# Patient Record
Sex: Male | Born: 1950 | Race: Black or African American | Hispanic: No | Marital: Single | Smoking: Never smoker
Health system: Southern US, Community
[De-identification: ages and names within clinical notes are randomized; demographics above are authoritative.]

## PROBLEM LIST (undated history)

## (undated) DIAGNOSIS — S46919A Strain of unspecified muscle, fascia and tendon at shoulder and upper arm level, unspecified arm, initial encounter: Secondary | ICD-10-CM

## (undated) DIAGNOSIS — M25519 Pain in unspecified shoulder: Secondary | ICD-10-CM

## (undated) DIAGNOSIS — R972 Elevated prostate specific antigen [PSA]: Secondary | ICD-10-CM

## (undated) DIAGNOSIS — E119 Type 2 diabetes mellitus without complications: Secondary | ICD-10-CM

## (undated) DIAGNOSIS — E663 Overweight: Secondary | ICD-10-CM

## (undated) DIAGNOSIS — Z85830 Personal history of malignant neoplasm of bone: Secondary | ICD-10-CM

## (undated) DIAGNOSIS — G4733 Obstructive sleep apnea (adult) (pediatric): Secondary | ICD-10-CM

## (undated) DIAGNOSIS — C419 Malignant neoplasm of bone and articular cartilage, unspecified: Secondary | ICD-10-CM

## (undated) DIAGNOSIS — E785 Hyperlipidemia, unspecified: Secondary | ICD-10-CM

## (undated) DIAGNOSIS — I10 Essential (primary) hypertension: Secondary | ICD-10-CM

## (undated) DIAGNOSIS — C61 Malignant neoplasm of prostate: Secondary | ICD-10-CM

## (undated) DIAGNOSIS — M79606 Pain in leg, unspecified: Secondary | ICD-10-CM

## (undated) HISTORY — DX: Personal history of malignant neoplasm of bone: Z85.830

## (undated) HISTORY — PX: APPENDECTOMY: SHX54

## (undated) HISTORY — DX: Obstructive sleep apnea (adult) (pediatric): G47.33

## (undated) HISTORY — DX: Elevated prostate specific antigen (PSA): R97.20

## (undated) HISTORY — DX: Malignant neoplasm of bone and articular cartilage, unspecified: C41.9

## (undated) HISTORY — DX: Pain in unspecified shoulder: M25.519

## (undated) HISTORY — DX: Type 2 diabetes mellitus without complications: E11.9

## (undated) HISTORY — DX: Hyperlipidemia, unspecified: E78.5

## (undated) HISTORY — DX: Pain in leg, unspecified: M79.606

## (undated) HISTORY — PX: PROSTATE BIOPSY: SHX241

## (undated) HISTORY — DX: Strain of unspecified muscle, fascia and tendon at shoulder and upper arm level, unspecified arm, initial encounter: S46.919A

## (undated) HISTORY — PX: FACIAL NERVE SURGERY: SHX630

## (undated) HISTORY — DX: Overweight: E66.3

---

## 1998-03-07 ENCOUNTER — Emergency Department (HOSPITAL_COMMUNITY): Admission: EM | Admit: 1998-03-07 | Discharge: 1998-03-07 | Payer: Self-pay | Admitting: *Deleted

## 2005-05-05 ENCOUNTER — Emergency Department (HOSPITAL_COMMUNITY): Admission: EM | Admit: 2005-05-05 | Discharge: 2005-05-05 | Payer: Self-pay | Admitting: Emergency Medicine

## 2007-04-29 ENCOUNTER — Emergency Department (HOSPITAL_COMMUNITY): Admission: EM | Admit: 2007-04-29 | Discharge: 2007-04-29 | Payer: Self-pay | Admitting: Emergency Medicine

## 2009-07-18 ENCOUNTER — Emergency Department (HOSPITAL_COMMUNITY): Admission: EM | Admit: 2009-07-18 | Discharge: 2009-07-18 | Payer: Self-pay | Admitting: Emergency Medicine

## 2010-04-24 ENCOUNTER — Ambulatory Visit: Payer: Self-pay | Admitting: Family Medicine

## 2010-04-24 ENCOUNTER — Emergency Department (HOSPITAL_COMMUNITY): Admission: EM | Admit: 2010-04-24 | Discharge: 2010-04-24 | Payer: Self-pay | Admitting: Emergency Medicine

## 2011-01-07 LAB — CBC
HCT: 45.1 % (ref 39.0–52.0)
Hemoglobin: 15.3 g/dL (ref 13.0–17.0)
MCH: 29.9 pg (ref 26.0–34.0)
MCV: 87.8 fL (ref 78.0–100.0)

## 2011-01-07 LAB — POCT CARDIAC MARKERS
CKMB, poc: <1 ng/mL — ABNORMAL LOW (ref 1.0–8.0)
Troponin i, poc: <0.05 ng/mL (ref 0.00–0.09)

## 2011-01-07 LAB — DIFFERENTIAL
Basophils Absolute: 0 10*3/uL (ref 0.0–0.1)
Basophils Relative: 1 % (ref 0–1)
Lymphs Abs: 1.1 10*3/uL (ref 0.7–4.0)
Neutro Abs: 2 10*3/uL (ref 1.7–7.7)
Neutrophils Relative %: 54 % (ref 43–77)

## 2011-01-07 LAB — COMPREHENSIVE METABOLIC PANEL
Albumin: 3.7 g/dL (ref 3.5–5.2)
Alkaline Phosphatase: 67 U/L (ref 39–117)
BUN: 12 mg/dL (ref 6–23)
GFR calc non Af Amer: >60 mL/min (ref >60)
Total Protein: 7.1 g/dL (ref 6.0–8.3)

## 2011-01-26 LAB — POCT I-STAT, CHEM 8
BUN: 12 mg/dL (ref 6–23)
Chloride: 101 mEq/L (ref 96–112)
Creatinine, Ser: 0.8 mg/dL (ref 0.4–1.5)
Glucose, Bld: 131 mg/dL — ABNORMAL HIGH (ref 70–99)
HCT: 51 % (ref 39.0–52.0)
Potassium: 4 mEq/L (ref 3.5–5.1)

## 2011-01-26 LAB — URINALYSIS, ROUTINE W REFLEX MICROSCOPIC
Glucose, UA: NEGATIVE mg/dL
Hgb urine dipstick: NEGATIVE
Ketones, ur: NEGATIVE mg/dL
Protein, ur: NEGATIVE mg/dL
Specific Gravity, Urine: 1.022 (ref 1.005–1.030)

## 2011-05-01 DIAGNOSIS — R7301 Impaired fasting glucose: Secondary | ICD-10-CM | POA: Insufficient documentation

## 2011-05-01 DIAGNOSIS — M2140 Flat foot [pes planus] (acquired), unspecified foot: Secondary | ICD-10-CM | POA: Insufficient documentation

## 2011-05-01 DIAGNOSIS — I10 Essential (primary) hypertension: Secondary | ICD-10-CM | POA: Insufficient documentation

## 2011-05-01 DIAGNOSIS — C419 Malignant neoplasm of bone and articular cartilage, unspecified: Secondary | ICD-10-CM | POA: Insufficient documentation

## 2011-05-01 DIAGNOSIS — E782 Mixed hyperlipidemia: Secondary | ICD-10-CM | POA: Insufficient documentation

## 2013-06-17 DIAGNOSIS — R220 Localized swelling, mass and lump, head: Secondary | ICD-10-CM | POA: Insufficient documentation

## 2013-06-17 DIAGNOSIS — Z85819 Personal history of malignant neoplasm of unspecified site of lip, oral cavity, and pharynx: Secondary | ICD-10-CM | POA: Insufficient documentation

## 2013-06-17 DIAGNOSIS — H02849 Edema of unspecified eye, unspecified eyelid: Secondary | ICD-10-CM | POA: Insufficient documentation

## 2013-06-18 ENCOUNTER — Emergency Department (HOSPITAL_COMMUNITY)
Admission: EM | Admit: 2013-06-18 | Discharge: 2013-06-18 | Disposition: A | Discharge status: Home / Self Care | Payer: Medicaid Other | Source: Home / Self Care | Attending: Family Medicine | Admitting: Family Medicine

## 2013-06-18 ENCOUNTER — Encounter (HOSPITAL_COMMUNITY): Payer: Self-pay | Admitting: *Deleted

## 2013-06-18 DIAGNOSIS — I10 Essential (primary) hypertension: Secondary | ICD-10-CM

## 2013-06-18 LAB — POCT I-STAT, CHEM 8
BUN: 16 mg/dL (ref 6–23)
Creatinine, Ser: 1.2 mg/dL (ref 0.50–1.35)
Hemoglobin: 17 g/dL (ref 13.0–17.0)
TCO2: 26 mmol/L (ref 0–100)

## 2013-06-18 MED ORDER — CLONIDINE HCL 0.1 MG PO TABS
ORAL_TABLET | ORAL | Status: AC
  Filled 2013-06-18: qty 1

## 2013-06-18 MED ORDER — CLONIDINE HCL 0.1 MG PO TABS: 0.1000 mg | ORAL_TABLET | Freq: Two times a day (BID) | ORAL | Status: DC

## 2013-06-18 MED ORDER — FUROSEMIDE 40 MG PO TABS
ORAL_TABLET | ORAL | Status: AC
  Filled 2013-06-18: qty 1

## 2013-06-18 MED ORDER — FUROSEMIDE 40 MG PO TABS
40.0000 mg | ORAL_TABLET | Freq: Once | ORAL | Status: AC
  Administered 2013-06-18: 40 mg via ORAL

## 2013-06-18 MED ORDER — CLONIDINE HCL 0.1 MG PO TABS
0.1000 mg | ORAL_TABLET | Freq: Every day | ORAL | Status: DC
  Administered 2013-06-18: 0.1 mg via ORAL

## 2013-06-18 NOTE — ED Provider Notes (Signed)
CSN: [id number]     Arrival date & time 06/18/13  1527 History   First MD Initiated Contact with Patient 06/18/13 1625     Chief Complaint  Patient presents with  . Hypertension   (Consider location/radiation/quality/duration/timing/severity/associated sxs/prior Treatment) Patient is a 62 y.o. male presenting with hypertension. The history is provided by the patient.  Hypertension This is a new problem. The current episode started yesterday (seen at [facility] hosp yest for f/u of bone cancer  and called today to have bp rechecked.). The problem has not changed since onset.Pertinent negatives include no chest pain, no abdominal pain and no shortness of breath.    No past medical history on file. Past Surgical History  Procedure Laterality Date  . Facial nerve surgery Right    No family history on file. History  Substance Use Topics  . Smoking status: Never Smoker   . Smokeless tobacco: Not on file  . Alcohol Use: Not on file    Review of Systems  Constitutional: Negative.   Respiratory: Negative for chest tightness and shortness of breath.   Cardiovascular: Negative for chest pain, palpitations and leg swelling.  Gastrointestinal: Negative for abdominal pain.    Allergies  Review of patient's allergies indicates no known allergies.  Home Medications   Current Outpatient Rx  Name  Route  Sig  Dispense  Refill  . cloNIDine (CATAPRES) 0.1 MG tablet   Oral   Take 1 tablet (0.1 mg total) by mouth 2 (two) times daily.   60 tablet   1    BP 185/90  Pulse 60  Temp(Src) 97.9 F (36.6 C) (Oral)  Resp 16  SpO2 100% Physical Exam  Nursing note and vitals reviewed. Constitutional: He is oriented to person, place, and time. He appears well-developed and well-nourished.  Neck: Normal range of motion. Neck supple.  Cardiovascular: Normal rate, regular rhythm, normal heart sounds and intact distal pulses.   Pulmonary/Chest: Effort normal and breath sounds normal.  Lymphadenopathy:     He has no cervical adenopathy.  Neurological: He is alert and oriented to person, place, and time.  Right facial weakness, scar on chin.  Skin: Skin is warm and dry.    ED Course  Procedures (including critical care time) Labs Review Labs Reviewed  POCT I-STAT, CHEM 8 - Abnormal; Notable for the following:    Glucose, Bld 112 (*)    All other components within normal limits   Imaging Review No results found.  MDM   1. Hypertension       Thomas Hoff, MD 06/18/13 1726

## 2013-06-18 NOTE — ED Notes (Addendum)
Pt states he has a history of high blood pressure and came in to get it checked. BP right arm 169/115. Jan Ranson, SMA

## 2014-05-14 ENCOUNTER — Emergency Department (HOSPITAL_COMMUNITY)
Admission: EM | Admit: 2014-05-14 | Discharge: 2014-05-14 | Disposition: A | Discharge status: Home / Self Care | Payer: Medicaid Other | Attending: Emergency Medicine | Admitting: Emergency Medicine

## 2014-05-14 ENCOUNTER — Encounter (HOSPITAL_COMMUNITY): Payer: Self-pay | Admitting: Emergency Medicine

## 2014-05-14 ENCOUNTER — Emergency Department (HOSPITAL_COMMUNITY): Payer: Medicaid Other

## 2014-05-14 DIAGNOSIS — I10 Essential (primary) hypertension: Secondary | ICD-10-CM | POA: Diagnosis not present

## 2014-05-14 DIAGNOSIS — R0789 Other chest pain: Secondary | ICD-10-CM | POA: Diagnosis not present

## 2014-05-14 DIAGNOSIS — Z79899 Other long term (current) drug therapy: Secondary | ICD-10-CM | POA: Diagnosis not present

## 2014-05-14 DIAGNOSIS — Z85830 Personal history of malignant neoplasm of bone: Secondary | ICD-10-CM | POA: Diagnosis not present

## 2014-05-14 LAB — CBC
HEMATOCRIT: 43.1 % (ref 39.0–52.0)
HEMOGLOBIN: 14.8 g/dL (ref 13.0–17.0)
MCH: 29.7 pg (ref 26.0–34.0)
MCHC: 34.3 g/dL (ref 30.0–36.0)
MCV: 86.5 fL (ref 78.0–100.0)
Platelets: 170 10*3/uL (ref 150–400)
RBC: 4.98 MIL/uL (ref 4.22–5.81)
RDW: 13.1 % (ref 11.5–15.5)
WBC: 4.2 10*3/uL (ref 4.0–10.5)

## 2014-05-14 LAB — BASIC METABOLIC PANEL
Anion gap: 14 (ref 5–15)
BUN: 15 mg/dL (ref 6–23)
CALCIUM: 8.8 mg/dL (ref 8.4–10.5)
CHLORIDE: 101 meq/L (ref 96–112)
CO2: 25 meq/L (ref 19–32)
CREATININE: 1.06 mg/dL (ref 0.50–1.35)
GFR calc Af Amer: 85 mL/min — ABNORMAL LOW (ref >90)
GFR calc non Af Amer: 73 mL/min — ABNORMAL LOW (ref >90)
GLUCOSE: 125 mg/dL — AB (ref 70–99)
Potassium: 3.8 mEq/L (ref 3.7–5.3)
Sodium: 140 mEq/L (ref 137–147)

## 2014-05-14 LAB — I-STAT TROPONIN, ED: TROPONIN I, POC: 0 ng/mL (ref 0.00–0.08)

## 2014-05-14 MED ORDER — ACETAMINOPHEN 500 MG PO TABS
1000.0000 mg | ORAL_TABLET | Freq: Once | ORAL | Status: AC
  Administered 2014-05-14: 1000 mg via ORAL
  Filled 2014-05-14: qty 2

## 2014-05-14 NOTE — Discharge Instructions (Signed)

## 2014-05-14 NOTE — ED Provider Notes (Signed)
TIME SEEN: 5:00 PM  CHIEF COMPLAINT: Atypical chest pain  HPI: Patient is a 63 y.o. M with history of cancer, hypertension who presents to the emergency department with complaints of right-sided chest pain without radiation that started at 10 AM after he got angry earlier today. He denies any associated shortness of breath, nausea or vomiting, diaphoresis or dizziness. States he is chest pain-free. The pain got better after he started relaxing. He denies that his pain improved with nitroglycerin despite contradiction and nursing note. Denies any fevers or cough. No lower extremity swelling or pain. History of stress test or cardiac catheterization. No history of MI. No history of PE or DVT.  ROS: See HPI Constitutional: no fever  Eyes: no drainage  ENT: no runny nose   Cardiovascular:  no chest pain  Resp: no SOB  GI: no vomiting GU: no dysuria Integumentary: no rash  Allergy: no hives  Musculoskeletal: no leg swelling  Neurological: no slurred speech ROS otherwise negative  PAST MEDICAL HISTORY/PAST SURGICAL HISTORY:  Past Medical History  Diagnosis Date  . Cancer 90s    bone    MEDICATIONS:  Prior to Admission medications   Medication Sig Start Date End Date Taking? Authorizing Provider  cloNIDine (CATAPRES) 0.1 MG tablet Take 1 tablet (0.1 mg total) by mouth 2 (two) times daily. 06/18/13   Billy Fischer, MD    ALLERGIES:  No Known Allergies  SOCIAL HISTORY:  History  Substance Use Topics  . Smoking status: Never Smoker   . Smokeless tobacco: Not on file  . Alcohol Use: No    FAMILY HISTORY: No family history on file.  EXAM: BP 155/85  Temp(Src) 98.5 F (36.9 C) (Oral)  Resp 24  Ht 5\' 6"  (1.676 m)  Wt 150 lb (68.04 kg)  BMI 24.22 kg/m2  SpO2 95% CONSTITUTIONAL: Alert and oriented and responds appropriately to questions. Well-appearing; well-nourished HEAD: Normocephalic EYES: Conjunctivae clear, PERRL ENT: normal nose; no rhinorrhea; moist mucous membranes;  pharynx without lesions noted NECK: Supple, no meningismus, no LAD  CARD: RRR; S1 and S2 appreciated; no murmurs, no clicks, no rubs, no gallops RESP: Normal chest excursion without splinting or tachypnea; breath sounds clear and equal bilaterally; no wheezes, no rhonchi, no rales,  ABD/GI: Normal bowel sounds; non-distended; soft, non-tender, no rebound, no guarding BACK:  The back appears normal and is non-tender to palpation, there is no CVA tenderness EXT: Normal ROM in all joints; non-tender to palpation; no edema; normal capillary refill; no cyanosis    SKIN: Normal color for age and race; warm NEURO: Moves all extremities equally PSYCH: The patient's mood and manner are appropriate. Grooming and personal hygiene are appropriate.  MEDICAL DECISION MAKING: Patient here with very atypical chest pain who is now chest pain-free. He started at 10 AM. EKG shows no new ischemic changes or arrhythmia. We will obtain cardiac labs but suspect normal workup with outpatient followup. Given he is chest pain-free I doubt dissection or pulmonary embolus. Patient verbalizes understanding and is comfortable with this plan.  ED PROGRESS: Patient is still chest pain-free. His workup in the emergency department is negative. Troponin negative. Chest x-ray clear. I feel he can be discharged home with close outpatient followup. He states his primary care provider is at [facility]. Have discussed with him return precautions and supportive care instructions. He verbalizes understanding and is comfortable plan.      EKG Interpretation  Date/Time:  Friday May 14 2014 16:38:52 EDT Ventricular Rate:  84 PR Interval:  152 QRS Duration: 94 QT Interval:  369 QTC Calculation: 436 R Axis:   27 Text Interpretation:  Sinus rhythm LAE, consider biatrial enlargement Probable left ventricular hypertrophy No significant change since last tracing Confirmed by Tristian Bouska,  DO, Devanee Pomplun [phone]) on 05/14/2014 4:54:35 PM          [place], DO 05/14/14 1934

## 2014-05-14 NOTE — ED Notes (Signed)
Per EMS - pt c/o CP that started this morning after he got "stressed out". Pt went to work but the pain never went away so he called ems. Upon arrival pain was 8/10. EMS started 18G in left AC. Administered 324 mg of ASA and 1 Nitro, pain decreased to 5/10. Pt now c/o HA. 12 lead unremarkable. Nad, skin warm and dry, resp e/u.

## 2014-05-14 NOTE — ED Notes (Signed)
Pt comfortable with discharge and follow up instructions. No prescriptions. 

## 2014-05-16 ENCOUNTER — Observation Stay (HOSPITAL_COMMUNITY)
Admission: EM | Admit: 2014-05-16 | Discharge: 2014-05-18 | Disposition: A | Discharge status: Home / Self Care | Payer: Medicaid Other | Attending: Internal Medicine | Admitting: Internal Medicine

## 2014-05-16 ENCOUNTER — Encounter (HOSPITAL_COMMUNITY): Payer: Self-pay | Admitting: Emergency Medicine

## 2014-05-16 ENCOUNTER — Emergency Department (HOSPITAL_COMMUNITY): Payer: Medicaid Other

## 2014-05-16 DIAGNOSIS — R0602 Shortness of breath: Secondary | ICD-10-CM | POA: Insufficient documentation

## 2014-05-16 DIAGNOSIS — Z85830 Personal history of malignant neoplasm of bone: Secondary | ICD-10-CM | POA: Insufficient documentation

## 2014-05-16 DIAGNOSIS — R0789 Other chest pain: Secondary | ICD-10-CM | POA: Diagnosis not present

## 2014-05-16 DIAGNOSIS — I10 Essential (primary) hypertension: Secondary | ICD-10-CM | POA: Insufficient documentation

## 2014-05-16 DIAGNOSIS — R079 Chest pain, unspecified: Secondary | ICD-10-CM | POA: Diagnosis present

## 2014-05-16 DIAGNOSIS — R7302 Impaired glucose tolerance (oral): Secondary | ICD-10-CM

## 2014-05-16 HISTORY — DX: Essential (primary) hypertension: I10

## 2014-05-16 LAB — TROPONIN I: Troponin I: <0.3 ng/mL (ref <0.30)

## 2014-05-16 LAB — CBC WITH DIFFERENTIAL/PLATELET
BASOS PCT: 1 % (ref 0–1)
Basophils Absolute: 0 10*3/uL (ref 0.0–0.1)
EOS ABS: 0.1 10*3/uL (ref 0.0–0.7)
Eosinophils Relative: 3 % (ref 0–5)
HEMATOCRIT: 41.7 % (ref 39.0–52.0)
HEMOGLOBIN: 14.5 g/dL (ref 13.0–17.0)
Lymphocytes Relative: 39 % (ref 12–46)
Lymphs Abs: 1.7 10*3/uL (ref 0.7–4.0)
MCH: 29.2 pg (ref 26.0–34.0)
MCHC: 34.8 g/dL (ref 30.0–36.0)
MCV: 83.9 fL (ref 78.0–100.0)
MONO ABS: 0.5 10*3/uL (ref 0.1–1.0)
MONOS PCT: 12 % (ref 3–12)
Neutro Abs: 2 10*3/uL (ref 1.7–7.7)
Neutrophils Relative %: 45 % (ref 43–77)
Platelets: 180 10*3/uL (ref 150–400)
RBC: 4.97 MIL/uL (ref 4.22–5.81)
RDW: 12.8 % (ref 11.5–15.5)
WBC: 4.4 10*3/uL (ref 4.0–10.5)

## 2014-05-16 LAB — BASIC METABOLIC PANEL
Anion gap: 11 (ref 5–15)
BUN: 13 mg/dL (ref 6–23)
CO2: 26 mEq/L (ref 19–32)
CREATININE: 1.1 mg/dL (ref 0.50–1.35)
Calcium: 8.8 mg/dL (ref 8.4–10.5)
Chloride: 103 mEq/L (ref 96–112)
GFR calc Af Amer: 81 mL/min — ABNORMAL LOW (ref >90)
GFR, EST NON AFRICAN AMERICAN: 70 mL/min — AB (ref >90)
Glucose, Bld: 109 mg/dL — ABNORMAL HIGH (ref 70–99)
Potassium: 4.2 mEq/L (ref 3.7–5.3)
Sodium: 140 mEq/L (ref 137–147)

## 2014-05-16 LAB — I-STAT TROPONIN, ED: Troponin i, poc: 0 ng/mL (ref 0.00–0.08)

## 2014-05-16 MED ORDER — AMLODIPINE BESYLATE 5 MG PO TABS
5.0000 mg | ORAL_TABLET | Freq: Every day | ORAL | Status: DC
  Administered 2014-05-17: 5 mg via ORAL
  Filled 2014-05-16 (×2): qty 1

## 2014-05-16 MED ORDER — MORPHINE SULFATE 4 MG/ML IJ SOLN
4.0000 mg | Freq: Once | INTRAMUSCULAR | Status: AC
  Administered 2014-05-16: 4 mg via INTRAVENOUS
  Filled 2014-05-16: qty 1

## 2014-05-16 MED ORDER — ASPIRIN EC 81 MG PO TBEC
81.0000 mg | DELAYED_RELEASE_TABLET | Freq: Every day | ORAL | Status: DC
  Administered 2014-05-17 – 2014-05-18 (×2): 81 mg via ORAL
  Filled 2014-05-16 (×2): qty 1

## 2014-05-16 MED ORDER — ACETAMINOPHEN 325 MG PO TABS
650.0000 mg | ORAL_TABLET | ORAL | Status: DC | PRN
  Administered 2014-05-17: 650 mg via ORAL
  Filled 2014-05-16: qty 2

## 2014-05-16 MED ORDER — HEPARIN SODIUM (PORCINE) 5000 UNIT/ML IJ SOLN
5000.0000 [IU] | Freq: Three times a day (TID) | INTRAMUSCULAR | Status: DC
  Filled 2014-05-16 (×7): qty 1

## 2014-05-16 MED ORDER — LISINOPRIL 10 MG PO TABS: 10.0000 mg | ORAL_TABLET | Freq: Every day | ORAL | Status: DC

## 2014-05-16 MED ORDER — ONDANSETRON HCL 4 MG/2ML IJ SOLN
4.0000 mg | Freq: Four times a day (QID) | INTRAMUSCULAR | Status: DC | PRN
  Administered 2014-05-17: 4 mg via INTRAVENOUS
  Filled 2014-05-16: qty 2

## 2014-05-16 MED ORDER — HYDRALAZINE HCL 20 MG/ML IJ SOLN: 10.0000 mg | INTRAMUSCULAR | Status: DC | PRN

## 2014-05-16 NOTE — ED Notes (Signed)
Pt to department via EMS- pt reports that he was seen here last Friday for chest pain. States last night the pain came back and was more intense, and then again this morning. Reports this afternoon the pain was unbearable. Denies any cardiac hx. 8/10. 324 ASA, 2 SL nitro, BP-270/148 to 170/103. Been off his BP medications for the past year. 18g LAC.

## 2014-05-16 NOTE — H&P (Addendum)
[facility] History and Physical  ANANTH FIALLOS YSA:[id number] DOB: 10/28/1950 DOA: 05/16/2014  Referring physician: EDP PCP: No PCP Per Patient   Chief Complaint: Chest pain   HPI: Thomas Bowman is a 63 y.o. male who presented to the ED on Friday with chest pain, was seen and discharged after negative troponins.  Since then he reports his chest pain was intermittent on Friday, and farily severe when he woke up today this morning.  It has been continuous since then.  Pain is of quality "dull pressure".  There is associated SOB.  No previous cardiac history, does have h/o HTN but not taking any meds right now.  Review of Systems: Systems reviewed.  As above, otherwise negative  Past Medical History  Diagnosis Date  . Cancer 90s    bone  . Hypertension    Past Surgical History  Procedure Laterality Date  . Facial nerve surgery Right   . Appendectomy     Social History:  reports that he has never smoked. He does not have any smokeless tobacco history on file. He reports that he does not drink alcohol or use illicit drugs.  No Known Allergies  History reviewed. No pertinent family history.   Prior to Admission medications   Not on File   Physical Exam: Filed Vitals:   05/16/14 2000  BP: 165/96  Pulse: 65  Temp:   Resp: 21    BP 165/96  Pulse 65  Temp(Src) 97.9 F (36.6 C) (Oral)  Resp 21  SpO2 97%  General Appearance:    Alert, oriented, no distress, appears stated age  Head:    Normocephalic, atraumatic  Eyes:    PERRL, EOMI, sclera non-icteric        Nose:   Nares without drainage or epistaxis. Mucosa, turbinates normal  Throat:   Moist mucous membranes. Oropharynx without erythema or exudate.  Neck:   Supple. No carotid bruits.  No thyromegaly.  No lymphadenopathy.   Back:     No CVA tenderness, no spinal tenderness  Lungs:     Clear to auscultation bilaterally, without wheezes, rhonchi or rales  Chest wall:    No tenderness to palpitation  Heart:     Regular rate and rhythm without murmurs, gallops, rubs  Abdomen:     Soft, non-tender, nondistended, normal bowel sounds, no organomegaly  Genitalia:    deferred  Rectal:    deferred  Extremities:   No clubbing, cyanosis or edema.  Pulses:   2+ and symmetric all extremities  Skin:   Skin color, texture, turgor normal, no rashes or lesions  Lymph nodes:   Cervical, supraclavicular, and axillary nodes normal  Neurologic:   CNII-XII intact. Normal strength, sensation and reflexes      throughout    Labs on Admission:  Basic Metabolic Panel:  Recent Labs Lab 05/14/14 1654 05/16/14 1930  NA 140 140  K 3.8 4.2  CL 101 103  CO2 25 26  GLUCOSE 125* 109*  BUN 15 13  CREATININE 1.06 1.10  CALCIUM 8.8 8.8   Liver Function Tests: No results found for this basename: AST, ALT, ALKPHOS, BILITOT, PROT, ALBUMIN,  in the last 168 hours No results found for this basename: LIPASE, AMYLASE,  in the last 168 hours No results found for this basename: AMMONIA,  in the last 168 hours CBC:  Recent Labs Lab 05/14/14 1654 05/16/14 1930  WBC 4.2 4.4  NEUTROABS  --  2.0  HGB 14.8 14.5  HCT 43.1 41.7  MCV 86.5 83.9  PLT 170 180   Cardiac Enzymes: No results found for this basename: CKTOTAL, CKMB, CKMBINDEX, TROPONINI,  in the last 168 hours  BNP (last 3 results) No results found for this basename: PROBNP,  in the last 8760 hours CBG: No results found for this basename: GLUCAP,  in the last 168 hours  Radiological Exams on Admission: Dg Chest Portable 1 View  05/16/2014   CLINICAL DATA:  Chest pain.  EXAM: PORTABLE CHEST - 1 VIEW  COMPARISON:  05/14/2014  FINDINGS: Heart size and pulmonary vascularity are normal and the lungs are clear. No osseous abnormality. No effusions.  IMPRESSION: Normal exam.   Electronically Signed   By: Rozetta Nunnery M.D.   On: 05/16/2014 19:54    EKG: Independently reviewed.  Probable LVH.  Assessment/Plan Principal Problem:   Chest pain Active Problems:    HTN (hypertension)   1. Chest pain - HEART score 4, serial trops, heart monitor, NPO after midnight for possible stress test in AM.  Also have ordered 2d echo given the probable LVH on his EKG.  Suspect LVH most likely due to his longstanding untreated HTN. 2. HTN - uncontrolled HTN, BPs running 180s here in ED, starting patient on 5mg  norvasc daily and using hydralazine PRN on floor for BP control.    Code Status: Full Code  Family Communication: No family in room Disposition Plan: Admit to inpatient   Time spent: 50 min  GARDNER, JARED M. [facility] Pager [phone]  If 7AM-7PM, please contact the day team taking care of the patient [email or web address] Password [facility] 05/16/2014, 9:01 PM

## 2014-05-16 NOTE — ED Notes (Signed)
PA at bedside.

## 2014-05-16 NOTE — ED Provider Notes (Signed)
CSN: [id number]     Arrival date & time 05/16/14  1849 History   First MD Initiated Contact with Patient 05/16/14 1851     Chief Complaint  Patient presents with  . Chest Pain     (Consider location/radiation/quality/duration/timing/severity/associated sxs/prior Treatment) HPI Comments: Patient presents emergency department with chief complaint of chest pain. He states that he was seen here last Friday for chest pain. States upon discharge, the pain improved, however he came back this morning. He states the pain is 8/10. It is in the center of his chest. He describes it as a dull pressure. He reports associated shortness of breath, but denies any diaphoresis. Denies any cardiac history, denies any history of DVT or PE. Cardiac risk factors are hypertension age of only. He is taking aspirin as well as 2 sublingual nitroglycerin with good improvement, but he states that he can still feel the pain now.  The history is provided by the patient. No language interpreter was used.    Past Medical History  Diagnosis Date  . Cancer 90s    bone  . Hypertension    Past Surgical History  Procedure Laterality Date  . Facial nerve surgery Right   . Appendectomy     History reviewed. No pertinent family history. History  Substance Use Topics  . Smoking status: Never Smoker   . Smokeless tobacco: Not on file  . Alcohol Use: No    Review of Systems  Constitutional: Negative for fever and chills.  Respiratory: Positive for shortness of breath.   Cardiovascular: Positive for chest pain.  Gastrointestinal: Negative for nausea, vomiting, diarrhea and constipation.  Genitourinary: Negative for dysuria.  All other systems reviewed and are negative.     Allergies  Review of patient's allergies indicates no known allergies.  Home Medications   Prior to Admission medications   Not on File   BP 147/84  Pulse 69  Temp(Src) 97.9 F (36.6 C) (Oral)  Resp 18  SpO2 96% Physical Exam   Nursing note and vitals reviewed. Constitutional: He is oriented to person, place, and time. He appears well-developed and well-nourished.  HENT:  Head: Normocephalic and atraumatic.  Eyes: Conjunctivae and EOM are normal. Pupils are equal, round, and reactive to light. Right eye exhibits no discharge. Left eye exhibits no discharge. No scleral icterus.  Neck: Normal range of motion. Neck supple. No JVD present.  Cardiovascular: Normal rate, regular rhythm and normal heart sounds.  Exam reveals no gallop and no friction rub.   No murmur heard. Pulmonary/Chest: Effort normal and breath sounds normal. No respiratory distress. He has no wheezes. He has no rales. He exhibits no tenderness.  Clear to auscultation bilaterally  Abdominal: Soft. He exhibits no distension and no mass. There is no tenderness. There is no rebound and no guarding.  Musculoskeletal: Normal range of motion. He exhibits no edema and no tenderness.  Neurological: He is alert and oriented to person, place, and time.  Skin: Skin is warm and dry.  Psychiatric: He has a normal mood and affect. His behavior is normal. Judgment and thought content normal.    ED Course  Procedures (including critical care time) Results for orders placed during the hospital encounter of 05/16/14  CBC WITH DIFFERENTIAL      Result Value Ref Range   WBC 4.4  4.0 - 10.5 K/uL   RBC 4.97  4.22 - 5.81 MIL/uL   Hemoglobin 14.5  13.0 - 17.0 g/dL   HCT 41.7  39.0 - 52.0 %  MCV 83.9  78.0 - 100.0 fL   MCH 29.2  26.0 - 34.0 pg   MCHC 34.8  30.0 - 36.0 g/dL   RDW 12.8  11.5 - 15.5 %   Platelets 180  150 - 400 K/uL   Neutrophils Relative % 45  43 - 77 %   Neutro Abs 2.0  1.7 - 7.7 K/uL   Lymphocytes Relative 39  12 - 46 %   Lymphs Abs 1.7  0.7 - 4.0 K/uL   Monocytes Relative 12  3 - 12 %   Monocytes Absolute 0.5  0.1 - 1.0 K/uL   Eosinophils Relative 3  0 - 5 %   Eosinophils Absolute 0.1  0.0 - 0.7 K/uL   Basophils Relative 1  0 - 1 %    Basophils Absolute 0.0  0.0 - 0.1 K/uL  BASIC METABOLIC PANEL      Result Value Ref Range   Sodium 140  137 - 147 mEq/L   Potassium 4.2  3.7 - 5.3 mEq/L   Chloride 103  96 - 112 mEq/L   CO2 26  19 - 32 mEq/L   Glucose, Bld 109 (*) 70 - 99 mg/dL   BUN 13  6 - 23 mg/dL   Creatinine, Ser 1.10  0.50 - 1.35 mg/dL   Calcium 8.8  8.4 - 10.5 mg/dL   GFR calc non Af Amer 70 (*) >90 mL/min   GFR calc Af Amer 81 (*) >90 mL/min   Anion gap 11  5 - 15  I-STAT TROPOININ, ED      Result Value Ref Range   Troponin i, poc 0.00  0.00 - 0.08 ng/mL   Comment 3            Dg Chest 2 View  05/14/2014   CLINICAL DATA:  Right-sided chest pain  EXAM: CHEST  2 VIEW  COMPARISON:  04/24/2010  FINDINGS: The lungs are clear without focal infiltrate, edema, pneumothorax or pleural effusion. The cardio pericardial silhouette is enlarged. Imaged bony structures of the thorax are intact.  IMPRESSION: No acute cardiopulmonary findings.   Electronically Signed   By: Misty Stanley M.D.   On: 05/14/2014 18:53   Dg Chest Portable 1 View  05/16/2014   CLINICAL DATA:  Chest pain.  EXAM: PORTABLE CHEST - 1 VIEW  COMPARISON:  05/14/2014  FINDINGS: Heart size and pulmonary vascularity are normal and the lungs are clear. No osseous abnormality. No effusions.  IMPRESSION: Normal exam.   Electronically Signed   By: Rozetta Nunnery M.D.   On: 05/16/2014 19:54     Imaging Review No results found.   EKG Interpretation   Date/Time:  Sunday May 16 2014 18:59:10 EDT Ventricular Rate:  69 PR Interval:  152 QRS Duration: 87 QT Interval:  375 QTC Calculation: 402 R Axis:   30 Text Interpretation:  Sinus rhythm Left atrial enlargement Probable left  ventricular hypertrophy Similar to prior Confirmed by Mingo Amber  MD, [place]  ([id number]) on 05/16/2014 7:15:32 PM      MDM   Final diagnoses:  Chest pain, unspecified chest pain type   Patient with chest pain. He was seen on Friday for the same, and discharged home in good condition.  States the pain returned this morning. He was given aspirin and nitroglycerin by EMS. He states this helps. He mass found his blood pressure be 270/148. It is now between the 140s and 160s. He has had 2 doses of nitroglycerin. Cardiac risk factors are hypertension age.  No history of PE or DVT. Patient seen by and discussed with Dr. Mingo Amber.  Labs and cxr pending.  9:16 PM Plan for admission.  Chest pain rule out.  Patient to be admitted by [facility].  Montine Circle, PA-C 05/16/14 2117

## 2014-05-16 NOTE — ED Provider Notes (Signed)
Medical screening examination/treatment/procedure(s) were conducted as a shared visit with non-physician practitioner(s) and myself.  I personally evaluated the patient during the encounter.   EKG Interpretation   Date/Time:  Sunday May 16 2014 18:59:10 EDT Ventricular Rate:  69 PR Interval:  152 QRS Duration: 87 QT Interval:  375 QTC Calculation: 402 R Axis:   30 Text Interpretation:  Sinus rhythm Left atrial enlargement Probable left  ventricular hypertrophy Similar to prior Confirmed by Mingo Amber  MD, [place]  ([id number]) on 05/16/2014 7:15:32 PM      Patient here with chest pain, heaviness, nonradiating. Seen 2 days ago for sharp CP - sent home with normal labs. EKG ok here. Initial BP with EMS in high 200s, then improved. No mediastinal widening, no tearing type pain, doubt aortic dissection. Pain improving with NTG. Stable for discharge.  Osvaldo Shipper, MD 05/16/14 [phone]

## 2014-05-17 ENCOUNTER — Observation Stay (HOSPITAL_COMMUNITY): Payer: Medicaid Other

## 2014-05-17 ENCOUNTER — Encounter (HOSPITAL_COMMUNITY): Payer: Self-pay | Admitting: *Deleted

## 2014-05-17 DIAGNOSIS — R7302 Impaired glucose tolerance (oral): Secondary | ICD-10-CM

## 2014-05-17 DIAGNOSIS — R079 Chest pain, unspecified: Secondary | ICD-10-CM

## 2014-05-17 DIAGNOSIS — R7309 Other abnormal glucose: Secondary | ICD-10-CM

## 2014-05-17 DIAGNOSIS — I10 Essential (primary) hypertension: Secondary | ICD-10-CM

## 2014-05-17 LAB — D-DIMER, QUANTITATIVE (NOT AT ARMC): D DIMER QUANT: 0.79 ug{FEU}/mL — AB (ref 0.00–0.48)

## 2014-05-17 LAB — LIPID PANEL
Cholesterol: 178 mg/dL (ref 0–200)
HDL: 45 mg/dL (ref >39)
LDL Cholesterol: 112 mg/dL — ABNORMAL HIGH (ref 0–99)
Total CHOL/HDL Ratio: 4 RATIO
Triglycerides: 104 mg/dL (ref <150)
VLDL: 21 mg/dL (ref 0–40)

## 2014-05-17 LAB — HEMOGLOBIN A1C
Hgb A1c MFr Bld: 6.2 % — ABNORMAL HIGH (ref <5.7)
Mean Plasma Glucose: 131 mg/dL — ABNORMAL HIGH (ref <117)

## 2014-05-17 LAB — TROPONIN I

## 2014-05-17 MED ORDER — HYDRALAZINE HCL 20 MG/ML IJ SOLN
INTRAMUSCULAR | Status: AC
  Filled 2014-05-17: qty 1

## 2014-05-17 MED ORDER — PROMETHAZINE HCL 25 MG/ML IJ SOLN
12.5000 mg | Freq: Four times a day (QID) | INTRAMUSCULAR | Status: DC | PRN
  Filled 2014-05-17: qty 1

## 2014-05-17 MED ORDER — TECHNETIUM TC 99M SESTAMIBI GENERIC - CARDIOLITE
30.0000 | Freq: Once | INTRAVENOUS | Status: AC | PRN
  Administered 2014-05-17: 30 via INTRAVENOUS

## 2014-05-17 MED ORDER — UNABLE TO FIND: Status: DC

## 2014-05-17 MED ORDER — REGADENOSON 0.4 MG/5ML IV SOLN
INTRAVENOUS | Status: AC
  Filled 2014-05-17: qty 5

## 2014-05-17 MED ORDER — IOHEXOL 350 MG/ML SOLN
100.0000 mL | Freq: Once | INTRAVENOUS | Status: AC | PRN
  Administered 2014-05-17: 100 mL via INTRAVENOUS

## 2014-05-17 MED ORDER — HYDRALAZINE HCL 20 MG/ML IJ SOLN
10.0000 mg | Freq: Once | INTRAMUSCULAR | Status: AC
  Administered 2014-05-17: 10 mg via INTRAVENOUS

## 2014-05-17 MED ORDER — OXYCODONE HCL 5 MG PO TABS
5.0000 mg | ORAL_TABLET | ORAL | Status: DC | PRN
  Administered 2014-05-17: 5 mg via ORAL
  Filled 2014-05-17: qty 1

## 2014-05-17 MED ORDER — SODIUM CHLORIDE 0.9 % IV SOLN
INTRAVENOUS | Status: DC
  Administered 2014-05-17: 19:00:00 via INTRAVENOUS

## 2014-05-17 MED ORDER — TECHNETIUM TC 99M SESTAMIBI GENERIC - CARDIOLITE
10.0000 | Freq: Once | INTRAVENOUS | Status: AC | PRN
  Administered 2014-05-17: 10 via INTRAVENOUS

## 2014-05-17 NOTE — Discharge Summary (Signed)
Physician Discharge Summary  Thomas Bowman:[id number] DOB: 1951-03-31 DOA: 05/16/2014  PCP: No PCP Per Patient  Admit date: 05/16/2014 Discharge date: 05/18/2014  Recommendations for Outpatient Follow-up:  1. Pt will need to follow up with PCP in 2 weeks post discharge 2. Please obtain BMP 1 week Discharge Diagnoses:  Atypical chest pain  -Troponins negative  -EKG without ischemic changes  -Chest discomfort is reproducible on palpation  -Patient seen by cardiology -Given the patient's history of head and neck cancer--check d-dimer -->0.79 -Continue aspirin  -Myoview negative for inducible ischemia, 64% EF Hypertension  -Amlodipine has been started--home with amlodipine 10 mg daily -Check lipid panel- LDL 112 Impaired glucose tolerance -Hemoglobin A1c 6.2 -Discussed lifestyle modification Elevated d-dimer -Given the patient's history of head and neck cancer as well as unremitting chest discomfort, CT angiogram of the chest was obtained -CTA chest--neg for PE  Discharge Condition: Stable  Disposition:  discharge home  Diet: Heart healthy Wt Readings from Last 3 Encounters:  05/18/14 81.647 kg (180 lb)  05/14/14 68.04 kg (150 lb)    History of present illness:  63 year old male with a history of hypertension and head and neck cancer developed right-sided chest discomfort on 05/14/2014. The patient came to the emergency department where his workup was unremarkable and he was sent home. The patient developed chest discomfort again on 05/15/2014 on 05/16/2014. He states that the chest discomfort was not associated with exertion. He states that he actually woke up on the morning of 05/16/2049 with chest discomfort. He did have some intermittent shortness of breath. He denied any dizziness, nausea, vomiting, diaphoresis. The patient relates a lot of stress at work. There is no family history of cardiac disease. EKG was sinus rhythm without ST changes. Chest x-ray was negative.  Troponins were negative. The patient was noted to be hypertensive and was started on amlodipine.  Myoview stress test was obtained and was negative for inducible ischemia, ejection fraction 64%. Because of the patient's history of head and neck cancer d-dimer was checked and was elevated. CT angiogram of the chest was obtained and was negative.    Consultants: Cardiology  Discharge Exam: Filed Vitals:   05/18/14 0558  BP: 151/78  Pulse: 58  Temp: 97.8 F (36.6 C)  Resp: 18   Filed Vitals:   05/17/14 1307 05/17/14 1600 05/17/14 2015 05/18/14 0558  BP: 172/78 144/82 154/90 151/78  Pulse:  78 60 58  Temp:  97.8 F (36.6 C) 97.7 F (36.5 C) 97.8 F (36.6 C)  TempSrc:  Oral Oral Oral  Resp:  16 18 18   Height:      Weight:    81.647 kg (180 lb)  SpO2:  100% 100% 97%   General: A&O x 3, NAD, pleasant, cooperative Cardiovascular: RRR, no rub, no gallop, no S3 Respiratory: CTAB, no wheeze, no rhonchi Abdomen:soft, nontender, nondistended, positive bowel sounds Extremities: No edema, No lymphangitis, no petechiae  Discharge Instructions      Discharge Instructions   Diet - low sodium heart healthy    Complete by:  As directed      Discharge instructions    Complete by:  As directed   Take baby aspiriin 81mg  daily     Increase activity slowly    Complete by:  As directed             Medication List         amLODipine 10 MG tablet  Commonly known as:  NORVASC  Take 1 tablet (10  mg total) by mouth daily.     aspirin 81 MG EC tablet  Take 1 tablet (81 mg total) by mouth daily.     UNABLE TO FIND  Mr. Thomas Bowman is presently hospitalized which started 05/17/14         The results of significant diagnostics from this hospitalization (including imaging, microbiology, ancillary and laboratory) are listed below for reference.    Significant Diagnostic Studies: Dg Chest 2 View  05/14/2014   CLINICAL DATA:  Right-sided chest pain  EXAM: CHEST  2 VIEW  COMPARISON:   04/24/2010  FINDINGS: The lungs are clear without focal infiltrate, edema, pneumothorax or pleural effusion. The cardio pericardial silhouette is enlarged. Imaged bony structures of the thorax are intact.  IMPRESSION: No acute cardiopulmonary findings.   Electronically Signed   By: Misty Stanley M.D.   On: 05/14/2014 18:53   Ct Angio Chest Pe W/cm &/or Wo Cm  05/17/2014   CLINICAL DATA:  Chest pain and shortness of breath.  EXAM: CT ANGIOGRAPHY CHEST WITH CONTRAST  TECHNIQUE: Multidetector CT imaging of the chest was performed using the standard protocol during bolus administration of intravenous contrast. Multiplanar CT image reconstructions and MIPs were obtained to evaluate the vascular anatomy.  CONTRAST:  80 mL OMNIPAQUE IOHEXOL 350 MG/ML SOLN  COMPARISON:  CT chest 04/24/2010.  FINDINGS: No pulmonary embolus is identified. There is no axillary, hilar or mediastinal lymphadenopathy. Heart size is mildly enlarged. No pleural or pericardial effusion. The lungs are clear. Incidentally imaged abdominal contents are unremarkable. No focal bony abnormality is identified.  Review of the MIP images confirms the above findings.  IMPRESSION: Negative for pulmonary embolus.  No acute disease.  Mild cardiomegaly.   Electronically Signed   By: Inge Rise M.D.   On: 05/17/2014 18:38   Nm Myocar Multi W/spect W/wall Motion / Ef  05/17/2014   CLINICAL DATA:  Chest pain and hypertension.  EXAM: MYOCARDIAL IMAGING WITH SPECT (REST AND EXERCISE)  GATED LEFT VENTRICULAR WALL MOTION STUDY  LEFT VENTRICULAR EJECTION FRACTION  TECHNIQUE: Standard myocardial SPECT imaging was performed after resting intravenous injection of 10 mCi Tc-48m sestamibi. Subsequently, exercise tolerance test was performed by the patient under the supervision of the Cardiology staff. At peak-stress, 30 mCi Tc-63m sestamibi was injected intravenously and standard myocardial SPECT imaging was performed. Quantitative gated imaging was also performed  to evaluate left ventricular wall motion, and estimate left ventricular ejection fraction.  COMPARISON:  One-view chest x-ray from the same day.  FINDINGS: Myocardial uptake is within normal limits. There are no fixed reversible defects.  Gated imaging demonstrates normal contractility and wall thickening.  The estimated diastolic volume on gated imaging is 162 mL. The estimated systolic volume is 65 mL. The calculated ejection fraction is 64%.  IMPRESSION: 1. No evidence for inducible ischemia. 2. Normal contractility. 3. Normal ejection fraction of 64%.   Electronically Signed   By: Lawrence Santiago M.D.   On: 05/17/2014 16:14   Dg Chest Portable 1 View  05/16/2014   CLINICAL DATA:  Chest pain.  EXAM: PORTABLE CHEST - 1 VIEW  COMPARISON:  05/14/2014  FINDINGS: Heart size and pulmonary vascularity are normal and the lungs are clear. No osseous abnormality. No effusions.  IMPRESSION: Normal exam.   Electronically Signed   By: Rozetta Nunnery M.D.   On: 05/16/2014 19:54     Microbiology: No results found for this or any previous visit (from the past 240 hour(s)).   Labs: Basic Metabolic Panel:  Recent Labs Lab 05/14/14 1654 05/16/14 1930  NA 140 140  K 3.8 4.2  CL 101 103  CO2 25 26  GLUCOSE 125* 109*  BUN 15 13  CREATININE 1.06 1.10  CALCIUM 8.8 8.8   Liver Function Tests: No results found for this basename: AST, ALT, ALKPHOS, BILITOT, PROT, ALBUMIN,  in the last 168 hours No results found for this basename: LIPASE, AMYLASE,  in the last 168 hours No results found for this basename: AMMONIA,  in the last 168 hours CBC:  Recent Labs Lab 05/14/14 1654 05/16/14 1930  WBC 4.2 4.4  NEUTROABS  --  2.0  HGB 14.8 14.5  HCT 43.1 41.7  MCV 86.5 83.9  PLT 170 180   Cardiac Enzymes:  Recent Labs Lab 05/16/14 2223 05/17/14 0330 05/17/14 0815  TROPONINI <0.30 <0.30 <0.30   BNP: No components found with this basename: POCBNP,  CBG: No results found for this basename: GLUCAP,  in  the last 168 hours  Time coordinating discharge:  Greater than 30 minutes  Signed:  Ashantae Pangallo, DO [facility] Pager: [phone] 05/18/2014, 7:42 AM

## 2014-05-17 NOTE — Progress Notes (Signed)
Patient c/o of headache 10/10 with nausea and vomiting since return from stress test, Tylenol given at 1847 and Zofran given at 1854 with no relief. NP K.Kirby made aware, orders given for oxycodone IR 5mg  and 1 x dose of phenegran. Will continue to monitor patient.

## 2014-05-17 NOTE — Progress Notes (Signed)
The patient tolerated the treadmill very well.  No CP.  Avalin Briley PA-C

## 2014-05-17 NOTE — Consult Note (Addendum)
Admit date: 05/16/2014 Referring Physician  Dr. Carles Collet Primary Physician NONE Primary Cardiologist  None Reason for Consultation  CP  HPI: HPI: Thomas Bowman is a 63 y.o. male with h/o HTN who presented to the ED on Friday with chest pain and was seen and discharged after negative troponins. The pain at that time was described as a pins and needles sensation over his right chest.  Since then he reports hischest pain changed and became a pressure over his chest that was intermittent on Friday, and fairly severe when he woke up Sunday morning. It has been continuous since then and he presented back to the ER last night.  Pain is of quality "dull pressure". There is associated SOB. No previous cardiac history, does have h/o HTN but not taking any meds right now.    PMH:   Past Medical History  Diagnosis Date  . Cancer 90s    bone  . Hypertension      PSH:   Past Surgical History  Procedure Laterality Date  . Facial nerve surgery Right   . Appendectomy      Allergies:  Review of patient's allergies indicates no known allergies. Prior to Admit Meds:   No prescriptions prior to admission   Fam HX:    Family History  Problem Relation Age of Onset  . Cancer Mother   . Cancer Father    Social HX:    History   Social History  . Marital Status: Married    Spouse Name: N/A    Number of Children: N/A  . Years of Education: N/A   Occupational History  . Not on file.   Social History Main Topics  . Smoking status: Never Smoker   . Smokeless tobacco: Not on file  . Alcohol Use: No  . Drug Use: No  . Sexual Activity: Not on file   Other Topics Concern  . Not on file   Social History Narrative  . No narrative on file     ROS:  All 11 ROS were addressed and are negative except what is stated in the HPI  Physical Exam: Blood pressure 163/87, pulse 53, temperature 97.4 F (36.3 C), temperature source Oral, resp. rate 18, height 5\' 6"  (1.676 m), weight 179 lb 14.3 oz (81.6 kg),  SpO2 99.00%.    General: Well developed, well nourished, in no acute distress Head: Eyes PERRLA, No xanthomas.   Normal cephalic and atramatic  Lungs:   Clear bilaterally to auscultation and percussion. Heart:   HRRR S1 S2 Pulses are 2+ & equal.            No carotid bruit. No JVD.  No abdominal bruits. No femoral bruits. Abdomen: Bowel sounds are positive, abdomen soft and non-tender without masses Extremities:   No clubbing, cyanosis or edema.  DP +1 Neuro: Alert and oriented X 3. Psych:  Good affect, responds appropriately    Labs:   Lab Results  Component Value Date   WBC 4.4 05/16/2014   HGB 14.5 05/16/2014   HCT 41.7 05/16/2014   MCV 83.9 05/16/2014   PLT 180 05/16/2014     Recent Labs Lab 05/16/14 1930  NA 140  K 4.2  CL 103  CO2 26  BUN 13  CREATININE 1.10  CALCIUM 8.8  GLUCOSE 109*   No results found for this basename: PTT   No results found for this basename: INR,  PROTIME   Lab Results  Component Value Date   TROPONINI <0.30 05/17/2014  No results found for this basename: CHOL   No results found for this basename: HDL   No results found for this basename: LDLCALC   No results found for this basename: TRIG   No results found for this basename: CHOLHDL   No results found for this basename: LDLDIRECT      Radiology:  Dg Chest Portable 1 View  05/16/2014   CLINICAL DATA:  Chest pain.  EXAM: PORTABLE CHEST - 1 VIEW  COMPARISON:  05/14/2014  FINDINGS: Heart size and pulmonary vascularity are normal and the lungs are clear. No osseous abnormality. No effusions.  IMPRESSION: Normal exam.   Electronically Signed   By: Rozetta Nunnery M.D.   On: 05/16/2014 19:54    EKG:  NSR with LAE  ASSESSMENT:  1.  Chest pain typical and atypical components with normal EKG and normal cardiac enzymes.  This is his second admission over the weekend for CP.  His CRF include male sex, age and HTN. 2.  HTN  PLAN:  .   1.  NPO 2.  Nuclear stress myoview to rule out  ischemia  Sueanne Margarita, MD  05/17/2014  8:34 AM

## 2014-05-17 NOTE — Progress Notes (Signed)
[place] for DC from cardiology standpoint.  No ischemia on treadmill myovue.  Alexcis Bicking PA-C

## 2014-05-17 NOTE — Progress Notes (Signed)
PROGRESS NOTE  Thomas Bowman PPI:[id number] DOB: 1951/06/01 DOA: 05/16/2014 PCP: No PCP Per Patient  Interim history 63 year old male with a history of hypertension and head and neck cancer developed right-sided chest discomfort on 05/14/2014. The patient came to the emergency department where his workup was unremarkable and he was sent home. The patient developed chest discomfort again on 05/15/2014 on 05/16/2014. He states that the chest discomfort was not associated with exertion. He states that he actually woke up on the morning of 05/16/2049 with chest discomfort. He did have some intermittent shortness of breath. He denied any dizziness, nausea, vomiting, diaphoresis. The patient relates a lot of stress at work. There is no family history of cardiac disease. EKG was sinus rhythm without ST changes. Chest x-ray was negative. Troponins were negative. The patient was noted to be hypertensive and was started on amlodipine.   Assessment/Plan: Atypical chest pain -Troponins negative -EKG without ischemic changes -Chest discomfort is reproducible on palpation -Patient to be seen by cardiology this morning -Given the patient's history of head and neck cancer--check d-dimer -Continue aspirin Hypertension -Amlodipine has been started -Check lipid panel- -check hemoglobin A1c-   Family Communication:   Pt at beside Disposition Plan:   Home when medically stable      Procedures/Studies: Dg Chest 2 View  05/14/2014   CLINICAL DATA:  Right-sided chest pain  EXAM: CHEST  2 VIEW  COMPARISON:  04/24/2010  FINDINGS: The lungs are clear without focal infiltrate, edema, pneumothorax or pleural effusion. The cardio pericardial silhouette is enlarged. Imaged bony structures of the thorax are intact.  IMPRESSION: No acute cardiopulmonary findings.   Electronically Signed   By: Misty Stanley M.D.   On: 05/14/2014 18:53   Dg Chest Portable 1 View  05/16/2014   CLINICAL DATA:  Chest pain.   EXAM: PORTABLE CHEST - 1 VIEW  COMPARISON:  05/14/2014  FINDINGS: Heart size and pulmonary vascularity are normal and the lungs are clear. No osseous abnormality. No effusions.  IMPRESSION: Normal exam.   Electronically Signed   By: Rozetta Nunnery M.D.   On: 05/16/2014 19:54         Subjective:  patient states that he has minimal chest discomfort this morning. Denies any fevers, chills, coughing, hemoptysis, nausea, vomiting, diarrhea, abdominal pain. No dysuria or hematuria.  Objective: Filed Vitals:   05/16/14 2130 05/16/14 2209 05/17/14 0000 05/17/14 0400  BP: 171/94 188/97 191/89 163/87  Pulse: 51 51 56 53  Temp:  97.6 F (36.4 C) 97.6 F (36.4 C) 97.4 F (36.3 C)  TempSrc:  Oral Oral Oral  Resp: 16 18 18 18   Height:  5\' 6"  (1.676 m)    Weight:  81.647 kg (180 lb)  81.6 kg (179 lb 14.3 oz)  SpO2: 97% 100% 100% 99%   No intake or output data in the 24 hours ending 05/17/14 0739 Weight change:  Exam:   General:  Pt is alert, follows commands appropriately, not in acute distress  HEENT: No icterus, No thrush,  [place]/AT  Cardiovascular: RRR, S1/S2, no rubs, no gallops  Respiratory: CTA bilaterally, no wheezing, no crackles, no rhonchi  Abdomen: Soft/+BS, non tender, non distended, no guarding  Extremities: No edema, No lymphangitis, No petechiae, No rashes, no synovitis  Data Reviewed: Basic Metabolic Panel:  Recent Labs Lab 05/14/14 1654 05/16/14 1930  NA 140 140  K 3.8 4.2  CL 101 103  CO2 25 26  GLUCOSE 125* 109*  BUN 15 13  CREATININE 1.06 1.10  CALCIUM 8.8 8.8   Liver Function Tests: No results found for this basename: AST, ALT, ALKPHOS, BILITOT, PROT, ALBUMIN,  in the last 168 hours No results found for this basename: LIPASE, AMYLASE,  in the last 168 hours No results found for this basename: AMMONIA,  in the last 168 hours CBC:  Recent Labs Lab 05/14/14 1654 05/16/14 1930  WBC 4.2 4.4  NEUTROABS  --  2.0  HGB 14.8 14.5  HCT 43.1 41.7  MCV  86.5 83.9  PLT 170 180   Cardiac Enzymes:  Recent Labs Lab 05/16/14 2223 05/17/14 0330  TROPONINI <0.30 <0.30   BNP: No components found with this basename: POCBNP,  CBG: No results found for this basename: GLUCAP,  in the last 168 hours  No results found for this or any previous visit (from the past 240 hour(s)).   Scheduled Meds: . amLODipine  5 mg Oral Daily  . aspirin EC  81 mg Oral Daily  . heparin  5,000 Units Subcutaneous 3 times per day   Continuous Infusions:    Damarkus Balis, DO  [facility] Pager [phone]  If 7PM-7AM, please contact night-coverage [email or web address] Password [facility] 05/17/2014, 7:39 AM   LOS: 1 day

## 2014-05-17 NOTE — Progress Notes (Signed)
Utilization review completed.  

## 2014-05-18 MED ORDER — AMLODIPINE BESYLATE 10 MG PO TABS
10.0000 mg | ORAL_TABLET | Freq: Every day | ORAL | Status: DC
  Administered 2014-05-18: 10 mg via ORAL
  Filled 2014-05-18: qty 1

## 2014-05-18 MED ORDER — ASPIRIN 81 MG PO TBEC: 81.0000 mg | DELAYED_RELEASE_TABLET | Freq: Every day | ORAL | Status: DC

## 2014-05-18 MED ORDER — AMLODIPINE BESYLATE 10 MG PO TABS: 10.0000 mg | ORAL_TABLET | Freq: Every day | ORAL | Status: DC

## 2014-05-18 NOTE — Progress Notes (Signed)
Pt assessment unchanged from this am. D/c'd via wheelchair to private vehicle

## 2014-05-24 ENCOUNTER — Ambulatory Visit (INDEPENDENT_AMBULATORY_CARE_PROVIDER_SITE_OTHER): Payer: Medicaid Other | Admitting: Cardiology

## 2014-05-24 ENCOUNTER — Encounter: Payer: Self-pay | Admitting: Cardiology

## 2014-05-24 VITALS — BP 160/82 | HR 76 | Ht 66.0 in | Wt 183.0 lb

## 2014-05-24 DIAGNOSIS — R079 Chest pain, unspecified: Secondary | ICD-10-CM

## 2014-05-24 DIAGNOSIS — I10 Essential (primary) hypertension: Secondary | ICD-10-CM

## 2014-05-24 MED ORDER — HYDROCHLOROTHIAZIDE 25 MG PO TABS: 25.0000 mg | ORAL_TABLET | Freq: Every day | ORAL | Status: DC

## 2014-05-24 MED ORDER — POTASSIUM CHLORIDE ER 10 MEQ PO TBCR
10.0000 meq | EXTENDED_RELEASE_TABLET | Freq: Every day | ORAL | Status: DC
Start: 2014-05-24 — End: 2014-11-10

## 2014-05-24 NOTE — Patient Instructions (Signed)
Start HCTZ(hydrochlorothiazide) 25mg  daily.  Start KCL 10 mEq daily.  Your physician has requested that you regularly monitor and record your blood pressure readings at home. Please use the same machine at the same time of day to check your readings and record them. I will call you in about 3 weeks to get the readings.   I have given you a prescription for a BP cuff.  Your physician recommends that you return for lab work in: 2 weeks--BMET.  Your physician recommends that you schedule a follow-up appointment in: about 2 months with Dr Aundra Dubin.

## 2014-05-25 NOTE — Progress Notes (Signed)
Patient ID: Thomas Bowman, male   DOB: 1951/02/19, 63 y.o.   MRN: [id number] 63 yo with history of untreated HTN presents for evaluation of HTN and chest pain.  Patient has been under a lot of stress at work.  He has been in a financial dispute with his landlord.  In 7/15, he developed chest pressure at work while under stress.  He went to the ER.  SBP was well over 200.  He was admitted and started on amlodipine.  Cardiac enzymes were negative and Cardiolite was done.  This was a normal study.  CTA chest was negative for PE.  Since he has been home, he has had no more chest pain.  He has good exercise tolerance and has been walking 3.5 miles several days a week without dyspnea.  BP remains high in the office today.   ECG: NSR, normal (7/15)  Labs (7/15): LDL 112, HDl 45, K 4.2, creatinine 1.1, HCT 41.7  PMH: 1. HTN 2. H/o appendectomy 3. Chest pain: ETT-Cardiolite in 7/15 showed EF 64%, no ischemia or infarction.  4. 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 Sees ENT at [facility].    SH: Married, nonsmoker, no ETOH, Dealer.   FH: Cancer, no CAD.   ROS: All systems reviewed and negative except as per HPI.   Current Outpatient Prescriptions  Medication Sig Dispense Refill  . amLODipine (NORVASC) 10 MG tablet Take 1 tablet (10 mg total) by mouth daily.  30 tablet  1  . aspirin EC 81 MG EC tablet Take 1 tablet (81 mg total) by mouth daily.  30 tablet  1  . hydrochlorothiazide (HYDRODIURIL) 25 MG tablet Take 1 tablet (25 mg total) by mouth daily.  30 tablet  3  . potassium chloride (K-DUR) 10 MEQ tablet Take 1 tablet (10 mEq total) by mouth daily.  30 tablet  3   No current facility-administered medications for this visit.   BP 160/82  Pulse 76  Ht 5\' 6"  (1.676 m)  Wt 183 lb (83.008 kg)  BMI 29.55 kg/m2  SpO2 95% General: NAD Neck: No JVD, no thyromegaly or thyroid nodule.  Lungs: Clear to auscultation bilaterally with normal respiratory effort. CV: Nondisplaced PMI.  Heart regular  S1/S2, +S4, no murmur.  1+ ankle edema.  No carotid bruit.  Normal pedal pulses.  Abdomen: Soft, nontender, no hepatosplenomegaly, no distention.  Skin: Intact without lesions or rashes.  Neurologic: Alert and oriented x 3.  Psych: Normal affect. Extremities: No clubbing or cyanosis.  HEENT: Normal.   Assessment/Plan: 1. Chest pain: Appears to be related to stress and severely elevated blood pressure. He has not had chest pain since leaving the hospital.  Cardiolite showed no ischemia or infarction and CTA chest was negative for PE. 2. HTN: BP remains high.  He has an S4. He is certainly at higher risk for CVA, renal failure,and CAD in the future if BP is not controlled.  - Continue amlodipine - Add HCTZ 25 mg daily with KCl 10 daily - He will get a BP cuff.  - BP check in 2 wks.  BMET in 2 wks.   Followup in 2 months  Loralie Champagne 05/25/2014

## 2014-06-07 ENCOUNTER — Other Ambulatory Visit: Payer: Medicaid Other

## 2014-06-11 ENCOUNTER — Other Ambulatory Visit (INDEPENDENT_AMBULATORY_CARE_PROVIDER_SITE_OTHER): Payer: Medicaid Other

## 2014-06-11 DIAGNOSIS — I10 Essential (primary) hypertension: Secondary | ICD-10-CM

## 2014-06-12 LAB — BASIC METABOLIC PANEL
BUN: 16 mg/dL (ref 6–23)
CO2: 27 mEq/L (ref 19–32)
Calcium: 9.1 mg/dL (ref 8.4–10.5)
Chloride: 103 mEq/L (ref 96–112)
Creatinine, Ser: 1.3 mg/dL (ref 0.4–1.5)
GFR: 70.6 mL/min (ref >60.00)
Glucose, Bld: 146 mg/dL — ABNORMAL HIGH (ref 70–99)
POTASSIUM: 4 meq/L (ref 3.5–5.1)
SODIUM: 138 meq/L (ref 135–145)

## 2014-07-05 ENCOUNTER — Telehealth: Payer: Self-pay | Admitting: *Deleted

## 2014-07-05 DIAGNOSIS — I10 Essential (primary) hypertension: Secondary | ICD-10-CM

## 2014-07-05 MED ORDER — HYDROCHLOROTHIAZIDE 50 MG PO TABS: 50.0000 mg | ORAL_TABLET | Freq: Every day | ORAL | Status: DC

## 2014-07-05 NOTE — Telephone Encounter (Signed)
Copied from Dr Claris Gladden 05/24/14 office note:  HTN: BP remains high. He has an S4. He is certainly at higher risk for CVA, renal failure,and CAD in the future if BP is not controlled.  - Continue amlodipine  - Add HCTZ 25 mg daily with KCl 10 daily  - He will get a BP cuff.  - BP check in 2 wks. BMET in 2 wks.   07/05/14--spoke with patient and he did not have a record of his BP readings with him.  He states he will call me back with the readings.

## 2014-07-05 NOTE — Telephone Encounter (Signed)
BP still a bit high, would have him increase to 50 mg daily on HCTZ with BMET and BP check in 2 wks.

## 2014-07-05 NOTE — Telephone Encounter (Signed)
Pt advised,verbalized understanding. 

## 2014-07-05 NOTE — Telephone Encounter (Signed)
BP readings--least recent to most recent-- 147/97 144/96 122/96 115/83 147/92 139/86 156/99 143/91 128/83 140/83 145/89 140/92 163/92 128/86 -- Pt states he takes his medication at night. These BP readings were taken in the morning.  Pt states he tolerates his medication except it makes him feel tired.   I will forward to Dr Aundra Dubin for review.

## 2014-07-06 ENCOUNTER — Emergency Department (HOSPITAL_COMMUNITY)
Admission: EM | Admit: 2014-07-06 | Discharge: 2014-07-06 | Disposition: A | Discharge status: Home / Self Care | Payer: Medicaid Other | Source: Home / Self Care | Attending: Family Medicine | Admitting: Family Medicine

## 2014-07-06 ENCOUNTER — Encounter (HOSPITAL_COMMUNITY): Payer: Self-pay | Admitting: Emergency Medicine

## 2014-07-06 DIAGNOSIS — M25569 Pain in unspecified knee: Secondary | ICD-10-CM

## 2014-07-06 DIAGNOSIS — M25562 Pain in left knee: Secondary | ICD-10-CM

## 2014-07-06 NOTE — ED Provider Notes (Signed)
CSN: [id number]     Arrival date & time 07/06/14  1104 History   First MD Initiated Contact with Patient 07/06/14 1209     Chief Complaint  Patient presents with  . Knee Pain   (Consider location/radiation/quality/duration/timing/severity/associated sxs/prior Treatment) HPI Comments: 63 year old male presents complaining of left knee pain. He reports that 2 weeks ago he was walking up a hill when he felt a pop in his medial knee. Since then he has had constant pain that is worsened with any weightbearing activity. It is incompletely relieved by rest. He is not taking any medications for this, he states he does not believe in medications. The knee was swollen but it is not swollen anymore. Denies any numbness distal to this. No previous history of knee pain or any injuries   Past Medical History  Diagnosis Date  . Cancer 90s    bone  . Hypertension    Past Surgical History  Procedure Laterality Date  . Facial nerve surgery Right   . Appendectomy     Family History  Problem Relation Age of Onset  . Cancer Mother   . Cancer Father    History  Substance Use Topics  . Smoking status: Never Smoker   . Smokeless tobacco: Not on file  . Alcohol Use: No    Review of Systems  Musculoskeletal: Positive for arthralgias (Left knee pain).  All other systems reviewed and are negative.   Allergies  Review of patient's allergies indicates no known allergies.  Home Medications   Prior to Admission medications   Medication Sig Start Date End Date Taking? Authorizing Provider  amLODipine (NORVASC) 10 MG tablet Take 1 tablet (10 mg total) by mouth daily. 05/18/14   Orson Eva, MD  aspirin EC 81 MG EC tablet Take 1 tablet (81 mg total) by mouth daily. 05/18/14   Orson Eva, MD  hydrochlorothiazide (HYDRODIURIL) 50 MG tablet Take 1 tablet (50 mg total) by mouth daily. 07/05/14   Larey Dresser, MD  potassium chloride (K-DUR) 10 MEQ tablet Take 1 tablet (10 mEq total) by mouth daily. 05/24/14    Larey Dresser, MD   BP 179/81  Pulse 51  Temp(Src) 97.8 F (36.6 C) (Oral)  Resp 16  SpO2 98% Physical Exam  Nursing note and vitals reviewed. Constitutional: He is oriented to person, place, and time. He appears well-developed and well-nourished. No distress.  HENT:  Head: Normocephalic.  Cardiovascular:  Pulses:      Dorsalis pedis pulses are 2+ on the left side.  Pulmonary/Chest: Effort normal. No respiratory distress.  Musculoskeletal:       Left knee: He exhibits abnormal meniscus (medial joint line pain with McMurray's click test). He exhibits normal range of motion, no swelling, no effusion, no deformity, no erythema, normal alignment, no LCL laxity and no MCL laxity. Tenderness found. Medial joint line and MCL tenderness noted. No lateral joint line, no LCL and no patellar tendon tenderness noted.  Neurological: He is alert and oriented to person, place, and time. Coordination normal.  Skin: Skin is warm and dry. No rash noted. He is not diaphoretic.  Psychiatric: He has a normal mood and affect. Judgment normal.    ED Course  Procedures (including critical care time) Labs Review Labs Reviewed - No data to display  Imaging Review No results found.   MDM   1. Left knee pain    Probable medial meniscal tear. Referred to orthopedics. We'll give him a knee sleeve as well. He declines  any medications. Followup with orthopedic       Liam Graham, PA-C 07/06/14 1231

## 2014-07-06 NOTE — ED Notes (Signed)
Pt  Reports     Pain    In  The   l  Knee  For  Several  Weeks   He  denys  A  specefic  Injury  But  He reports  Pain on  Weight  Bearing

## 2014-07-06 NOTE — Discharge Instructions (Signed)

## 2014-07-09 NOTE — ED Provider Notes (Signed)
Medical screening examination/treatment/procedure(s) were performed by resident physician or non-physician practitioner and as supervising physician I was immediately available for consultation/collaboration.   Pauline Good MD.   Billy Fischer, MD 07/09/14 [phone]

## 2014-07-26 ENCOUNTER — Encounter: Payer: Self-pay | Admitting: *Deleted

## 2014-07-27 ENCOUNTER — Ambulatory Visit (INDEPENDENT_AMBULATORY_CARE_PROVIDER_SITE_OTHER): Payer: Medicaid Other | Admitting: Cardiology

## 2014-07-27 ENCOUNTER — Encounter: Payer: Self-pay | Admitting: Cardiology

## 2014-07-27 VITALS — BP 172/98 | HR 75 | Ht 66.0 in | Wt 179.0 lb

## 2014-07-27 DIAGNOSIS — G4719 Other hypersomnia: Secondary | ICD-10-CM

## 2014-07-27 DIAGNOSIS — I10 Essential (primary) hypertension: Secondary | ICD-10-CM

## 2014-07-27 DIAGNOSIS — G4733 Obstructive sleep apnea (adult) (pediatric): Secondary | ICD-10-CM | POA: Insufficient documentation

## 2014-07-27 MED ORDER — LISINOPRIL 10 MG PO TABS: 10.0000 mg | ORAL_TABLET | Freq: Every day | ORAL | Status: DC

## 2014-07-27 NOTE — Patient Instructions (Signed)
Start lisinopril 10mg  daily.   Take Magnesium Oxide 200mg  daily. You can get this without a prescription.  Your physician recommends that you have  lab work today--BMET. Your physician recommends that you return for lab work in: 2 weeks--BMET.  Your physician has requested that you regularly monitor and record your blood pressure readings at home. Please use the same machine at the same time of day to check your readings and record them. I will call you in about 3 weeks and get the readings.  Your physician has recommended that you have a sleep study. This test records several body functions during sleep, including: brain activity, eye movement, oxygen and carbon dioxide blood levels, heart rate and rhythm, breathing rate and rhythm, the flow of air through your mouth and nose, snoring, body muscle movements, and chest and belly movement.  Your physician recommends that you schedule a follow-up appointment in: 1 month with PA/NP.

## 2014-07-27 NOTE — Progress Notes (Signed)
Patient ID: Thomas Bowman, male   DOB: [birth year], 63 y.o.   MRN: [id number] 63 yo with history of untreated HTN presents for followup of HTN and chest pain.  In 7/15, he developed chest pressure at work while under stress.  He went to the ER.  SBP was well over 200.  He was admitted and started on amlodipine.  Cardiac enzymes were negative and Cardiolite was done.  This was a normal study.  CTA chest was negative for PE.  At last appointment, I adjusted his BP meds.  He has had no more chest pain.  He has good exercise tolerance and has been walking 3.5 miles several days a week without dyspnea.  BP remains high in the office today and has been high at home.  He follows a relatively high sodium diet.  He falls asleep easily during the day and snores at night.   Labs (7/15): LDL 112, HDl 45, K 4.2, creatinine 1.1, HCT 41.7 Labs (8/15): K 4, creatinine 1.3  PMH: 1. HTN 2. H/o appendectomy 3. Chest pain: ETT-Cardiolite in 7/15 showed EF 64%, no ischemia or infarction.  4. 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 He has had jaw surgery.  Sees ENT at [facility].    SH: Married, nonsmoker, no ETOH, Dealer.   FH: Cancer, no CAD.   ROS: All systems reviewed and negative except as per HPI.   Current Outpatient Prescriptions  Medication Sig Dispense Refill  . amLODipine (NORVASC) 10 MG tablet Take 1 tablet (10 mg total) by mouth daily.  30 tablet  1  . aspirin EC 81 MG EC tablet Take 1 tablet (81 mg total) by mouth daily.  30 tablet  1  . hydrochlorothiazide (HYDRODIURIL) 50 MG tablet Take 1 tablet (50 mg total) by mouth daily.  30 tablet  2  . potassium chloride (K-DUR) 10 MEQ tablet Take 1 tablet (10 mEq total) by mouth daily.  30 tablet  3  . lisinopril (PRINIVIL,ZESTRIL) 10 MG tablet Take 1 tablet (10 mg total) by mouth daily.  30 tablet  3  . Magnesium Oxide 200 MG TABS 1 tablet daily       No current facility-administered medications for this visit.   BP 172/98  Pulse 75  Ht 5\' 6"  (1.676 m)   Wt 179 lb (81.194 kg)  BMI 28.91 kg/m2  SpO2 98% General: NAD Neck: No JVD, no thyromegaly or thyroid nodule.  Lungs: Clear to auscultation bilaterally with normal respiratory effort. CV: Nondisplaced PMI.  Heart regular S1/S2, +S4, no murmur.  No edema.  No carotid bruit.  Normal pedal pulses.  Abdomen: Soft, nontender, no hepatosplenomegaly, no distention.  Skin: Intact without lesions or rashes.  Neurologic: Alert and oriented x 3.  Psych: Normal affect. Extremities: No clubbing or cyanosis.   Assessment/Plan: 1. Chest pain: Appeared to have been related to stress and severely elevated blood pressure. He has not had chest pain since leaving the hospital.  Cardiolite showed no ischemia or infarction and CTA chest was negative for PE. 2. HTN: BP remains high.  He has an S4. He is certainly at higher risk for CVA, renal failure,and CAD in the future if BP is not controlled.  - Continue amlodipine and HCTZ.  - I will add lisinopril 10 mg daily.  BMET in 2 wks.   - he will check his BP daily and we will call in 2 wks for readings.  - Cut back to < 2 g daily sodium.  3. Possible OSA: Patient has significant daytime sleepiness.  He has been operated on for head and neck cancer which may make him more susceptible to airways obstruction.  I will arrange a sleep study.  If he has OSA, this should be treated to help decrease BP.   Followup in 1 month   Loralie Champagne 07/27/2014

## 2014-07-28 LAB — BASIC METABOLIC PANEL
BUN: 18 mg/dL (ref 6–23)
CALCIUM: 9.3 mg/dL (ref 8.4–10.5)
CO2: 25 mEq/L (ref 19–32)
Chloride: 99 mEq/L (ref 96–112)
Creatinine, Ser: 1.1 mg/dL (ref 0.4–1.5)
GFR: 84.44 mL/min (ref >60.00)
GLUCOSE: 170 mg/dL — AB (ref 70–99)
POTASSIUM: 4.1 meq/L (ref 3.5–5.1)
Sodium: 134 mEq/L — ABNORMAL LOW (ref 135–145)

## 2014-08-10 ENCOUNTER — Other Ambulatory Visit (INDEPENDENT_AMBULATORY_CARE_PROVIDER_SITE_OTHER): Payer: Medicaid Other | Admitting: *Deleted

## 2014-08-10 DIAGNOSIS — I10 Essential (primary) hypertension: Secondary | ICD-10-CM

## 2014-08-10 LAB — BASIC METABOLIC PANEL
BUN: 25 mg/dL — AB (ref 6–23)
CALCIUM: 9.4 mg/dL (ref 8.4–10.5)
CHLORIDE: 102 meq/L (ref 96–112)
CO2: 29 mEq/L (ref 19–32)
CREATININE: 1.4 mg/dL (ref 0.4–1.5)
GFR: 64.34 mL/min (ref >60.00)
Glucose, Bld: 104 mg/dL — ABNORMAL HIGH (ref 70–99)
Potassium: 4 mEq/L (ref 3.5–5.1)
Sodium: 138 mEq/L (ref 135–145)

## 2014-08-13 ENCOUNTER — Other Ambulatory Visit: Payer: Self-pay

## 2014-08-13 DIAGNOSIS — I10 Essential (primary) hypertension: Secondary | ICD-10-CM

## 2014-08-19 ENCOUNTER — Telehealth: Payer: Self-pay | Admitting: *Deleted

## 2014-08-19 NOTE — Telephone Encounter (Signed)
Copied from Dr Claris Gladden 07/27/14 office note:   HTN: BP remains high. He has an S4. He is certainly at higher risk for CVA, renal failure,and CAD in the future if BP is not controlled.  - Continue amlodipine and HCTZ.  - I will add lisinopril 10 mg daily. BMET in 2 wks.  - he will check his BP daily and we will call in 2 wks for readings.   08/19/14 Pt states his BP has been 128/86 and 130/89 when he checked it.  He had it checked at [facility] last week and was told it was OK but he didn't know the reading. Pt has appt 08/23/14 with NP. Pt advised to check his BP daily and bring the readings to his appt with NP 08/23/14

## 2014-08-19 NOTE — Telephone Encounter (Signed)
That is good

## 2014-08-23 ENCOUNTER — Encounter: Payer: Self-pay | Admitting: Nurse Practitioner

## 2014-08-23 ENCOUNTER — Ambulatory Visit (INDEPENDENT_AMBULATORY_CARE_PROVIDER_SITE_OTHER): Payer: Medicaid Other | Admitting: Nurse Practitioner

## 2014-08-23 VITALS — BP 164/100 | HR 62 | Ht 66.0 in | Wt 182.0 lb

## 2014-08-23 DIAGNOSIS — I10 Essential (primary) hypertension: Secondary | ICD-10-CM

## 2014-08-23 DIAGNOSIS — G4733 Obstructive sleep apnea (adult) (pediatric): Secondary | ICD-10-CM

## 2014-08-23 LAB — BASIC METABOLIC PANEL
BUN: 14 mg/dL (ref 6–23)
CO2: 23 mEq/L (ref 19–32)
Calcium: 8.7 mg/dL (ref 8.4–10.5)
Chloride: 102 mEq/L (ref 96–112)
Creatinine, Ser: 1.2 mg/dL (ref 0.4–1.5)
GFR: 81.1 mL/min (ref >60.00)
Glucose, Bld: 113 mg/dL — ABNORMAL HIGH (ref 70–99)
Potassium: 4.3 mEq/L (ref 3.5–5.1)
Sodium: 136 mEq/L (ref 135–145)

## 2014-08-23 MED ORDER — HYDRALAZINE HCL 25 MG PO TABS
25.0000 mg | ORAL_TABLET | Freq: Three times a day (TID) | ORAL | Status: DC
Start: 2014-08-23 — End: 2014-11-10

## 2014-08-23 NOTE — Patient Instructions (Addendum)
We will be checking the following labs today BMET  Stay on your current medicines but I am adding Hydralazine 25 mg to take 3 times a day -  This has been sent to your drug store  Take your HCTZ in the morning. Take the amlodipine and lisinopril at night  See me in one month  Try to minimize your salt.  Call the [place] office at [phone] if you have any questions, problems or concerns.

## 2014-08-23 NOTE — Progress Notes (Signed)
Thomas Bowman Date of Birth: 09/29/1951 Medical Record #[id number]  History of Present Illness: Thomas Bowman is seen back today for a one month check. Seen for Dr. Aundra Dubin. He has a history of untreated HTN. In 7/15, he developed chest pressure at work while under stress. He went to the ER. SBP was well over 200. He was admitted and started on amlodipine. Cardiac enzymes were negative and Cardiolite was done. This was a normal study. CTA chest was negative for PE.   Seen last month - medicines adjusted again for BP -  Lisinopril was started. Using lots of sodium. Referred for sleep study as well.   Comes back today. Here alone. He is doing ok. Not clear as to whether he is checking his BP at home - he is thinking about getting a new BP cuff. No chest pain. Not short of breath. Asking about stress and the relation to elevated BP. Probably getting too much salt still. He takes all of his medicines at night. He says his medicines make him tired. He is for a sleep study next month.    Current Outpatient Prescriptions  Medication Sig Dispense Refill  . amLODipine (NORVASC) 10 MG tablet Take 1 tablet (10 mg total) by mouth daily. 30 tablet 1  . aspirin EC 81 MG EC tablet Take 1 tablet (81 mg total) by mouth daily. 30 tablet 1  . hydrochlorothiazide (HYDRODIURIL) 50 MG tablet Take 1 tablet (50 mg total) by mouth daily. 30 tablet 2  . lisinopril (PRINIVIL,ZESTRIL) 10 MG tablet Take 1 tablet (10 mg total) by mouth daily. 30 tablet 3  . Magnesium Oxide 200 MG TABS 1 tablet daily    . potassium chloride (K-DUR) 10 MEQ tablet Take 1 tablet (10 mEq total) by mouth daily. 30 tablet 3   No current facility-administered medications for this visit.    No Known Allergies  PMH: 1. HTN 2. H/o appendectomy 3. Chest pain: ETT-Cardiolite in 7/15 showed EF 64%, no ischemia or infarction.  4. 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 He has had jaw surgery. Sees ENT at [facility].    Past Medical History   Diagnosis Date  . Bone cancer 90s  . Hypertension     Past Surgical History  Procedure Laterality Date  . Facial nerve surgery Right   . Appendectomy      History  Smoking status  . Never Smoker   Smokeless tobacco  . Not on file    History  Alcohol Use No    Family History  Problem Relation Age of Onset  . Cancer Mother   . Cancer Father     Review of Systems: The review of systems is per the HPI.  All other systems were reviewed and are negative.  Physical Exam: BP 164/100 mmHg  Pulse 62  Ht 5\' 6"  (1.676 m)  Wt 182 lb (82.555 kg)  BMI 29.39 kg/m2 Patient is very pleasant and in no acute distress. Skin is warm and dry. Color is normal.  HEENT is unremarkable but has had past jaw surgery/some facial deformity noted. Normocephalic/atraumatic. PERRL. Sclera are nonicteric. Neck is supple. No masses. No JVD. Lungs are clear. Cardiac exam shows a regular rate and rhythm. Abdomen is soft. Extremities are without edema. Gait and ROM are intact. No gross neurologic deficits noted.  Wt Readings from Last 3 Encounters:  08/23/14 182 lb (82.555 kg)  07/27/14 179 lb (81.194 kg)  05/24/14 183 lb (83.008 kg)    LABORATORY  DATA/PROCEDURES: BMET pending  Lab Results  Component Value Date   WBC 4.4 05/16/2014   HGB 14.5 05/16/2014   HCT 41.7 05/16/2014   PLT 180 05/16/2014   GLUCOSE 104* 08/10/2014   CHOL 178 05/17/2014   TRIG 104 05/17/2014   HDL 45 05/17/2014   LDLCALC 112* 05/17/2014   ALT 14 04/24/2010   AST 28 04/24/2010   NA 138 08/10/2014   K 4.0 08/10/2014   CL 102 08/10/2014   CREATININE 1.4 08/10/2014   BUN 25* 08/10/2014   CO2 29 08/10/2014   HGBA1C 6.2* 05/17/2014    BNP (last 3 results) No results for input(s): PROBNP in the last 8760 hours.  EXAM: MYOCARDIAL IMAGING WITH SPECT (REST AND EXERCISE)  GATED LEFT VENTRICULAR WALL MOTION STUDY  LEFT VENTRICULAR EJECTION FRACTION  TECHNIQUE: Standard myocardial SPECT imaging was performed  after resting intravenous injection of 10 mCi Tc-12m sestamibi. Subsequently, exercise tolerance test was performed by the patient under the supervision of the Cardiology staff. At peak-stress, 30 mCi Tc-69m sestamibi was injected intravenously and standard myocardial SPECT imaging was performed. Quantitative gated imaging was also performed to evaluate left ventricular wall motion, and estimate left ventricular ejection fraction.  COMPARISON: One-view chest x-ray from the same day.  FINDINGS: Myocardial uptake is within normal limits. There are no fixed reversible defects.  Gated imaging demonstrates normal contractility and wall thickening.  The estimated diastolic volume on gated imaging is 162 mL. The estimated systolic volume is 65 mL. The calculated ejection fraction is 64%.  IMPRESSION: 1. No evidence for inducible ischemia. 2. Normal contractility. 3. Normal ejection fraction of 64%.   Electronically Signed  By: Lawrence Santiago M.D.  On: 05/17/2014 16:14 Assessment / Plan: 1. HTN - BP recheck by me is 170/100 in both arms. Adding Hydralazine 25 mg TID. Changing HCTZ to the AM. Norvasc and Lisinopril to PM. See back in a month. Check BMET today - may be able to stop the potassium. See back in a month.  2. Prior chest pain - negative Myoview back in July - no symptoms reported  3. Probable OSA - for sleep study next month.   Patient is agreeable to this plan and will call if any problems develop in the interim.   Burtis Junes, RN, [place] [address] [place], [place]  [id number] [phone]

## 2014-09-10 ENCOUNTER — Ambulatory Visit (HOSPITAL_BASED_OUTPATIENT_CLINIC_OR_DEPARTMENT_OTHER): Payer: Medicaid Other | Attending: Cardiology

## 2014-09-10 VITALS — Ht 66.5 in | Wt 180.0 lb

## 2014-09-10 DIAGNOSIS — G4719 Other hypersomnia: Secondary | ICD-10-CM

## 2014-09-10 DIAGNOSIS — Z6828 Body mass index (BMI) 28.0-28.9, adult: Secondary | ICD-10-CM | POA: Insufficient documentation

## 2014-09-10 DIAGNOSIS — G4733 Obstructive sleep apnea (adult) (pediatric): Secondary | ICD-10-CM

## 2014-09-10 DIAGNOSIS — I10 Essential (primary) hypertension: Secondary | ICD-10-CM

## 2014-09-10 DIAGNOSIS — R0683 Snoring: Secondary | ICD-10-CM | POA: Diagnosis not present

## 2014-09-10 DIAGNOSIS — R51 Headache: Secondary | ICD-10-CM | POA: Insufficient documentation

## 2014-09-10 DIAGNOSIS — R5383 Other fatigue: Secondary | ICD-10-CM | POA: Diagnosis present

## 2014-09-13 ENCOUNTER — Telehealth: Payer: Self-pay | Admitting: Cardiology

## 2014-09-13 ENCOUNTER — Telehealth: Payer: Self-pay | Admitting: *Deleted

## 2014-09-13 DIAGNOSIS — G4719 Other hypersomnia: Secondary | ICD-10-CM | POA: Insufficient documentation

## 2014-09-13 NOTE — Telephone Encounter (Signed)
>','<< Less Detail',event)" href="javascript:;">More Detail >>   Larey Dresser, MD   Sent: Mon September 13, 2014 9:11 AM   To: Katrine Coho, RN; P Cv Div Ch St Triage      Message    No sleep apnea   ----- Message -----    From: Sueanne Margarita, MD    Sent: 09/13/2014  9:01 AM     To: Larey Dresser, MD, No Pcp Per Patient             Attached Notes    Sleep Study by Sueanne Margarita, MD at 09/13/2014 7:35 AM    Author: Sueanne Margarita, MD Service: (none) Author Type: Physician   Filed: 09/13/2014 9:01 AM Note Time: 09/13/2014 7:35 AM Note Type: Sleep Study   Status: Signed Editor: Sueanne Margarita, MD (Physician)   Expand All Collapse All     NAME: Thomas Bowman DATE OF BIRTH: Aug 25, 1951 MEDICAL RECORD [id number]  LOCATION: [facility]  PHYSICIAN: TURNER,TRACI R  DATE OF STUDY: 09/10/2014  SLEEP STUDY TYPE: Nocturnal Polysomnogram   REFERRING PHYSICIAN: Larey Dresser, MD  INDICATION FOR STUDY: Snoring, daytime fatigue, morning headaches and HTN  EPWORTH SLEEPINESS SCORE: 15 HEIGHT: 5' 6.5" (168.9 cm)  WEIGHT: 180 lb (81.647 kg) Body mass index is 28.62 kg/(m^2).  NECK SIZE: 15.5 in. BMI: 29   MEDICATIONS: Reviewed in the chart  SLEEP ARCHITECTURE: The patient slept for a total of 347 minutes with no significant slow wave sleep. The amount of time spent in REM sleep was reduced at 46 minutes. The onset to sleep latency was normal at 8 minutes. The onset to REM sleep was 62 minutes. The sleep efficiency was normal at 92%.   RESPIRATORY DATA: The patient had no apneas during the study. There were 5 hypopneas noted. Most events were in NREM sleep with equal frequency between supine and nonsupine position. The AHI was normal at 0.9 events per hour and the RDI was normal at 1 event per hour. Mild snoring was noted.  OXYGEN DATA: The nadir  O2 sat during REM sleep was 91% and during NREM sleep was 89%. There was no O2 desats <90% during the study.   CARDIAC DATA: The patient maintained NSR with PAC's during the study.  MOVEMENT/PARASOMNIA: There were no periodic limb movements or REM sleep disorders noted.   IMPRESSION/ RECOMMENDATION:  1. No significant sleep disordered breathing noted. 2. No evidence of periodic limb movement disorder or REM sleep disorders. 3. No significant O2 desaturations during sleep.  4. Good Sleep Efficiency  5. The patient did have mild snoring so referral to ENT for evaluation of surgical causes of snoring could be considered.  6. The patient should be counseled in good sleep hygiene.   [place], American Board of Sleep Medicine  ELECTRONICALLY SIGNED ON: 09/13/2014, 7:36 AM [place] PH: [phone] FX: [phone] [place] [facility]ICINE                  Message From: Larey Dresser, MD 09/13/2014 9:11 AM    No sleep apnea ----- Message -----  From: Sueanne Margarita, MD  Sent: 09/13/2014  9:01 AM   To: Larey Dresser, MD, No Pcp Per Patient         >','<< Less Detail',event)" href="javascript:;">More Detail >>   Larey Dresser, MD   Sent: Mon September 13, 2014 9:11 AM  To: Katrine Coho, RN; P Cv Div Ch St Triage      Message    No sleep apnea   ----- Message -----    From: Sueanne Margarita, MD    Sent: 09/13/2014  9:01 AM     To: Larey Dresser, MD, No Pcp Per Patient             Attached Notes    Sleep Study by Sueanne Margarita, MD at 09/13/2014 7:35 AM    Author: Sueanne Margarita, MD Service: (none) Author Type: Physician   Filed: 09/13/2014 9:01 AM Note Time: 09/13/2014 7:35 AM Note Type: Sleep Study   Status: Signed Editor: Sueanne Margarita, MD (Physician)   Expand All Collapse All       NAME: Thomas Bowman DATE OF BIRTH: 02-Oct-1951 MEDICAL RECORD [id number]  LOCATION: [facility]  PHYSICIAN: TURNER,TRACI R  DATE OF STUDY: 09/10/2014  SLEEP STUDY TYPE: Nocturnal Polysomnogram   REFERRING PHYSICIAN: Larey Dresser, MD  INDICATION FOR STUDY: Snoring, daytime fatigue, morning headaches and HTN  EPWORTH SLEEPINESS SCORE: 15 HEIGHT: 5' 6.5" (168.9 cm)  WEIGHT: 180 lb (81.647 kg) Body mass index is 28.62 kg/(m^2).  NECK SIZE: 15.5 in. BMI: 29   MEDICATIONS: Reviewed in the chart  SLEEP ARCHITECTURE: The patient slept for a total of 347 minutes with no significant slow wave sleep. The amount of time spent in REM sleep was reduced at 46 minutes. The onset to sleep latency was normal at 8 minutes. The onset to REM sleep was 62 minutes. The sleep efficiency was normal at 92%.   RESPIRATORY DATA: The patient had no apneas during the study. There were 5 hypopneas noted. Most events were in NREM sleep with equal frequency between supine and nonsupine position. The AHI was normal at 0.9 events per hour and the RDI was normal at 1 event per hour. Mild snoring was noted.  OXYGEN DATA: The nadir O2 sat during REM sleep was 91% and during NREM sleep was 89%. There was no O2 desats <90% during the study.   CARDIAC DATA: The patient maintained NSR with PAC's during the study.  MOVEMENT/PARASOMNIA: There were no periodic limb movements or REM sleep disorders noted.   IMPRESSION/ RECOMMENDATION:  1. No significant sleep disordered breathing noted. 2. No evidence of periodic limb movement disorder or REM sleep disorders. 3. No significant O2 desaturations during sleep.  4. Good Sleep Efficiency  5. The patient did have mild snoring so referral to ENT for evaluation of surgical causes of snoring could be considered.  6. The patient should be counseled in good  sleep hygiene.   [place], American Board of Sleep Medicine  ELECTRONICALLY SIGNED ON: 09/13/2014, 7:36 AM [place] PH: [phone] FX: [phone] [place] [facility]INE                  Message From: Larey Dresser, MD 09/13/2014 9:11 AM    No sleep apnea ----- Message -----  From: Sueanne Margarita, MD  Sent: 09/13/2014  9:01 AM   To: Larey Dresser, MD, No Pcp Per Patient        09/13/14--pt notified

## 2014-09-13 NOTE — Sleep Study (Signed)
   NAME: Thomas Bowman DATE OF BIRTH:  December 31, 1950 MEDICAL RECORD NUMBER [id number]  LOCATION: [facility]  PHYSICIAN: Ailed Defibaugh R  DATE OF STUDY: 09/10/2014  SLEEP STUDY TYPE: Nocturnal Polysomnogram               REFERRING PHYSICIAN: Larey Dresser, MD  INDICATION FOR STUDY: Snoring, daytime fatigue, morning headaches and HTN  EPWORTH SLEEPINESS SCORE: 15 HEIGHT: 5' 6.5" (168.9 cm)  WEIGHT: 180 lb (81.647 kg)    Body mass index is 28.62 kg/(m^2).  NECK SIZE: 15.5 in. BMI:  29   MEDICATIONS: Reviewed in the chart  SLEEP ARCHITECTURE: The patient slept for a total of 347 minutes with no significant slow wave sleep.  The amount of time spent in REM sleep was reduced at 46 minutes.  The onset to sleep latency was normal at 8 minutes.  The onset to REM sleep was 62 minutes.  The sleep efficiency was normal at 92%.    RESPIRATORY DATA: The patient had no apneas during the study.  There were 5 hypopneas noted.  Most events were in NREM sleep with equal frequency between supine and nonsupine position.  The AHI was normal at 0.9 events per hour and the RDI was normal at 1 event per hour.  Mild snoring was noted.  OXYGEN DATA: The nadir O2 sat during REM sleep was 91% and during NREM sleep was 89%. There was no O2 desats <90% during the study.    CARDIAC DATA: The patient maintained NSR with PAC's during the study.  MOVEMENT/PARASOMNIA: There were no periodic limb movements or REM sleep disorders noted.    IMPRESSION/ RECOMMENDATION:   1.  No significant sleep disordered breathing noted. 2.  No evidence of periodic limb movement disorder or REM sleep disorders. 3.  No significant O2 desaturations during sleep.  4.  Good Sleep Efficiency  5.  The patient did have mild snoring so referral to ENT for evaluation of surgical causes of snoring could be considered.   6.  The patient should be counseled in good sleep hygiene.   [place], American Board  of Sleep Medicine  ELECTRONICALLY SIGNED ON:  09/13/2014, 7:36 AM [place] PH: [phone]   FX: [phone] [place]

## 2014-09-13 NOTE — Telephone Encounter (Signed)
Please let patient know that he does not have any significant OSA and followup with Dr. Aundra Dubin

## 2014-09-13 NOTE — Telephone Encounter (Signed)
I attempted to call the patient. No answer/ no voice mail set up.

## 2014-09-14 ENCOUNTER — Other Ambulatory Visit (INDEPENDENT_AMBULATORY_CARE_PROVIDER_SITE_OTHER): Payer: Medicaid Other | Admitting: *Deleted

## 2014-09-14 DIAGNOSIS — G4719 Other hypersomnia: Secondary | ICD-10-CM

## 2014-09-14 DIAGNOSIS — I10 Essential (primary) hypertension: Secondary | ICD-10-CM

## 2014-09-14 LAB — BASIC METABOLIC PANEL
BUN: 27 mg/dL — ABNORMAL HIGH (ref 6–23)
CHLORIDE: 99 meq/L (ref 96–112)
CO2: 28 meq/L (ref 19–32)
CREATININE: 1.2 mg/dL (ref 0.4–1.5)
Calcium: 8.8 mg/dL (ref 8.4–10.5)
GFR: 81.08 mL/min (ref >60.00)
Glucose, Bld: 111 mg/dL — ABNORMAL HIGH (ref 70–99)
POTASSIUM: 4.1 meq/L (ref 3.5–5.1)
SODIUM: 134 meq/L — AB (ref 135–145)

## 2014-09-14 NOTE — Telephone Encounter (Signed)
Patient informed. Voices good understanding. Has appointment with Truitt Merle on 09/21/2014.

## 2014-09-21 ENCOUNTER — Ambulatory Visit (INDEPENDENT_AMBULATORY_CARE_PROVIDER_SITE_OTHER): Payer: Medicaid Other | Admitting: Nurse Practitioner

## 2014-09-21 ENCOUNTER — Encounter: Payer: Self-pay | Admitting: Nurse Practitioner

## 2014-09-21 VITALS — BP 170/90 | HR 58 | Ht 66.5 in | Wt 181.8 lb

## 2014-09-21 DIAGNOSIS — R252 Cramp and spasm: Secondary | ICD-10-CM

## 2014-09-21 DIAGNOSIS — I10 Essential (primary) hypertension: Secondary | ICD-10-CM

## 2014-09-21 MED ORDER — LISINOPRIL 20 MG PO TABS
20.0000 mg | ORAL_TABLET | Freq: Every day | ORAL | Status: DC
Start: 2014-09-21 — End: 2014-11-10

## 2014-09-21 NOTE — Progress Notes (Signed)
[name] Date of Birth: 1951/08/20 Medical Record #[id number]  History of Present Illness: Mr. Gombos is seen back today for a one month check. Seen for Dr. Aundra Dubin. He has a history of untreated HTN. In 7/15, he developed chest pressure at work while under stress. He went to the ER. SBP was well over 200. He was admitted and started on amlodipine. Cardiac enzymes were negative and Cardiolite was done. This was a normal study. CTA chest was negative for PE.   Seen back in October - medicines adjusted again for BP - Lisinopril was started. Using lots of sodium. Referred for sleep study as well. I saw him a month ago - added Hydralazine and changed the timing of some of his medicines (HCTZ to AM and Lisinopril to PM).   Comes back today. Here alone. He is doing ok. Some cramps in his feet and hands. BP little better. Still taking his HCTZ at night. No chest pain. Still probably getting too much salt - had ham this am and at Thanksgiving.   Current Outpatient Prescriptions  Medication Sig Dispense Refill  . amLODipine (NORVASC) 10 MG tablet Take 1 tablet (10 mg total) by mouth daily. 30 tablet 1  . aspirin EC 81 MG EC tablet Take 1 tablet (81 mg total) by mouth daily. (Patient taking differently: Take 162 mg by mouth daily. ) 30 tablet 1  . hydrALAZINE (APRESOLINE) 25 MG tablet Take 1 tablet (25 mg total) by mouth 3 (three) times daily. 90 tablet 3  . hydrochlorothiazide (HYDRODIURIL) 50 MG tablet Take 1 tablet (50 mg total) by mouth daily. 30 tablet 2  . lisinopril (PRINIVIL,ZESTRIL) 10 MG tablet Take 1 tablet (10 mg total) by mouth daily. 30 tablet 3  . Magnesium Oxide 200 MG TABS 1 tablet daily    . potassium chloride (K-DUR) 10 MEQ tablet Take 1 tablet (10 mEq total) by mouth daily. 30 tablet 3   No current facility-administered medications for this visit.    No Known Allergies  Past Medical History  Diagnosis Date  . Bone cancer 90s  . Hypertension     Past Surgical  History  Procedure Laterality Date  . Facial nerve surgery Right   . Appendectomy      History  Smoking status  . Never Smoker   Smokeless tobacco  . Not on file    History  Alcohol Use No    Family History  Problem Relation Age of Onset  . Cancer Mother   . Cancer Father     Review of Systems: The review of systems is per the HPI.  All other systems were reviewed and are negative.  Physical Exam: BP 170/90 mmHg  Pulse 58  Ht 5' 6.5" (1.689 m)  Wt 181 lb 12.8 oz (82.464 kg)  BMI 28.91 kg/m2  SpO2 98%  Recheck by me his BP is 150/80. His machine is 144/90. Patient is very pleasant and in no acute distress. Skin is warm and dry. Color is normal.  HEENT is unremarkable. Normocephalic/atraumatic. PERRL. Sclera are nonicteric. Neck is supple. No masses. No JVD. Lungs are clear. Cardiac exam shows a regular rate and rhythm. Abdomen is soft. Extremities are without edema. Gait and ROM are intact. No gross neurologic deficits noted.  Wt Readings from Last 3 Encounters:  09/21/14 181 lb 12.8 oz (82.464 kg)  09/10/14 180 lb (81.647 kg)  08/23/14 182 lb (82.555 kg)    LABORATORY DATA/PROCEDURES: BMET and Mg pending  Lab Results  Component Value Date   WBC 4.4 05/16/2014   HGB 14.5 05/16/2014   HCT 41.7 05/16/2014   PLT 180 05/16/2014   GLUCOSE 111* 09/14/2014   CHOL 178 05/17/2014   TRIG 104 05/17/2014   HDL 45 05/17/2014   LDLCALC 112* 05/17/2014   ALT 14 04/24/2010   AST 28 04/24/2010   NA 134* 09/14/2014   K 4.1 09/14/2014   CL 99 09/14/2014   CREATININE 1.2 09/14/2014   BUN 27* 09/14/2014   CO2 28 09/14/2014   HGBA1C 6.2* 05/17/2014    BNP (last 3 results) No results for input(s): PROBNP in the last 8760 hours.     MYOCARDIAL IMAGING WITH SPECT (REST AND EXERCISE)  GATED LEFT VENTRICULAR WALL MOTION STUDY  LEFT VENTRICULAR EJECTION FRACTION  TECHNIQUE: Standard myocardial SPECT imaging was performed after resting intravenous injection of 10  mCi Tc-31m sestamibi. Subsequently, exercise tolerance test was performed by the patient under the supervision of the Cardiology staff. At peak-stress, 30 mCi Tc-41m sestamibi was injected intravenously and standard myocardial SPECT imaging was performed. Quantitative gated imaging was also performed to evaluate left ventricular wall motion, and estimate left ventricular ejection fraction.  COMPARISON: One-view chest x-ray from the same day.  FINDINGS: Myocardial uptake is within normal limits. There are no fixed reversible defects.  Gated imaging demonstrates normal contractility and wall thickening.  The estimated diastolic volume on gated imaging is 162 mL. The estimated systolic volume is 65 mL. The calculated ejection fraction is 64%.  IMPRESSION: 1. No evidence for inducible ischemia. 2. Normal contractility. 3. Normal ejection fraction of 64%.   Electronically Signed  By: Lawrence Santiago M.D.  On: 05/17/2014 16:14   IMPRESSION/ RECOMMENDATION:  1. No significant sleep disordered breathing noted. 2. No evidence of periodic limb movement disorder or REM sleep disorders. 3. No significant O2 desaturations during sleep.  4. Good Sleep Efficiency  5. The patient did have mild snoring so referral to ENT for evaluation of surgical causes of snoring could be considered.  6. The patient should be counseled in good sleep hygiene.   [place], American Board of Sleep Medicine   Assessment / Plan:  1. HTN - BP improving but not at goal - will increase his Lisinopril to 20 mg a day. See back in a month. Check BMET and Mg level today.   2. Prior chest pain - negative Myoview back in July - no symptoms reported  3. Negative sleep study  4. Cramps - check K & Mg today.   See in a month. Encouraged to restrict his salt.   Patient is agreeable to this plan and will call if any problems develop in the interim.   Burtis Junes, RN,  [facility] [address] [place], [place]  [address] [phone]

## 2014-09-21 NOTE — Patient Instructions (Addendum)
We will be checking the following labs today BMET and MG  Stay on your current medicines but increase the Lisinopril to 20 mg a day - you can take 2 of your 10 mg tabs to make this dose and the 20 mg tab is at your drug store  Try to cut back on your salt.   See me in one month  Call the [place] office at [phone] if you have any questions, problems or concerns.

## 2014-09-22 LAB — BASIC METABOLIC PANEL
BUN: 27 mg/dL — ABNORMAL HIGH (ref 6–23)
CO2: 29 mEq/L (ref 19–32)
Calcium: 8.7 mg/dL (ref 8.4–10.5)
Chloride: 99 mEq/L (ref 96–112)
Creatinine, Ser: 1.2 mg/dL (ref 0.4–1.5)
GFR: 79.5 mL/min (ref >60.00)
Glucose, Bld: 113 mg/dL — ABNORMAL HIGH (ref 70–99)
Potassium: 4.1 mEq/L (ref 3.5–5.1)
Sodium: 132 mEq/L — ABNORMAL LOW (ref 135–145)

## 2014-09-22 LAB — MAGNESIUM: Magnesium: 2 mg/dL (ref 1.5–2.5)

## 2014-09-24 ENCOUNTER — Telehealth: Payer: Self-pay | Admitting: *Deleted

## 2014-09-24 DIAGNOSIS — I10 Essential (primary) hypertension: Secondary | ICD-10-CM

## 2014-09-24 NOTE — Telephone Encounter (Signed)
pt notified about lab results with verbal understanding. Pt states he still is having cramping in hands and feet. I advised pt K+ normal however; I will let Tera Helper. NP know and if she has any new recommendations we will cb. Pt said ok and thank you.

## 2014-10-10 ENCOUNTER — Encounter (HOSPITAL_BASED_OUTPATIENT_CLINIC_OR_DEPARTMENT_OTHER): Payer: Medicaid Other

## 2014-11-02 ENCOUNTER — Ambulatory Visit: Payer: Medicaid Other | Admitting: Nurse Practitioner

## 2014-11-02 ENCOUNTER — Other Ambulatory Visit: Payer: Medicaid Other

## 2014-11-10 ENCOUNTER — Encounter: Payer: Self-pay | Admitting: Nurse Practitioner

## 2014-11-10 ENCOUNTER — Telehealth: Payer: Self-pay | Admitting: *Deleted

## 2014-11-10 ENCOUNTER — Ambulatory Visit (INDEPENDENT_AMBULATORY_CARE_PROVIDER_SITE_OTHER): Payer: Medicaid Other | Admitting: Nurse Practitioner

## 2014-11-10 VITALS — BP 190/100 | HR 73 | Ht 66.5 in | Wt 186.1 lb

## 2014-11-10 DIAGNOSIS — G4719 Other hypersomnia: Secondary | ICD-10-CM

## 2014-11-10 DIAGNOSIS — I10 Essential (primary) hypertension: Secondary | ICD-10-CM

## 2014-11-10 DIAGNOSIS — R252 Cramp and spasm: Secondary | ICD-10-CM

## 2014-11-10 LAB — BASIC METABOLIC PANEL
BUN: 17 mg/dL (ref 6–23)
CO2: 31 mEq/L (ref 19–32)
Calcium: 9.1 mg/dL (ref 8.4–10.5)
Chloride: 105 mEq/L (ref 96–112)
Creatinine, Ser: 1.12 mg/dL (ref 0.40–1.50)
GFR: 85.23 mL/min (ref >60.00)
Glucose, Bld: 126 mg/dL — ABNORMAL HIGH (ref 70–99)
Potassium: 4.2 mEq/L (ref 3.5–5.1)
Sodium: 138 mEq/L (ref 135–145)

## 2014-11-10 LAB — MAGNESIUM: Magnesium: 2.1 mg/dL (ref 1.5–2.5)

## 2014-11-10 MED ORDER — AMLODIPINE BESYLATE 10 MG PO TABS: 10.0000 mg | ORAL_TABLET | Freq: Every day | ORAL | Status: DC

## 2014-11-10 MED ORDER — HYDRALAZINE HCL 50 MG PO TABS: 50.0000 mg | ORAL_TABLET | Freq: Three times a day (TID) | ORAL | Status: DC

## 2014-11-10 MED ORDER — LISINOPRIL 20 MG PO TABS: 20.0000 mg | ORAL_TABLET | Freq: Every day | ORAL | Status: DC

## 2014-11-10 MED ORDER — POTASSIUM CHLORIDE ER 10 MEQ PO TBCR: 10.0000 meq | EXTENDED_RELEASE_TABLET | Freq: Every day | ORAL | Status: DC

## 2014-11-10 MED ORDER — MAGNESIUM OXIDE -MG SUPPLEMENT 200 MG PO TABS: ORAL_TABLET | ORAL | Status: DC

## 2014-11-10 MED ORDER — HYDROCHLOROTHIAZIDE 50 MG PO TABS: 50.0000 mg | ORAL_TABLET | Freq: Every day | ORAL | Status: DC

## 2014-11-10 NOTE — Patient Instructions (Addendum)
We will be checking the following labs today BMET & Mg  I have refilled all your medicines today - please go to the drug store and pick up and start taking  Follow this medicine list  See the pharmacist here on Monday for a BP check - take your medicines that morning.  Continue to restrict your salt.  See Dr. Aundra Dubin in 6 months  Call the [place] office at [phone] if you have any questions, problems or concerns.

## 2014-11-10 NOTE — Progress Notes (Signed)
CARDIOLOGY OFFICE NOTE  Date:  11/10/2014    Thomas Bowman Date of Birth: 05/20/1951 Medical Record #[id number]  PCP:  No PCP Per Patient  Cardiologist:  Aundra Dubin    Chief Complaint  Patient presents with  . Hypertension    6 week check - seen for Dr. Aundra Dubin     History of Present Illness: Thomas Bowman is a 64 y.o. male who presents today for a 6 week check. Seen for Dr. Aundra Dubin. He has a history of untreated HTN. In 7/15, he developed chest pressure at work while under stress. He went to the ER. SBP was well over 200. He was admitted and started on amlodipine. Cardiac enzymes were negative and Cardiolite was done. This was a normal study. CTA chest was negative for PE.   Seen back in October of 2015 - medicines adjusted again for BP - Lisinopril was started. Using lots of sodium. Referred for sleep study as well. I have seen him back several times since to adjust his medicines. Last seen in early December with a medicine increase at that time.   Comes back today. Here alone. Doing ok. Feels ok. BP is quite high. He has ran out of Hydralazine and what sounds like his Lisinopril. Says he can't get refilled. He feels "just fine" with no chest pain. Not short of breath. No headache. Probably still getting too much salt.   Past Medical History  Diagnosis Date  . Bone cancer 90s  . Hypertension     Past Surgical History  Procedure Laterality Date  . Facial nerve surgery Right   . Appendectomy       Medications: Current Outpatient Prescriptions  Medication Sig Dispense Refill  . amLODipine (NORVASC) 10 MG tablet Take 1 tablet (10 mg total) by mouth daily. 90 tablet 3  . aspirin EC 81 MG EC tablet Take 1 tablet (81 mg total) by mouth daily. (Patient taking differently: Take 162 mg by mouth daily. ) 30 tablet 1  . hydrochlorothiazide (HYDRODIURIL) 50 MG tablet Take 1 tablet (50 mg total) by mouth daily. 90 tablet 3  . lisinopril (PRINIVIL,ZESTRIL) 20 MG tablet Take 1  tablet (20 mg total) by mouth daily. 90 tablet 3  . Magnesium Oxide 200 MG TABS 1 tablet daily 90 tablet 3  . potassium chloride (K-DUR) 10 MEQ tablet Take 1 tablet (10 mEq total) by mouth daily. 90 tablet 3  . hydrALAZINE (APRESOLINE) 50 MG tablet Take 1 tablet (50 mg total) by mouth 3 (three) times daily. 270 tablet 3   No current facility-administered medications for this visit.    Allergies: No Known Allergies  Social History: The patient  reports that he has never smoked. He does not have any smokeless tobacco history on file. He reports that he does not drink alcohol or use illicit drugs.   Family History: The patient's family history includes Cancer in his father and mother.   Review of Systems: Please see the history of present illness.   All other systems are reviewed and negative.   Physical Exam: VS:  BP 190/100 mmHg  Pulse 73  Ht 5' 6.5" (1.689 m)  Wt 186 lb 1.9 oz (84.423 kg)  BMI 29.59 kg/m2  SpO2 97% .  BMI Body mass index is 29.59 kg/(m^2). General: Pleasant. Well developed, well nourished and in no acute distress.  HEENT: Normal. Neck: Supple, no JVD, carotid bruits, or masses noted.  Cardiac: Regular rate and rhythm. No murmurs, rubs, or gallops.  No edema.  Respiratory:  Lungs are clear to auscultation bilaterally with normal work of breathing.  GI: Soft and nontender.  MS: No deformity or atrophy. Gait and ROM intact. Skin: Warm and dry. Color is normal.  Neuro:  Strength and sensation are intact and no gross focal deficits noted.  Psych: Alert, appropriate and with normal affect.   Wt Readings from Last 3 Encounters:  11/10/14 186 lb 1.9 oz (84.423 kg)  09/21/14 181 lb 12.8 oz (82.464 kg)  09/10/14 180 lb (81.647 kg)    LABORATORY DATA:  EKG:  EKG is not ordered today.   Lab Results  Component Value Date   WBC 4.4 05/16/2014   HGB 14.5 05/16/2014   HCT 41.7 05/16/2014   PLT 180 05/16/2014   GLUCOSE 113* 09/21/2014   CHOL 178 05/17/2014    TRIG 104 05/17/2014   HDL 45 05/17/2014   LDLCALC 112* 05/17/2014   ALT 14 04/24/2010   AST 28 04/24/2010   NA 132* 09/21/2014   K 4.1 09/21/2014   CL 99 09/21/2014   CREATININE 1.2 09/21/2014   BUN 27* 09/21/2014   CO2 29 09/21/2014   HGBA1C 6.2* 05/17/2014    BNP (last 3 results) No results for input(s): PROBNP in the last 8760 hours.    Other Studies Reviewed Today:   MYOCARDIAL IMAGING WITH SPECT (REST AND EXERCISE)  GATED LEFT VENTRICULAR WALL MOTION STUDY  LEFT VENTRICULAR EJECTION FRACTION  TECHNIQUE: Standard myocardial SPECT imaging was performed after resting intravenous injection of 10 mCi Tc-25m sestamibi. Subsequently, exercise tolerance test was performed by the patient under the supervision of the Cardiology staff. At peak-stress, 30 mCi Tc-24m sestamibi was injected intravenously and standard myocardial SPECT imaging was performed. Quantitative gated imaging was also performed to evaluate left ventricular wall motion, and estimate left ventricular ejection fraction.  COMPARISON: One-view chest x-ray from the same day.  FINDINGS: Myocardial uptake is within normal limits. There are no fixed reversible defects.  Gated imaging demonstrates normal contractility and wall thickening.  The estimated diastolic volume on gated imaging is 162 mL. The estimated systolic volume is 65 mL. The calculated ejection fraction is 64%.  IMPRESSION: 1. No evidence for inducible ischemia. 2. Normal contractility. 3. Normal ejection fraction of 64%.   Electronically Signed  By: Lawrence Santiago M.D.  On: 05/17/2014 16:14   SLEEP STUDY IMPRESSION/ RECOMMENDATION:  1. No significant sleep disordered breathing noted. 2. No evidence of periodic limb movement disorder or REM sleep disorders. 3. No significant O2 desaturations during sleep.  4. Good Sleep Efficiency  5. The patient did have mild snoring so referral to ENT for evaluation of  surgical causes of snoring could be considered.  6. The patient should be counseled in good sleep hygiene.   [place], American Board of Sleep Medicine   Assessment / Plan:  1. HTN - BP by me today is 180/100. He has had no medicines today. Has ran out of 2. I have sent in all his refills. Increasing the dose of Hydralazine to 50 mg TID. He understands the need to go to the pharmacy this morning to pick all up and start taking as directed by his AVS. He will be sent to the HTN clinic here with pharmacy - Soyla Dryer, PharmD - who has seen the patient as well today.   2. Prior chest pain - negative Myoview back in July OF 2015 - no symptoms reported  3. Negative sleep study  Current medicines are reviewed with  the patient today.  We have discussed this at length today. Restarting as noted above.   The following changes have been made:  See above.  Labs/ tests ordered today include:   Orders Placed This Encounter  Procedures  . Basic metabolic panel  . Magnesium    Disposition:   FU with pharmacy in HTN clinic for the next 4 months and then with Dr. Aundra Dubin in 6 months  Patient is agreeable to this plan and will call if any problems develop in the interim.   Signed: Burtis Junes, RN, ANP-C 11/10/2014 8:36 AM  [facility] [address] [place], [place]  [address] Phone: [phone] Fax: [phone]

## 2014-11-10 NOTE — Telephone Encounter (Signed)
S/w pharm tech pt has 6 medications that are being filled. Truitt Merle, NP, wanted me to check to [facility] to make sure pt's medications were being filled.

## 2014-11-15 ENCOUNTER — Ambulatory Visit (INDEPENDENT_AMBULATORY_CARE_PROVIDER_SITE_OTHER): Payer: Medicaid Other | Admitting: Pharmacist

## 2014-11-15 ENCOUNTER — Ambulatory Visit: Payer: Self-pay | Admitting: Pharmacist

## 2014-11-15 ENCOUNTER — Encounter: Payer: Self-pay | Admitting: Pharmacist

## 2014-11-15 VITALS — BP 162/82 | Wt 184.0 lb

## 2014-11-15 DIAGNOSIS — I10 Essential (primary) hypertension: Secondary | ICD-10-CM

## 2014-11-15 NOTE — Patient Instructions (Signed)
It was nice meeting you today.  Work on increasing dedicated exercise time - 30 minutes of walking 3-4 times each week Work on cooking at home and reducing high salt things from your diet (follow instructions of DASH diet when possible) Continue taking all your medicines as prescribed If you continue to have any problems with any of your medicines, please call and let us know. [phone]

## 2014-11-15 NOTE — Progress Notes (Signed)
Pharmacist Hypertension Clinic - Resident Research Study  S/O: Thomas Bowman is a 64 y.o. male presenting to clinic today for initial visit for HTN study enrollment and referred to hypertension clinic by Truitt Merle, NP.  Pt reports missing doses of meds only rarely - states he only misses about 1 dose per month and he has family members who keep him.  Primarily takes meds at night. Takes hydralazine approximately at 1200, 1600, and 0000.  Per NP note on 11/10/14, pt had run out of hydralazine and lisinopril for some period of time leading up to that office visit.  Hydralazine dose increased during that visit (25mg  TID to 50mg  TID).  Pt reports some cramps/aches in hands and legs since resuming BP meds last week.  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  Current HTN meds: Amlodipine 10mg  daily Lisinopril 20mg  daily HCTZ 50mg  daily Hydralazine 50mg  TID (increased from 25mg  TID on 11/10/14)  Diet: Primarily drinks water, some hot chocolate, recently reduced drinking soda Typically only eats 1-2 meals/day    Breakfast: only occasionally eats breakfast, mostly [place] - oatmeal, scrambled eggs, fruit    Lunch/Dinner: eats out often, ribs, pork chops, chicken, few veggies    Snacks: oranges or grapes, "junk" - candy, sugar-free gummies, chocolate, potato chips  Exercise: reports previous walking 5-10 miles every day.  Currently no walking or other exercise d/t prepping for an auction to sell his tools/equipment from self-owned shop (also in part d/t bad weather conditions).  FH: mother died from cancer 75yo, dad died from cancer 84yo, sister died from cancer 21yo, no significant CV hx of which the pt is aware  SH: denies tobacco and alcohol use, pt is a self-employed Dealer soon to retire  [facility] Readings from Last 3 Encounters:   11/15/14 184 lb (83.462 kg)  11/10/14 186 lb 1.9 oz (84.423 kg)  09/21/14 181 lb 12.8 oz (82.464 kg)   BP Readings from Last 3 Encounters:  11/10/14 190/100  09/21/14 170/90  08/23/14 164/100   Pulse Readings from Last 3 Encounters:  11/10/14 73  09/21/14 58  08/23/14 62    Renal function: Estimated Creatinine Clearance: 70 mL/min (by C-G formula based on Cr of 1.12).  Outpatient Encounter Prescriptions as of 11/15/2014  Medication Sig  . amLODipine (NORVASC) 10 MG tablet Take 1 tablet (10 mg total) by mouth daily.  Marland Kitchen aspirin EC 81 MG EC tablet Take 1 tablet (81 mg total) by mouth daily. (Patient taking differently: Take 162 mg by mouth daily. )  . hydrALAZINE (APRESOLINE) 50 MG tablet Take 1 tablet (50 mg total) by mouth 3 (three) times daily.  . hydrochlorothiazide (HYDRODIURIL) 50 MG tablet Take 1 tablet (50 mg total) by mouth daily.  Marland Kitchen lisinopril (PRINIVIL,ZESTRIL) 20 MG tablet Take 1 tablet (20 mg total) by mouth daily.  . Magnesium Oxide 200 MG TABS 1 tablet daily  . potassium chloride (K-DUR) 10 MEQ tablet Take 1 tablet (10 mEq total) by mouth daily.    Allergies: No Known Allergies  A/P: Pt has qualified for study enrollment and has signed informed consent. Pt was randomized to study group: Standard of care/clinic visits  Pt remains well-above BP goal of < 140/90, but improved from visit last week.  His hydralazine dose was increased last week and BP has improved some.  Today we spent significant time discussing importance  of med compliance as well as diet and exercise changes to help improve BP.  Continue taking all BP meds as prescribed (no changes today) Work on increasing dedicated exercise time again Decrease eating out when possible and reducing sodium in diet Pt will let us know if he continues to experience cramps/aches which he attributes to BP meds  Future visits may consider assessing med compliance, lifestyle changes, possibly increasing lisinopril, or  adding additional agent (bisoprolol, nebivolol, or spironolactone).  Drucie Opitz, PharmD Clinical Pharmacy Resident

## 2014-11-16 NOTE — Progress Notes (Signed)
Please sign and close your note

## 2014-12-16 ENCOUNTER — Ambulatory Visit (INDEPENDENT_AMBULATORY_CARE_PROVIDER_SITE_OTHER): Payer: Medicaid Other | Admitting: Pharmacist

## 2014-12-16 ENCOUNTER — Encounter: Payer: Self-pay | Admitting: Pharmacist

## 2014-12-16 VITALS — BP 160/86 | Wt 184.0 lb

## 2014-12-16 DIAGNOSIS — R252 Cramp and spasm: Secondary | ICD-10-CM

## 2014-12-16 DIAGNOSIS — I10 Essential (primary) hypertension: Secondary | ICD-10-CM

## 2014-12-16 MED ORDER — LISINOPRIL 40 MG PO TABS: 40.0000 mg | ORAL_TABLET | Freq: Every day | ORAL | Status: DC

## 2014-12-16 NOTE — Progress Notes (Signed)
Pharmacist Hypertension Clinic - Resident Research Study  S/O: Thomas Bowman is a 64 y.o. African American male returning to clinic today for 4wk F/U visit for HTN study, referred to hypertension clinic by Thomas Merle, NP (Dr. Claris Bowman pt).  Per NP note on 11/10/14, pt had run out of hydralazine and lisinopril for some period of time leading up to that office visit.  Hydralazine dose increased during that visit (25mg  TID to 50mg  TID).  Pt reported some cramps/aches in hands and legs after increasing hydralazine dose while other meds remained the same.  These cramps/aches have gotten progressively worse since his initial visit 4wk ago and have caused him to decrease exercise as well as decrease compliance.  Current HTN meds: Amlodipine 10mg  daily Lisinopril 20mg  daily HCTZ 50mg  daily Hydralazine 50mg  TID (increased from 25mg  TID on 11/10/14)  Diet: Primarily drinks water, some hot chocolate, recently reduced drinking soda Typically only eats 1-2 meals/day    Breakfast: only occasionally eats breakfast, mostly [place] - oatmeal, scrambled eggs, fruit    Lunch/Dinner: eats out often, ribs, pork chops, chicken, few veggies    Snacks: oranges or grapes, "junk" - candy, sugar-free gummies, chocolate, potato chips Pt reports eating at home more recently.  Cooking w/ garlic powder, BBQ seasoning (unsure of sodium content), grilling more, decreased fried foods, increased salads over the past few weeks.  Exercise: reports previous walking 5-10 miles every day.  Currently no walking or other exercise d/t cramps and aches in hands and legs as well as prepping for an auction to sell his tools/equipment from self-owned shop (also in part d/t bad weather conditions).  FH: mother died from cancer 3yo, dad died from cancer 67yo, sister died from cancer 78yo, no significant CV hx of which the pt is aware  SH: denies tobacco and alcohol use, pt is a self-employed Dealer soon to retire  [facility] Readings  from Last 3 Encounters:  12/16/14 184 lb (83.462 kg)  11/15/14 184 lb (83.462 kg)  11/10/14 186 lb 1.9 oz (84.423 kg)   BP Readings from Last 3 Encounters:  12/16/14 160/86  11/15/14 162/82  11/10/14 190/100   Pulse Readings from Last 3 Encounters:  11/10/14 73  09/21/14 58  08/23/14 62    Renal function: CrCl ~80 mL/min, lytes ok  Outpatient Encounter Prescriptions as of 12/16/2014  Medication Sig  . amLODipine (NORVASC) 10 MG tablet Take 1 tablet (10 mg total) by mouth daily.  Marland Kitchen aspirin EC 81 MG EC tablet Take 1 tablet (81 mg total) by mouth daily. (Patient taking differently: Take 162 mg by mouth daily. )  . hydrALAZINE (APRESOLINE) 50 MG tablet Take 1 tablet (50 mg total) by mouth 3 (three) times daily.  . hydrochlorothiazide (HYDRODIURIL) 50 MG tablet Take 1 tablet (50 mg total) by mouth daily.  Marland Kitchen lisinopril (PRINIVIL,ZESTRIL) 40 MG tablet Take 1 tablet (40 mg total) by mouth daily.  . Magnesium Oxide 200 MG TABS 1 tablet daily  . potassium chloride (K-DUR) 10 MEQ tablet Take 1 tablet (10 mEq total) by mouth daily.  . [DISCONTINUED] lisinopril (PRINIVIL,ZESTRIL) 20 MG tablet Take 1 tablet (20 mg total) by mouth daily.    Allergies: No Known Allergies  A/P: Pt has qualified for study enrollment and has signed informed consent. Pt was randomized to study group: Standard of care/clinic visits  Pt remains well-above BP goal of < 140/90.  Based on the timing of his increased hydralazine dose while other meds remained the same, it appears that the  hydralazine may be causing his hand and leg cramps.  I also have compliance concerns with his somewhat atypical daily schedule and the requirement for TID dosing.  Increase to lisinopril 40mg  daily Discontinue hydralazine Increase exercise again as muscle cramps resolve Check BMET in 2 weeks F/U in hypertension clinic in 4 weeks  Future visits may consider assessing med compliance, lifestyle changes, and possibly adding  additional agent (bisoprolol, nebivolol, or spironolactone) if additional BP lowering is required to reach goal.  Drucie Opitz, PharmD Clinical Pharmacy Resident

## 2014-12-16 NOTE — Patient Instructions (Signed)
Stop taking hydralazine Increase lisinopril to 40mg  each day Work on increasing exercise again as muscle cramps start going away Check lab work in 2 weeks Follow-up office visit with me in 4 weeks

## 2014-12-31 ENCOUNTER — Other Ambulatory Visit (INDEPENDENT_AMBULATORY_CARE_PROVIDER_SITE_OTHER): Payer: Medicaid Other | Admitting: *Deleted

## 2014-12-31 DIAGNOSIS — I10 Essential (primary) hypertension: Secondary | ICD-10-CM

## 2014-12-31 LAB — BASIC METABOLIC PANEL
BUN: 17 mg/dL (ref 6–23)
CALCIUM: 9.4 mg/dL (ref 8.4–10.5)
CO2: 28 mEq/L (ref 19–32)
Chloride: 100 mEq/L (ref 96–112)
Creatinine, Ser: 1.13 mg/dL (ref 0.40–1.50)
GFR: 84.32 mL/min (ref >60.00)
Glucose, Bld: 119 mg/dL — ABNORMAL HIGH (ref 70–99)
Potassium: 3.7 mEq/L (ref 3.5–5.1)
Sodium: 133 mEq/L — ABNORMAL LOW (ref 135–145)

## 2014-12-31 NOTE — Addendum Note (Signed)
Addended by: Eulis Foster on: 12/31/2014 09:28 AM   Modules accepted: Orders

## 2015-01-13 ENCOUNTER — Ambulatory Visit: Payer: Medicaid Other | Admitting: Pharmacist

## 2015-01-14 ENCOUNTER — Ambulatory Visit: Payer: Medicaid Other | Admitting: Pharmacist

## 2015-01-18 ENCOUNTER — Ambulatory Visit (INDEPENDENT_AMBULATORY_CARE_PROVIDER_SITE_OTHER): Payer: Medicaid Other | Admitting: Pharmacist

## 2015-01-18 ENCOUNTER — Encounter: Payer: Self-pay | Admitting: Pharmacist

## 2015-01-18 VITALS — BP 168/94 | Wt 179.5 lb

## 2015-01-18 DIAGNOSIS — I10 Essential (primary) hypertension: Secondary | ICD-10-CM

## 2015-01-18 MED ORDER — SPIRONOLACTONE 25 MG PO TABS: 25.0000 mg | ORAL_TABLET | Freq: Every day | ORAL | Status: DC

## 2015-01-18 NOTE — Patient Instructions (Signed)
Start taking spironolactone 25mg  daily Continue taking all your other medicines as prescribed Continue to work on increasing your exercise and weight loss Follow up lab work on 4/7 (lab opens at 7:30am) Follow up with me in clinic on 4/26 @ 2:30pm

## 2015-01-18 NOTE — Progress Notes (Signed)
Pharmacist [facility]pertension [facility] - Resident Research Study  S/O: Thomas Bowman is a 64 y.o. African American male returning to clinic today for 8wk F/U visit for HTN study, referred to hypertension clinic by Truitt Merle, NP ([name]).  Per NP note on 11/10/14, pt had run out of hydralazine and lisinopril for some period of time leading up to that office visit.  Hydralazine dose increased during that visit (25mg  TID to 50mg  TID).  Pt reported some cramps/aches in hands and legs after increasing hydralazine dose while other meds remained the same.  These cramps/aches got progressively worse since his initial HTN study visit on 11/15/14 and caused him to decrease exercise as well as decrease compliance.  Hydralazine was discontinued during HTN study visit on 12/16/14.  Pt reports that the cramps/aches have completely resolved with the discontinuation of hydralazine.  As a result, he has been able to increase his walking again.  Of note, he has lost 5 lbs since he last study visit.  Current HTN meds: Amlodipine 10mg  daily Lisinopril 40mg  daily HCTZ 50mg  daily  Diet: Primarily drinks water, some hot chocolate, recently reduced drinking soda Typically only eats 1-2 meals/day    Breakfast: only occasionally eats breakfast, mostly [place] - oatmeal, scrambled eggs, fruit    Lunch/Dinner: eats out often, ribs, pork chops, chicken, few veggies    Snacks: oranges or grapes, "junk" - candy, sugar-free gummies, chocolate, potato chips Pt reports eating at home more recently.  Cooking w/ garlic powder, BBQ seasoning (unsure of sodium content), grilling more, decreased fried foods, increased salads over the past few weeks.  Exercise: reports previous walking 5-10 miles every day.  He is no longer walking as much as previously, but he has increased exercise since cramps/aches have resolved with discontinuation of hydralazine.  FH: mother died from cancer 83yo, dad died from cancer 52yo, sister died  from cancer 48yo, no significant CV hx of which the pt is aware  SH: denies tobacco and alcohol use, pt is a self-employed Dealer soon to retire  [employer] Readings from Last 3 Encounters:  01/18/15 179 lb 8 oz (81.421 kg)  12/16/14 184 lb (83.462 kg)  11/15/14 184 lb (83.462 kg)   BP Readings from Last 3 Encounters:  01/18/15 168/94  12/16/14 160/86  11/15/14 162/82   Pulse Readings from Last 3 Encounters:  11/10/14 73  09/21/14 58  08/23/14 62    Renal function: CrCl ~80 mL/min, K 3.7, otherwise wnl (12/31/2014)  Outpatient Encounter Prescriptions as of 01/18/2015  Medication Sig  . amLODipine (NORVASC) 10 MG tablet Take 1 tablet (10 mg total) by mouth daily.  Marland Kitchen aspirin EC 81 MG EC tablet Take 1 tablet (81 mg total) by mouth daily. (Patient taking differently: Take 162 mg by mouth daily. )  . hydrochlorothiazide (HYDRODIURIL) 50 MG tablet Take 1 tablet (50 mg total) by mouth daily.  Marland Kitchen lisinopril (PRINIVIL,ZESTRIL) 40 MG tablet Take 1 tablet (40 mg total) by mouth daily.  . Magnesium Oxide 200 MG TABS 1 tablet daily  . potassium chloride (K-DUR) 10 MEQ tablet Take 1 tablet (10 mEq total) by mouth daily.    Allergies: No Known Allergies  A/P: Pt has qualified for study enrollment and has signed informed consent. Pt was randomized to study group: Standard of care/clinic visits  Pt remains well-above BP goal of < 140/90.  His spirits seem better with the resolution of his cramps/aches after discontinuing the hydralazine.  Since pt has good renal function and his potassium  is on the low side of normal, will try adding spironolactone.  Start taking spironolactone 25mg  daily Continue taking all other meds as prescribed Continue to work on increasing exercise Check BMET 01/27/2015 F/U in hypertension clinic in 4 weeks  Future visits may consider assessing med compliance, lifestyle changes, and titration of spironolactone if additional BP lowering is required to reach goal.  Drucie Opitz, PharmD Clinical Pharmacy Resident

## 2015-01-27 ENCOUNTER — Other Ambulatory Visit: Payer: Medicaid Other

## 2015-02-03 ENCOUNTER — Other Ambulatory Visit (INDEPENDENT_AMBULATORY_CARE_PROVIDER_SITE_OTHER): Payer: Medicaid Other | Admitting: *Deleted

## 2015-02-03 DIAGNOSIS — I10 Essential (primary) hypertension: Secondary | ICD-10-CM

## 2015-02-04 ENCOUNTER — Telehealth: Payer: Self-pay | Admitting: *Deleted

## 2015-02-04 LAB — BASIC METABOLIC PANEL
BUN: 19 mg/dL (ref 6–23)
CO2: 28 mEq/L (ref 19–32)
Calcium: 9.3 mg/dL (ref 8.4–10.5)
Chloride: 104 mEq/L (ref 96–112)
Creatinine, Ser: 1.17 mg/dL (ref 0.40–1.50)
GFR: 80.98 mL/min (ref >60.00)
Glucose, Bld: 110 mg/dL — ABNORMAL HIGH (ref 70–99)
Potassium: 3.9 mEq/L (ref 3.5–5.1)
Sodium: 137 mEq/L (ref 135–145)

## 2015-02-04 NOTE — Telephone Encounter (Signed)
Patient informed. 

## 2015-02-04 NOTE — Telephone Encounter (Signed)
-----   Message from Larey Dresser, MD sent at 02/04/2015  1:31 PM EDT ----- Labs ok

## 2015-02-15 ENCOUNTER — Ambulatory Visit (INDEPENDENT_AMBULATORY_CARE_PROVIDER_SITE_OTHER): Payer: Medicaid Other | Admitting: Pharmacist

## 2015-02-25 ENCOUNTER — Encounter: Payer: Self-pay | Admitting: Pharmacist

## 2015-02-25 ENCOUNTER — Ambulatory Visit (INDEPENDENT_AMBULATORY_CARE_PROVIDER_SITE_OTHER): Payer: Self-pay | Admitting: Pharmacist

## 2015-02-25 VITALS — BP 134/82 | HR 75 | Wt 178.5 lb

## 2015-02-25 DIAGNOSIS — I10 Essential (primary) hypertension: Secondary | ICD-10-CM

## 2015-02-25 NOTE — Patient Instructions (Signed)
It has been a pleasure working with you the past few months.  Continue taking all your medicines as prescribed Be sure to pick up refills on time when you run out Continue care with your primary care doctor and Dr. Aundra Dubin

## 2015-02-25 NOTE — Progress Notes (Signed)
Pharmacist Hypertension Clinic - Resident Research Study  S/O: Thomas Bowman is a 64 y.o. African American male returning to clinic today for 12wk F/U visit for HTN study, referred to hypertension clinic by Thomas Merle, NP (Dr. Claris Bowman pt).  Per NP note on 11/10/14, pt had run out of hydralazine and lisinopril for some period of time leading up to that office visit.  Hydralazine dose increased during that visit (25mg  TID to 50mg  TID).  Pt reported some cramps/aches in hands and legs after increasing hydralazine dose while other meds remained the same.  These cramps/aches got progressively worse since his initial HTN study visit on 11/15/14 and caused him to decrease exercise as well as decrease compliance.  Hydralazine was discontinued during HTN study visit on 12/16/14.  Pt reports that the cramps/aches have completely resolved with the discontinuation of hydralazine.  As a result, he has been able to increase his walking again.  Current HTN meds: Amlodipine 10mg  daily Lisinopril 40mg  daily HCTZ 50mg  daily Spironolactone 25mg  daily  I asked Thomas Bowman to bring meds into the office today, 02/25/2015.  He had multiple bottles of many different strengths, and some that dated back to 2012.  He also gave a long story indicating that he has his meds stored between two different homes, so some of his other meds or other strength tablets may be there (not brought in today).  I called his pharmacy and it sounds as though he is not taking his meds as prescribed.  I am concerned he likely has very poor health literacy and quite possibly unable to read.  This may be contributing to his previous poor control of his BP and irregularity in taking meds as prescribed.  Diet: Primarily drinks water, some hot chocolate, recently reduced drinking soda Typically only eats 1-2 meals/day    Breakfast: only occasionally eats breakfast, mostly [place] - oatmeal, scrambled eggs, fruit    Lunch/Dinner: eats out often,  ribs, pork chops, chicken, few veggies    Snacks: oranges or grapes, "junk" - candy, sugar-free gummies, chocolate, potato chips Pt reports eating at home more recently.  Cooking w/ garlic powder, BBQ seasoning (unsure of sodium content), grilling more, decreased fried foods, increased salads over the past few weeks.  Exercise: reports previous walking 5-10 miles every day.  He is no longer walking as much as previously, but he has increased exercise since cramps/aches have resolved with discontinuation of hydralazine.  FH: mother died from cancer 6yo, dad died from cancer 74yo, sister died from cancer 29yo, no significant CV hx of which the pt is aware  SH: denies tobacco and alcohol use, pt is a self-employed Dealer soon to retire  [employer] from Last 3 Encounters:  02/25/15 178 lb 8 oz (80.967 kg)  01/18/15 179 lb 8 oz (81.421 kg)  12/16/14 184 lb (83.462 kg)   BP Readings from Last 3 Encounters:  02/25/15 134/82  01/18/15 168/94  12/16/14 160/86   Pulse Readings from Last 3 Encounters:  02/25/15 75  11/10/14 73  09/21/14 58    Renal function: CrCl ~80 mL/min, K 3.9, otherwise wnl (02/03/2015)  Current Outpatient Prescriptions on File Prior to Visit  Medication Sig Dispense Refill  . amLODipine (NORVASC) 10 MG tablet Take 1 tablet (10 mg total) by mouth daily. 90 tablet 3  . aspirin EC 81 MG EC tablet Take 1 tablet (81 mg total) by mouth daily. (Patient taking differently: Take 162 mg by mouth daily. ) 30 tablet 1  .  hydrochlorothiazide (HYDRODIURIL) 50 MG tablet Take 1 tablet (50 mg total) by mouth daily. 90 tablet 3  . lisinopril (PRINIVIL,ZESTRIL) 40 MG tablet Take 1 tablet (40 mg total) by mouth daily. 90 tablet 1  . Magnesium Oxide 200 MG TABS 1 tablet daily 90 tablet 3  . potassium chloride (K-DUR) 10 MEQ tablet Take 1 tablet (10 mEq total) by mouth daily. 90 tablet 3  . spironolactone (ALDACTONE) 25 MG tablet Take 1 tablet (25 mg total) by mouth daily. 30 tablet 3    No current facility-administered medications on file prior to visit.     Allergies: Allergies  Allergen Reactions  . Hydralazine Hcl     Pt experienced severe cramps and aches in hands and legs when dose increased from 25 TID to 50 TID. Resolved upon discontinuation of drug.    A/P: Pt has qualified for study enrollment and has signed informed consent. Pt was randomized to study group: Standard of care/clinic visits  Pt is now at BP goal of < 140/90.  He has seen marked improvement with the addition of spironolactone - and possibly better adherence.  I thoroughly reviewed his meds with him today, including discussing his currently prescribed meds and doses.  He also asked me to dispose of his expired meds and the meds he is no longer prescribed - which I have done.  Continue taking all meds as prescribed Continue to work on increasing exercise and improving diet (DASH diet) Continue care with PCP and cardiologist  Future visits should first assess medication adherence (to both dose and schedule) before adjusting medications.  Additional options for BP improvement may include lifestyle changes, and titration of spironolactone.  Thomas Bowman, PharmD Clinical Pharmacy Resident

## 2015-05-03 ENCOUNTER — Encounter (HOSPITAL_COMMUNITY): Payer: Self-pay | Admitting: Emergency Medicine

## 2015-05-03 ENCOUNTER — Emergency Department (HOSPITAL_COMMUNITY)
Admission: EM | Admit: 2015-05-03 | Discharge: 2015-05-03 | Disposition: A | Discharge status: Home / Self Care | Payer: Medicaid Other | Attending: Emergency Medicine | Admitting: Emergency Medicine

## 2015-05-03 ENCOUNTER — Emergency Department (HOSPITAL_COMMUNITY): Payer: Medicaid Other

## 2015-05-03 DIAGNOSIS — X58XXXA Exposure to other specified factors, initial encounter: Secondary | ICD-10-CM | POA: Diagnosis not present

## 2015-05-03 DIAGNOSIS — Y9389 Activity, other specified: Secondary | ICD-10-CM | POA: Insufficient documentation

## 2015-05-03 DIAGNOSIS — Z79899 Other long term (current) drug therapy: Secondary | ICD-10-CM | POA: Diagnosis not present

## 2015-05-03 DIAGNOSIS — Y998 Other external cause status: Secondary | ICD-10-CM | POA: Insufficient documentation

## 2015-05-03 DIAGNOSIS — S6991XA Unspecified injury of right wrist, hand and finger(s), initial encounter: Secondary | ICD-10-CM | POA: Diagnosis present

## 2015-05-03 DIAGNOSIS — Z7982 Long term (current) use of aspirin: Secondary | ICD-10-CM | POA: Diagnosis not present

## 2015-05-03 DIAGNOSIS — M25539 Pain in unspecified wrist: Secondary | ICD-10-CM

## 2015-05-03 DIAGNOSIS — Z85830 Personal history of malignant neoplasm of bone: Secondary | ICD-10-CM | POA: Diagnosis not present

## 2015-05-03 DIAGNOSIS — I10 Essential (primary) hypertension: Secondary | ICD-10-CM | POA: Insufficient documentation

## 2015-05-03 DIAGNOSIS — Y9289 Other specified places as the place of occurrence of the external cause: Secondary | ICD-10-CM | POA: Insufficient documentation

## 2015-05-03 MED ORDER — HYDROCHLOROTHIAZIDE 50 MG PO TABS
50.0000 mg | ORAL_TABLET | Freq: Every day | ORAL | Status: DC
  Administered 2015-05-03: 50 mg via ORAL
  Filled 2015-05-03: qty 1

## 2015-05-03 MED ORDER — AMLODIPINE BESYLATE 10 MG PO TABS
10.0000 mg | ORAL_TABLET | Freq: Once | ORAL | Status: AC
  Administered 2015-05-03: 10 mg via ORAL
  Filled 2015-05-03: qty 1

## 2015-05-03 NOTE — ED Provider Notes (Signed)
CSN: [id number]     Arrival date & time 05/03/15  1719 History  This chart was scribed for non-physician practitioner Delsa Grana working with Orpah Greek, MD by Meriel Pica, ED Scribe. This patient was seen in room WTR7/WTR7 and the patient's care was started at 6:26 PM.   Chief Complaint  Patient presents with  . Wrist Injury   Patient is a 64 y.o. male presenting with wrist injury. The history is provided by the patient. No language interpreter was used.  Wrist Injury Location:  Wrist Time since incident:  7 hours Injury: yes   Mechanism of injury comment:  Handcuffs  Wrist location:  R wrist Pain details:    Quality: soreness.   Radiates to:  Does not radiate   Severity:  Moderate   Onset quality:  Sudden   Duration:  7 hours   Timing:  Constant   Progression:  Unchanged Chronicity:  New Dislocation: no   Foreign body present:  No foreign bodies Relieved by:  Nothing Worsened by:  Movement Ineffective treatments:  None tried   HPI Comments: Thomas Bowman is a 64 y.o. male, with a PMhx of HTN and bone cancer, who presents to the Emergency Department complaining of constant, 8.5/10 right wrist soreness s/p trauma. Pt reports he was handcuffed forcefully 7 hours ago. Pt also attributes left wrist pain to the incident as of being in the ED presently. He notes the pain is exacerbated with twisting and bending bilateral wrists. He states he has not tried any alleviating OTC pain medication, saying he does not believe in pain pills. Denies any past surgery to bilateral wrists.   Past Medical History  Diagnosis Date  . Bone cancer 90s  . Hypertension    Past Surgical History  Procedure Laterality Date  . Facial nerve surgery Right   . Appendectomy     Family History  Problem Relation Age of Onset  . Cancer Mother   . Cancer Father    History  Substance Use Topics  . Smoking status: Never Smoker   . Smokeless tobacco: Not on file  . Alcohol Use: No     Review of Systems  Musculoskeletal: Positive for arthralgias.   Allergies  Hydralazine hcl  Home Medications   Prior to Admission medications   Medication Sig Start Date End Date Taking? Authorizing Provider  amLODipine (NORVASC) 10 MG tablet Take 1 tablet (10 mg total) by mouth daily. 11/10/14  Yes Burtis Junes, NP  aspirin EC 81 MG EC tablet Take 1 tablet (81 mg total) by mouth daily. Patient taking differently: Take 162 mg by mouth daily.  05/18/14  Yes Orson Eva, MD  hydrochlorothiazide (HYDRODIURIL) 50 MG tablet Take 1 tablet (50 mg total) by mouth daily. 11/10/14  Yes Burtis Junes, NP  lisinopril (PRINIVIL,ZESTRIL) 40 MG tablet Take 1 tablet (40 mg total) by mouth daily. 12/16/14  Yes Larey Dresser, MD  spironolactone (ALDACTONE) 25 MG tablet Take 1 tablet (25 mg total) by mouth daily. 01/18/15  Yes Larey Dresser, MD  Magnesium Oxide 200 MG TABS 1 tablet daily Patient not taking: Reported on 05/03/2015 11/10/14   Burtis Junes, NP  potassium chloride (K-DUR) 10 MEQ tablet Take 1 tablet (10 mEq total) by mouth daily. Patient not taking: Reported on 05/03/2015 11/10/14   Burtis Junes, NP   BP 179/107 mmHg  Pulse 89  Temp(Src) 98.2 F (36.8 C) (Oral)  Resp 18  SpO2 98% Physical Exam  Constitutional:  He is oriented to person, place, and time. He appears well-developed and well-nourished. No distress.  HENT:  Head: Normocephalic and atraumatic.  Right Ear: External ear normal.  Left Ear: External ear normal.  Mouth/Throat: No oropharyngeal exudate.  Eyes: Conjunctivae are normal. Pupils are equal, round, and reactive to light. Right eye exhibits no discharge. Left eye exhibits no discharge. No scleral icterus.  Neck: No JVD present.  Cardiovascular: Normal rate, regular rhythm and normal heart sounds.   Radial pulses 2+ bilaterally.   Pulmonary/Chest: Effort normal and breath sounds normal. No respiratory distress.  Musculoskeletal: He exhibits tenderness. He  exhibits no edema.  TTP over lateral right wrist. Normal flexion and extension.   Lymphadenopathy:    He has no cervical adenopathy.  Neurological: He is alert and oriented to person, place, and time. Coordination normal.  NV intact. Normal strength and sensation.   Skin: Skin is warm and dry. No rash noted. He is not diaphoretic. No erythema. No pallor.  Psychiatric: He has a normal mood and affect. His behavior is normal.  Nursing note and vitals reviewed.   ED Course  Procedures  DIAGNOSTIC STUDIES: Oxygen Saturation is 98% on RA, normal by my interpretation.    COORDINATION OF CARE: 6:30 PM Discussed treatment plan which includes to order diagnostic imaging of right wrist with pt. Pt acknowledges and agrees to plan.   Imaging Review Dg Wrist Complete Right  05/03/2015   CLINICAL DATA:  Wrist pain after trauma.  Initial encounter.  EXAM: RIGHT WRIST - COMPLETE 3+ VIEW  COMPARISON:  None.  FINDINGS: Vascular calcifications. No acute fracture or dislocation. Scaphoid intact. Joint spaces are maintained for age.  IMPRESSION: No acute osseous abnormality.   Electronically Signed   By: Abigail Miyamoto M.D.   On: 05/03/2015 18:58    MDM   Final diagnoses:  Wrist pain    Patient with right wrist pain after being handcuffed at the [facility] Films to rule out any fracture Patient has good movement, no snuffbox tenderness, normal range of motion, neurovascularly intact, no visible signs of trauma.  Films are negative for any fracture, patient refusing any pain medicine, will discharge home with findings Pt BP is 188/80, pt is complaining of pain in his right wrist, he is angry about being handcuffed, and he states he did not take his BP meds today.  Will give home meds here in ED.  He denies CP, SOB, HA, visual changes, abdominal pain.  Pt was given home dose of HTN meds, and was discharged home in stable condition.  I personally performed the services described in this documentation, which  was scribed in my presence. The recorded information has been reviewed and is accurate.    Delsa Grana, PA-C 05/10/15 [place], MD 05/12/15 [phone]

## 2015-05-03 NOTE — ED Notes (Signed)
Called to alert pharmacy to need for medications

## 2015-05-03 NOTE — Discharge Instructions (Signed)

## 2015-05-03 NOTE — ED Notes (Signed)
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 Pt states the cops put the handcuffs too tightly on his wrist and now his right wrist is hurting. No obvious injury to wrist observed in triage.

## 2015-05-12 ENCOUNTER — Encounter (HOSPITAL_COMMUNITY): Payer: Self-pay | Admitting: *Deleted

## 2015-05-12 ENCOUNTER — Emergency Department (HOSPITAL_COMMUNITY)
Admission: EM | Admit: 2015-05-12 | Discharge: 2015-05-12 | Disposition: A | Discharge status: Home / Self Care | Payer: Medicaid Other | Attending: Emergency Medicine | Admitting: Emergency Medicine

## 2015-05-12 DIAGNOSIS — Z85830 Personal history of malignant neoplasm of bone: Secondary | ICD-10-CM | POA: Diagnosis not present

## 2015-05-12 DIAGNOSIS — X58XXXA Exposure to other specified factors, initial encounter: Secondary | ICD-10-CM | POA: Insufficient documentation

## 2015-05-12 DIAGNOSIS — Y999 Unspecified external cause status: Secondary | ICD-10-CM | POA: Insufficient documentation

## 2015-05-12 DIAGNOSIS — I10 Essential (primary) hypertension: Secondary | ICD-10-CM | POA: Diagnosis not present

## 2015-05-12 DIAGNOSIS — Y939 Activity, unspecified: Secondary | ICD-10-CM | POA: Insufficient documentation

## 2015-05-12 DIAGNOSIS — Y929 Unspecified place or not applicable: Secondary | ICD-10-CM | POA: Insufficient documentation

## 2015-05-12 DIAGNOSIS — M25531 Pain in right wrist: Secondary | ICD-10-CM | POA: Diagnosis present

## 2015-05-12 DIAGNOSIS — S63501A Unspecified sprain of right wrist, initial encounter: Secondary | ICD-10-CM | POA: Diagnosis not present

## 2015-05-12 DIAGNOSIS — Z79899 Other long term (current) drug therapy: Secondary | ICD-10-CM | POA: Insufficient documentation

## 2015-05-12 DIAGNOSIS — Z7982 Long term (current) use of aspirin: Secondary | ICD-10-CM | POA: Insufficient documentation

## 2015-05-12 NOTE — Discharge Instructions (Signed)
RICE: Routine Care for Injuries The routine care of many injuries includes Rest, Ice, Compression, and Elevation (RICE). HOME CARE INSTRUCTIONS  Rest is needed to allow your body to heal. Routine activities can usually be resumed when comfortable. Injured tendons and bones can take up to 6 weeks to heal. Tendons are the cord-like structures that attach muscle to bone.  Ice following an injury helps keep the swelling down and reduces pain.  Put ice in a plastic bag.  Place a towel between your skin and the bag.  Leave the ice on for 15-20 minutes, 3-4 times a day, or as directed by your health care provider. Do this while awake, for the first 24 to 48 hours. After that, continue as directed by your caregiver.  Compression helps keep swelling down. It also gives support and helps with discomfort. If an elastic bandage has been applied, it should be removed and reapplied every 3 to 4 hours. It should not be applied tightly, but firmly enough to keep swelling down. Watch fingers or toes for swelling, bluish discoloration, coldness, numbness, or excessive pain. If any of these problems occur, remove the bandage and reapply loosely. Contact your caregiver if these problems continue.  Elevation helps reduce swelling and decreases pain. With extremities, such as the arms, hands, legs, and feet, the injured area should be placed near or above the level of the heart, if possible. SEEK IMMEDIATE MEDICAL CARE IF:  You have persistent pain and swelling.  You develop redness, numbness, or unexpected weakness.  Your symptoms are getting worse rather than improving after several days. These symptoms may indicate that further evaluation or further X-rays are needed. Sometimes, X-rays may not show a small broken bone (fracture) until 1 week or 10 days later. Make a follow-up appointment with your caregiver. Ask when your X-ray results will be ready. Make sure you get your X-ray results. Document Released:  01/20/2001 Document Revised: 10/13/2013 Document Reviewed: 03/09/2011 [facility] Patient Information 2015 [place], [place]. This information is not intended to replace advice given to you by your health care provider. Make sure you discuss any questions you have with your health care provider.  Wrist Pain A wrist sprain happens when the bands of tissue that hold the wrist joints together (ligament) stretch too much or tear. A wrist strain happens when muscles or bands of tissue that connect muscles to bones (tendons) are stretched or pulled. HOME CARE  Put ice on the injured area.  Put ice in a plastic bag.  Place a towel between your skin and the bag.  Leave the ice on for 15-20 minutes, 03-04 times a day, for the first 2 days.  Raise (elevate) the injured wrist to lessen puffiness (swelling).  Rest the injured wrist for at least 48 hours or as told by your doctor.  Wear a splint, cast, or an elastic wrap as told by your doctor.  Only take medicine as told by your doctor.  Follow up with your doctor as told. This is important. GET HELP RIGHT AWAY IF:   The fingers are puffy, very red, white, or cold and blue.  The fingers lose feeling (numb) or tingle.  The pain gets worse.  It is hard to move the fingers. MAKE SURE YOU:   Understand these instructions.  Will watch your condition.  Will get help right away if you are not doing well or get worse. Document Released: 03/26/2008 Document Revised: 12/31/2011 Document Reviewed: 11/29/2010 [facility] Patient Information 2015 [place], [place]. This information  is not intended to replace advice given to you by your health care provider. Make sure you discuss any questions you have with your health care provider.

## 2015-05-12 NOTE — ED Provider Notes (Signed)
CSN: [id number]     Arrival date & time 05/12/15  1916 History  This chart was scribed for non-physician practitioner, Antonietta Breach, PA-C, working with Quintella Reichert, MD, by Peyton Bottoms ED Scribe. This patient was seen in room [place] and the patient's care was started at 8:18 PM    Chief Complaint  Patient presents with  . Wrist Pain   Patient is a 64 y.o. male presenting with wrist pain. The history is provided by the patient. No language interpreter was used.  Wrist Pain This is a recurrent problem. The current episode started more than 1 week ago. The problem occurs constantly. The problem has not changed since onset.Pertinent negatives include no chest pain, no abdominal pain, no headaches and no shortness of breath. Nothing aggravates the symptoms. Nothing relieves the symptoms. He has tried nothing for the symptoms.   HPI Comments: Thomas Bowman is a 64 y.o. male with a PMHx of bone cancer and hypertension, who presents to the Emergency Department complaining of persistent right wrist pain that is consistent with the pain he was seen with 2 weeks ago in the ED. Patient has not taken any medications. He describes his pain to be aching and cramping. He states he had onset of pain after being handcuffed. He has sensation in all his fingers of right hand. Pt states that he "does not believe in medicine" and thus has not taken any meds.  Past Medical History  Diagnosis Date  . Bone cancer 90s  . Hypertension    Past Surgical History  Procedure Laterality Date  . Facial nerve surgery Right   . Appendectomy     Family History  Problem Relation Age of Onset  . Cancer Mother   . Cancer Father    History  Substance Use Topics  . Smoking status: Never Smoker   . Smokeless tobacco: Not on file  . Alcohol Use: No   Review of Systems  Respiratory: Negative for shortness of breath.   Cardiovascular: Negative for chest pain.  Gastrointestinal: Negative for abdominal pain.   Musculoskeletal:       Right wrist pain and cramping  Neurological: Negative for headaches.  All other systems reviewed and are negative.  Allergies  Hydralazine hcl  Home Medications   Prior to Admission medications   Medication Sig Start Date End Date Taking? Authorizing Provider  amLODipine (NORVASC) 10 MG tablet Take 1 tablet (10 mg total) by mouth daily. 11/10/14   Burtis Junes, NP  aspirin EC 81 MG EC tablet Take 1 tablet (81 mg total) by mouth daily. Patient taking differently: Take 162 mg by mouth daily.  05/18/14   Orson Eva, MD  hydrochlorothiazide (HYDRODIURIL) 50 MG tablet Take 1 tablet (50 mg total) by mouth daily. 11/10/14   Burtis Junes, NP  lisinopril (PRINIVIL,ZESTRIL) 40 MG tablet Take 1 tablet (40 mg total) by mouth daily. 12/16/14   Larey Dresser, MD  Magnesium Oxide 200 MG TABS 1 tablet daily Patient not taking: Reported on 05/03/2015 11/10/14   Burtis Junes, NP  potassium chloride (K-DUR) 10 MEQ tablet Take 1 tablet (10 mEq total) by mouth daily. Patient not taking: Reported on 05/03/2015 11/10/14   Burtis Junes, NP  spironolactone (ALDACTONE) 25 MG tablet Take 1 tablet (25 mg total) by mouth daily. 01/18/15   Larey Dresser, MD   Triage Vitals: BP 179/85 mmHg  Pulse 57  Temp(Src) 98.1 F (36.7 C) (Oral)  Resp 16  SpO2 99%  Physical Exam  Constitutional: He is oriented to person, place, and time. He appears well-developed and well-nourished. No distress.  HENT:  Head: Normocephalic and atraumatic.  Eyes: Conjunctivae and EOM are normal. No scleral icterus.  Neck: Normal range of motion.  Cardiovascular: Normal rate, regular rhythm and intact distal pulses.   Distal radial pulse 2+ in the right upper extremity  Pulmonary/Chest: Effort normal. No respiratory distress.  Musculoskeletal: Normal range of motion.       Right wrist: He exhibits tenderness. He exhibits normal range of motion, no swelling, no effusion, no crepitus and no deformity.        Right hand: Normal.  Mildly positive Finkelstein's test. Negative Tinel's test and Phalen's test.  Neurological: He is alert and oriented to person, place, and time. He exhibits normal muscle tone. Coordination normal.  Sensation to light touch intact in all digits of right hand. Patient able to wiggle all fingers.  Skin: Skin is warm and dry. No rash noted. He is not diaphoretic. No erythema. No pallor.  Psychiatric: He has a normal mood and affect. His behavior is normal.  Nursing note and vitals reviewed.  ED Course  Procedures (including critical care time)  DIAGNOSTIC STUDIES: Oxygen Saturation is 99% on RA, normal by my interpretation.    COORDINATION OF CARE: 8:22 PM- Discussed plans to apply brace to stabilize thumb and wrist. Pt expressed interest in seeing ortho. Will give pt referral to Dr. Ninfa Linden. Pt advised of plan for treatment and pt agrees.  Labs Review Labs Reviewed - No data to display  Imaging Review Dg Wrist Complete Right  05/03/2015   CLINICAL DATA:  Wrist pain after trauma.  Initial encounter.  EXAM: RIGHT WRIST - COMPLETE 3+ VIEW  COMPARISON:  None.  FINDINGS: Vascular calcifications. No acute fracture or dislocation. Scaphoid intact. Joint spaces are maintained for age.  IMPRESSION: No acute osseous abnormality.   Electronically Signed   By: Abigail Miyamoto M.D.   On: 05/03/2015 18:58     EKG Interpretation None     MDM   Final diagnoses:  Wrist sprain, right, initial encounter    64 year old male presents to the emergency department for further evaluation of persistent right wrist pain. Patient is neurovascularly intact. No evidence of septic joint. Question DeQuervain's tenosynovitis given mildly positive Finkelstein's test. Patient to be managed with thumb spica brace and icing. He declines any prescriptions for NSAIDs. Have advised the use of over-the-counter Advil as needed. Patient given referral to orthopedic hand specialist for follow-up. Return  precautions discussed and provided. Patient discharged in good condition with no unadd concerns.  I personally performed the services described in this documentation, which was scribed in my presence. The recorded information has been reviewed and is accurate.   Filed Vitals:   05/12/15 2015  BP: 179/85  Pulse: 57  Temp: 98.1 F (36.7 C)  Resp: [address], PA-C 05/12/15 2033  Quintella Reichert, MD 05/13/15 [phone]

## 2015-05-12 NOTE — ED Notes (Signed)
Pt reports being seen here x 2 weeks ago for his R wrist.  States that "the tech put the brace on too tight."  No swelling or deformity noted.  Pt denies injury at this time.

## 2015-07-06 IMAGING — CR DG CHEST 2V
2 series · 2 of 2 positions shown · non-contrast
Comparison: 04/24/2010

CLINICAL DATA: Right-sided chest pain

EXAM:
CHEST  2 VIEW

[w chest pa]
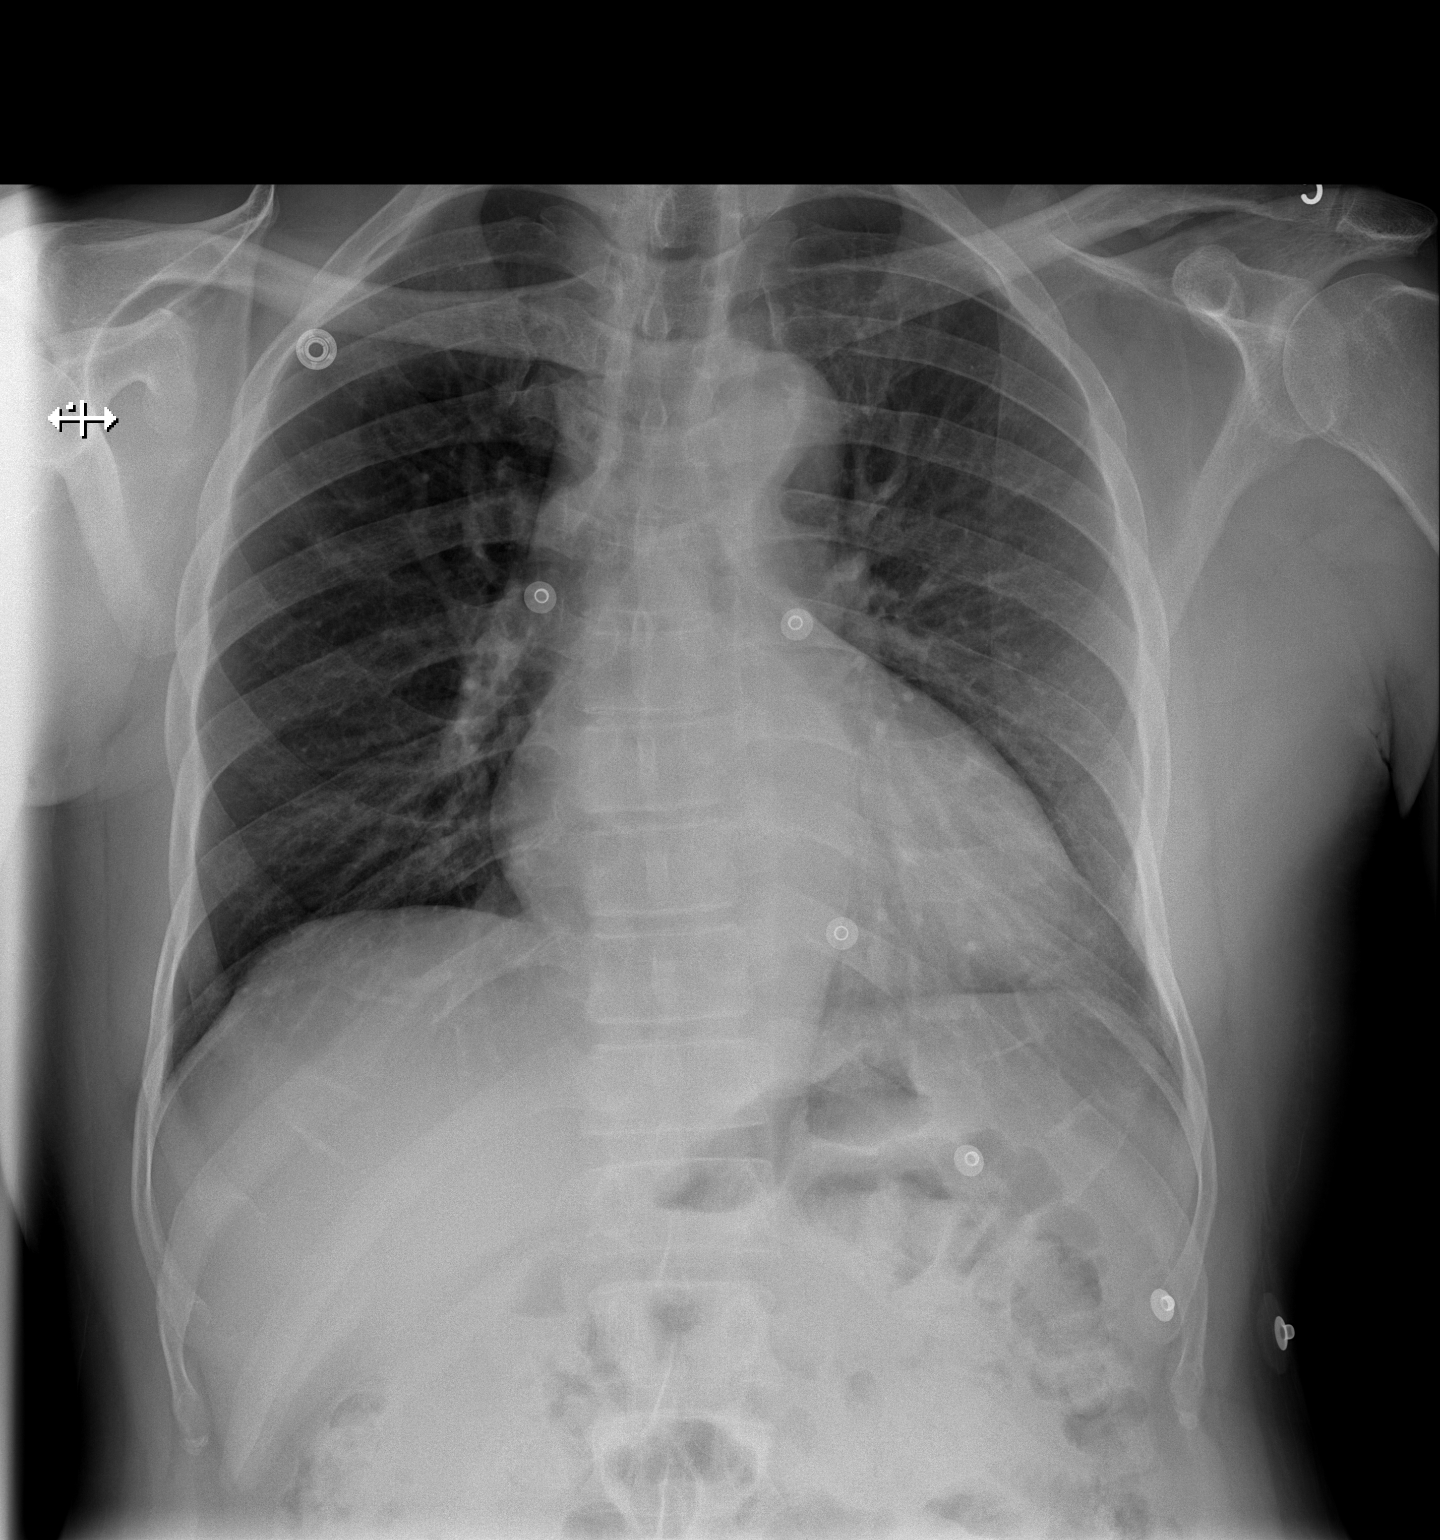

[w chest lat]
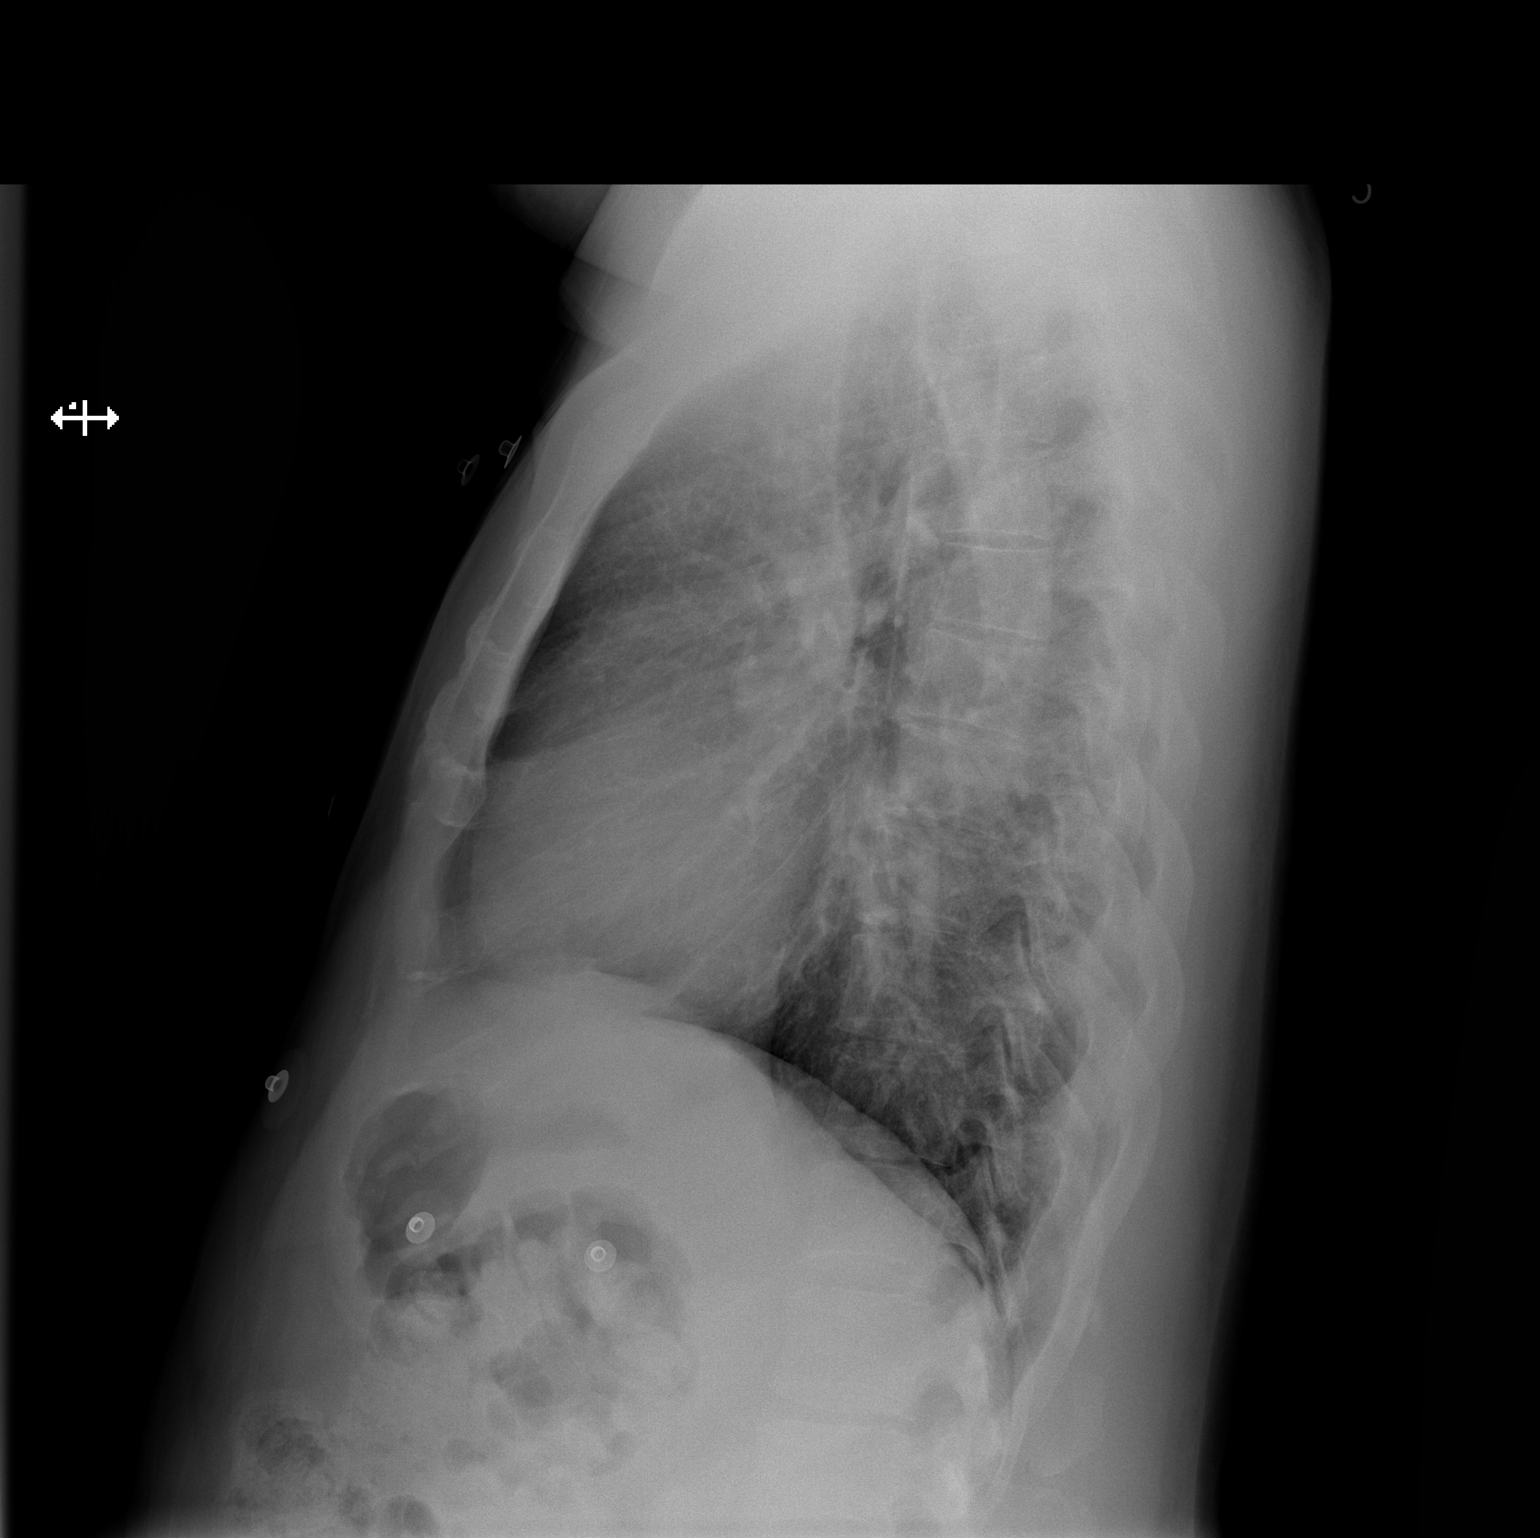

[2 of 2 positions shown; findings below may reference images not displayed]

FINDINGS: The lungs are clear without focal infiltrate, edema, pneumothorax or
pleural effusion. The cardio pericardial silhouette is enlarged.
Imaged bony structures of the thorax are intact.
IMPRESSION: No acute cardiopulmonary findings.

## 2015-07-27 ENCOUNTER — Ambulatory Visit (INDEPENDENT_AMBULATORY_CARE_PROVIDER_SITE_OTHER): Payer: Medicaid Other | Admitting: Nurse Practitioner

## 2015-07-27 ENCOUNTER — Other Ambulatory Visit: Payer: Self-pay | Admitting: Nurse Practitioner

## 2015-07-27 ENCOUNTER — Encounter: Payer: Self-pay | Admitting: Nurse Practitioner

## 2015-07-27 VITALS — BP 210/110 | HR 58 | Resp 18 | Ht 72.0 in | Wt 182.0 lb

## 2015-07-27 DIAGNOSIS — I10 Essential (primary) hypertension: Secondary | ICD-10-CM | POA: Diagnosis not present

## 2015-07-27 DIAGNOSIS — G4719 Other hypersomnia: Secondary | ICD-10-CM | POA: Diagnosis not present

## 2015-07-27 DIAGNOSIS — R252 Cramp and spasm: Secondary | ICD-10-CM | POA: Diagnosis not present

## 2015-07-27 DIAGNOSIS — Z9114 Patient's other noncompliance with medication regimen: Secondary | ICD-10-CM

## 2015-07-27 LAB — BASIC METABOLIC PANEL
BUN: 21 mg/dL (ref 7–25)
CO2: 26 mmol/L (ref 20–31)
Calcium: 8.8 mg/dL (ref 8.6–10.3)
Chloride: 105 mmol/L (ref 98–110)
Creat: 1.26 mg/dL — ABNORMAL HIGH (ref 0.70–1.25)
Glucose, Bld: 123 mg/dL — ABNORMAL HIGH (ref 65–99)
Potassium: 4.3 mmol/L (ref 3.5–5.3)
Sodium: 137 mmol/L (ref 135–146)

## 2015-07-27 MED ORDER — POTASSIUM CHLORIDE ER 10 MEQ PO TBCR: 10.0000 meq | EXTENDED_RELEASE_TABLET | Freq: Every day | ORAL | Status: DC

## 2015-07-27 MED ORDER — SPIRONOLACTONE 25 MG PO TABS: 25.0000 mg | ORAL_TABLET | Freq: Every day | ORAL | Status: DC

## 2015-07-27 MED ORDER — AMLODIPINE BESYLATE 10 MG PO TABS
10.0000 mg | ORAL_TABLET | Freq: Every day | ORAL | Status: DC
Start: 2015-07-27 — End: 2016-05-29

## 2015-07-27 MED ORDER — LISINOPRIL 40 MG PO TABS: 40.0000 mg | ORAL_TABLET | Freq: Every day | ORAL | Status: DC

## 2015-07-27 MED ORDER — MAGNESIUM OXIDE -MG SUPPLEMENT 200 MG PO TABS: ORAL_TABLET | ORAL | Status: DC

## 2015-07-27 MED ORDER — HYDROCHLOROTHIAZIDE 50 MG PO TABS: 50.0000 mg | ORAL_TABLET | Freq: Every day | ORAL | Status: DC

## 2015-07-27 NOTE — Patient Instructions (Addendum)
We will be checking the following labs today - BMET  Medication Instructions:    Continue with your current medicines.   I have sent all your refills to the pharmacy today    Testing/Procedures To Be Arranged:  N/A  Follow-Up:   See me in 4 months  Nurse visit for BP check in about a week    Other Special Instructions:   N/A  Call the [place] office at [phone] if you have any questions, problems or concerns.

## 2015-07-27 NOTE — Progress Notes (Signed)
CARDIOLOGY OFFICE NOTE  Date:  07/27/2015    Thomas Bowman Date of Birth: 06-29-1951 Medical Record #[id number]  PCP:  No PCP Per Patient  Cardiologist:  Aundra Dubin    Chief Complaint  Patient presents with  . Hypertension    6 month check - seen for Dr. Aundra Dubin    History of Present Illness: Thomas Bowman is a 64 y.o. male who presents today for a follow up visit. Seen for Dr. Aundra Dubin. He has a history of long standing HTN. In 7/15, he developed chest pressure at work while under stress. He went to the ER. SBP was well over 200. He was admitted and started on amlodipine. Cardiac enzymes were negative and Cardiolite was done. This was a normal study. CTA chest was negative for PE.   I have seen him several times trying to get his BP down. He was referred to the HTN clinic here. Has been seen by Gay Filler several times - not able to tolerate Hydralazine due to cramps. ?of his literacy. She has gone thru multiple bottles that he had and tried to simplify. Bp has come down with aldactone.   Comes back today. Here alone. Says he feels fine. No chest pain. Breathing ok. No headache. BP quite high - tells me he could not get his medicines refilled.   Past Medical History  Diagnosis Date  . Bone cancer ([place]) 90s  . Hypertension     Past Surgical History  Procedure Laterality Date  . Facial nerve surgery Right   . Appendectomy       Medications: Current Outpatient Prescriptions  Medication Sig Dispense Refill  . amLODipine (NORVASC) 10 MG tablet Take 1 tablet (10 mg total) by mouth daily. 90 tablet 3  . aspirin EC 81 MG EC tablet Take 1 tablet (81 mg total) by mouth daily. (Patient taking differently: Take 162 mg by mouth daily. ) 30 tablet 1  . hydrochlorothiazide (HYDRODIURIL) 50 MG tablet Take 1 tablet (50 mg total) by mouth daily. 90 tablet 3  . lisinopril (PRINIVIL,ZESTRIL) 40 MG tablet Take 1 tablet (40 mg total) by mouth daily. 90 tablet 3  . Magnesium Oxide 200 MG TABS 1  tablet daily 90 tablet 3  . potassium chloride (K-DUR) 10 MEQ tablet Take 1 tablet (10 mEq total) by mouth daily. 90 tablet 3  . spironolactone (ALDACTONE) 25 MG tablet Take 1 tablet (25 mg total) by mouth daily. 30 tablet 3   No current facility-administered medications for this visit.    Allergies: Allergies  Allergen Reactions  . Hydralazine Hcl     Pt experienced severe cramps and aches in hands and legs when dose increased from 25 TID to 50 TID. Resolved upon discontinuation of drug.    Social History: The patient  reports that he has never smoked. He does not have any smokeless tobacco history on file. He reports that he does not drink alcohol or use illicit drugs.   Family History: The patient's family history includes Cancer in his father and mother.   Review of Systems: Please see the history of present illness.   Otherwise, the review of systems is positive for none.   All other systems are reviewed and negative.   Physical Exam: VS:  BP 210/110 mmHg  Pulse 58  Resp 18  Ht 6' (1.829 m)  Wt 182 lb (82.555 kg)  BMI 24.68 kg/m2  SpO2 97% .  BMI Body mass index is 24.68 kg/(m^2).  Wt Readings  from Last 3 Encounters:  07/27/15 182 lb (82.555 kg)  02/25/15 178 lb 8 oz (80.967 kg)  01/18/15 179 lb 8 oz (81.421 kg)    General: Pleasant. Well developed, well nourished and in no acute distress.  HEENT: Normal. Neck: Supple, no JVD, carotid bruits, or masses noted.  Cardiac: Regular rate and rhythm. No murmurs, rubs, or gallops. No edema.  Respiratory:  Lungs are clear to auscultation bilaterally with normal work of breathing.  GI: Soft and nontender.  MS: No deformity or atrophy. Gait and ROM intact. Skin: Warm and dry. Color is normal.  Neuro:  Strength and sensation are intact and no gross focal deficits noted.  Psych: Alert, appropriate and with normal affect.   LABORATORY DATA:  EKG:  EKG is not ordered today.  Lab Results  Component Value Date   WBC 4.4  05/16/2014   HGB 14.5 05/16/2014   HCT 41.7 05/16/2014   PLT 180 05/16/2014   GLUCOSE 110* 02/03/2015   CHOL 178 05/17/2014   TRIG 104 05/17/2014   HDL 45 05/17/2014   LDLCALC 112* 05/17/2014   ALT 14 04/24/2010   AST 28 04/24/2010   NA 137 02/03/2015   K 3.9 02/03/2015   CL 104 02/03/2015   CREATININE 1.17 02/03/2015   BUN 19 02/03/2015   CO2 28 02/03/2015   HGBA1C 6.2* 05/17/2014    BNP (last 3 results) No results for input(s): BNP in the last 8760 hours.  ProBNP (last 3 results) No results for input(s): PROBNP in the last 8760 hours.   Other Studies Reviewed Today:  MYOCARDIAL IMAGING WITH SPECT (REST AND EXERCISE) from 04/2014  FINDINGS: Myocardial uptake is within normal limits. There are no fixed reversible defects.  Gated imaging demonstrates normal contractility and wall thickening.  The estimated diastolic volume on gated imaging is 162 mL. The estimated systolic volume is 65 mL. The calculated ejection fraction is 64%.  IMPRESSION: 1. No evidence for inducible ischemia. 2. Normal contractility. 3. Normal ejection fraction of 64%.   Electronically Signed  By: Lawrence Santiago M.D.  On: 05/17/2014 16:14   SLEEP STUDY IMPRESSION/ RECOMMENDATION:  1. No significant sleep disordered breathing noted. 2. No evidence of periodic limb movement disorder or REM sleep disorders. 3. No significant O2 desaturations during sleep.  4. Good Sleep Efficiency  5. The patient did have mild snoring so referral to ENT for evaluation of surgical causes of snoring could be considered.  6. The patient should be counseled in good sleep hygiene.   [place], American Board of Sleep Medicine   Assessment / Plan:  1. HTN - BP high - off of his medicines - not clear why he could not get refilled - he has refills on them but I have sent them all back in today. I have instructed him that when he leaves here he needs to go to the pharmacy and  pick all of his RXs up. he is agreeable. Needs to come for a nurse visit in about a week to recheck him. I will see him back in 4 months.   2. Prior chest pain - negative Myoview back in July OF 2015 - no symptoms reported  3. Negative sleep study  Current medicines are reviewed with the patient today.  The patient does not have concerns regarding medicines other than what has been noted above.  The following changes have been made:  See above.  Labs/ tests ordered today include:    Orders Placed This Encounter  Procedures  . Basic metabolic panel     Disposition:   FU with me in 6 months.   Patient is agreeable to this plan and will call if any problems develop in the interim.   Signed: Burtis Junes, RN, ANP-C 07/27/2015 9:37 AM  [facility] [address] [place], [place]  [address] Phone: [phone] Fax: [phone]

## 2015-08-04 ENCOUNTER — Ambulatory Visit: Payer: Medicaid Other | Admitting: Pharmacist

## 2015-11-29 ENCOUNTER — Ambulatory Visit (INDEPENDENT_AMBULATORY_CARE_PROVIDER_SITE_OTHER): Payer: Medicaid Other | Admitting: Nurse Practitioner

## 2015-11-29 ENCOUNTER — Encounter: Payer: Self-pay | Admitting: Nurse Practitioner

## 2015-11-29 VITALS — BP 190/100 | HR 73 | Ht 66.5 in | Wt 184.4 lb

## 2015-11-29 DIAGNOSIS — Z9114 Patient's other noncompliance with medication regimen: Secondary | ICD-10-CM

## 2015-11-29 DIAGNOSIS — I10 Essential (primary) hypertension: Secondary | ICD-10-CM

## 2015-11-29 LAB — HEPATIC FUNCTION PANEL
ALT: 19 U/L (ref 9–46)
AST: 21 U/L (ref 10–35)
Albumin: 4 g/dL (ref 3.6–5.1)
Alkaline Phosphatase: 72 U/L (ref 40–115)
Bilirubin, Direct: 0.2 mg/dL (ref <=0.2)
Indirect Bilirubin: 0.4 mg/dL (ref 0.2–1.2)
Total Bilirubin: 0.6 mg/dL (ref 0.2–1.2)
Total Protein: 7.4 g/dL (ref 6.1–8.1)

## 2015-11-29 LAB — BASIC METABOLIC PANEL
BUN: 17 mg/dL (ref 7–25)
CO2: 25 mmol/L (ref 20–31)
Calcium: 9 mg/dL (ref 8.6–10.3)
Chloride: 99 mmol/L (ref 98–110)
Creat: 1.12 mg/dL (ref 0.70–1.25)
Glucose, Bld: 154 mg/dL — ABNORMAL HIGH (ref 65–99)
Potassium: 3.8 mmol/L (ref 3.5–5.3)
Sodium: 135 mmol/L (ref 135–146)

## 2015-11-29 LAB — LIPID PANEL
Cholesterol: 181 mg/dL (ref 125–200)
HDL: 47 mg/dL (ref >=40)
LDL Cholesterol: 107 mg/dL (ref <130)
Total CHOL/HDL Ratio: 3.9 Ratio (ref <=5.0)
Triglycerides: 135 mg/dL (ref <150)
VLDL: 27 mg/dL (ref <30)

## 2015-11-29 MED ORDER — CLONIDINE HCL 0.1 MG PO TABS: 0.1000 mg | ORAL_TABLET | Freq: Every day | ORAL | Status: DC

## 2015-11-29 NOTE — Patient Instructions (Addendum)
We will be checking the following labs today - BMET, HPF and Lipids   Medication Instructions:    Continue with your current medicines. But  I am adding Clonidine 0.1 mg to take each night at bedtime - this is at your drug store.     Testing/Procedures To Be Arranged:  N/A  Follow-Up:   See me in about 1 month    Other Special Instructions:   Try to limit your salt    If you need a refill on your cardiac medications before your next appointment, please call your pharmacy.   Call the [place] office at [phone] if you have any questions, problems or concerns.

## 2015-11-29 NOTE — Progress Notes (Signed)
CARDIOLOGY OFFICE NOTE  Date:  11/29/2015    Thomas Bowman Date of Birth: 30-Oct-1950 Medical Record [id number]  PCP:  No PCP Per Patient  Cardiologist:  Aundra Dubin    Chief Complaint  Patient presents with  . Hypertension    Follow up visit - seen for Dr. Aundra Dubin    History of Present Illness: Thomas Bowman is a 65 y.o. male who presents today for a follow up visit. Seen for Dr. Aundra Dubin. He has a history of long standing HTN. In 7/15, he developed chest pressure at work while under stress. He went to the ER. SBP was well over 200. He was admitted and started on amlodipine. Cardiac enzymes were negative and Cardiolite was done. This was a normal study. CTA chest was negative for PE.   I have seen him several times trying to get his BP down. He was referred to the HTN clinic here. Has been seen by Gay Filler several times - not able to tolerate Hydralazine due to cramps. ?of his literacy. She has gone thru multiple bottles that he had and tried to simplify.   I last saw him back in October. BP sky high - he was off his medicines - said he could not get refilled. ?issue of compliance again.   Comes back today. Here alone. Says he feels fine. BP remains high. Says he was down at [facility] and BP was up - they made him lay down for a few hours - he refused to have any more medicine added to his regimen at that time but seems more agreeable today. 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 Getting too much salt. Needs labs.   Past Medical History  Diagnosis Date  . Bone cancer ([place]) 90s  . Hypertension     Past Surgical History  Procedure Laterality Date  . Facial nerve surgery Right   . Appendectomy       Medications: Current Outpatient Prescriptions  Medication Sig Dispense Refill  . amLODipine (NORVASC) 10 MG tablet Take 1 tablet (10 mg total) by mouth daily. 90 tablet 3  . aspirin EC 81 MG EC tablet Take 1 tablet (81 mg total) by mouth  daily. (Patient taking differently: Take 162 mg by mouth daily. ) 30 tablet 1  . hydrochlorothiazide (HYDRODIURIL) 50 MG tablet Take 1 tablet (50 mg total) by mouth daily. 90 tablet 3  . lisinopril (PRINIVIL,ZESTRIL) 40 MG tablet Take 1 tablet (40 mg total) by mouth daily. 90 tablet 3  . Magnesium Oxide 200 MG TABS 1 tablet daily 90 tablet 3  . potassium chloride (K-DUR) 10 MEQ tablet Take 1 tablet (10 mEq total) by mouth daily. 90 tablet 3  . spironolactone (ALDACTONE) 25 MG tablet Take 1 tablet (25 mg total) by mouth daily. 30 tablet 3  . cloNIDine (CATAPRES) 0.1 MG tablet Take 1 tablet (0.1 mg total) by mouth at bedtime. 30 tablet 11   No current facility-administered medications for this visit.    Allergies: Allergies  Allergen Reactions  . Hydralazine Hcl     Pt experienced severe cramps and aches in hands and legs when dose increased from 25 TID to 50 TID. Resolved upon discontinuation of drug.    Social History: The patient  reports that he has never smoked. He does not have any smokeless tobacco history on file. He reports that he does not drink alcohol or use illicit drugs.  Family History: The patient's family history includes Cancer in his father and mother.   Review of Systems: Please see the history of present illness.   Otherwise, the review of systems is positive for none.   All other systems are reviewed and negative.   Physical Exam: VS:  BP 190/100 mmHg  Pulse 73  Ht 5' 6.5" (1.689 m)  Wt 184 lb 6.4 oz (83.643 kg)  BMI 29.32 kg/m2 .  BMI Body mass index is 29.32 kg/(m^2).  Wt Readings from Last 3 Encounters:  11/29/15 184 lb 6.4 oz (83.643 kg)  07/27/15 182 lb (82.555 kg)  02/25/15 178 lb 8 oz (80.967 kg)   BP recheck by me is 170/90.  General: Pleasant. Well developed, well nourished and in no acute distress.  HEENT: Normal but facial deformity. Neck: Supple, no JVD, carotid bruits, or masses noted.  Cardiac: Regular rate and rhythm. No murmurs, rubs,  or gallops. No edema.  Respiratory:  Lungs are clear to auscultation bilaterally with normal work of breathing.  MS: No deformity or atrophy. Gait and ROM intact. Skin: Warm and dry. Color is normal.  Neuro:  Strength and sensation are intact and no gross focal deficits noted.  Psych: Alert, appropriate and with normal affect.   LABORATORY DATA:  EKG:  EKG is ordered today. This demonstrates NSR with nonspecific ST changes.  Lab Results  Component Value Date   WBC 4.4 05/16/2014   HGB 14.5 05/16/2014   HCT 41.7 05/16/2014   PLT 180 05/16/2014   GLUCOSE 123* 07/27/2015   CHOL 178 05/17/2014   TRIG 104 05/17/2014   HDL 45 05/17/2014   LDLCALC 112* 05/17/2014   ALT 14 04/24/2010   AST 28 04/24/2010   NA 137 07/27/2015   K 4.3 07/27/2015   CL 105 07/27/2015   CREATININE 1.26* 07/27/2015   BUN 21 07/27/2015   CO2 26 07/27/2015   HGBA1C 6.2* 05/17/2014    BNP (last 3 results) No results for input(s): BNP in the last 8760 hours.  ProBNP (last 3 results) No results for input(s): PROBNP in the last 8760 hours.   Other Studies Reviewed Today:  MYOCARDIAL IMAGING WITH SPECT (REST AND EXERCISE) from 04/2014  FINDINGS: Myocardial uptake is within normal limits. There are no fixed reversible defects.  Gated imaging demonstrates normal contractility and wall thickening.  The estimated diastolic volume on gated imaging is 162 mL. The estimated systolic volume is 65 mL. The calculated ejection fraction is 64%.  IMPRESSION: 1. No evidence for inducible ischemia. 2. Normal contractility. 3. Normal ejection fraction of 64%.   Electronically Signed  By: Lawrence Santiago M.D.  On: 05/17/2014 16:14   SLEEP STUDY IMPRESSION/ RECOMMENDATION:  1. No significant sleep disordered breathing noted. 2. No evidence of periodic limb movement disorder or REM sleep disorders. 3. No significant O2 desaturations during sleep.  4. Good Sleep Efficiency  5. The patient  did have mild snoring so referral to ENT for evaluation of surgical causes of snoring could be considered.  6. The patient should be counseled in good sleep hygiene.   [place], American Board of Sleep Medicine   Assessment / Plan:  1. HTN - he is agreeable to letting me add Clonidine 0.1 mg to take QHS. See back in one month. Try to limit salt. Will continue with his other medicines. Lab today.   2. Prior chest pain - negative Myoview back in July OF 2015 - no symptoms reported  3. Negative sleep study  Current medicines are reviewed with the patient today.  The patient does not have concerns regarding medicines other than what has been noted above.  The following changes have been made:  See above.  Labs/ tests ordered today include:    Orders Placed This Encounter  Procedures  . Basic metabolic panel  . Hepatic function panel  . Lipid panel  . EKG 12-Lead     Disposition:   FU with me in 4 weeks.    Patient is agreeable to this plan and will call if any problems develop in the interim.   Signed: Burtis Junes, RN, ANP-C 11/29/2015 2:18 PM  [facility] [address] [place], [place]  [address] Phone: [phone] Fax: [phone]

## 2015-12-14 NOTE — Addendum Note (Signed)
Addended by: Burtis Junes on: 12/14/2015 03:11 PM   Modules accepted: Miquel Dunn

## 2016-01-10 ENCOUNTER — Ambulatory Visit: Payer: Medicaid Other | Admitting: Nurse Practitioner

## 2016-01-18 ENCOUNTER — Encounter: Payer: Self-pay | Admitting: Nurse Practitioner

## 2016-02-01 ENCOUNTER — Emergency Department (HOSPITAL_COMMUNITY): Payer: Medicaid Other

## 2016-02-01 ENCOUNTER — Emergency Department (HOSPITAL_COMMUNITY)
Admission: EM | Admit: 2016-02-01 | Discharge: 2016-02-01 | Disposition: A | Discharge status: Home / Self Care | Payer: Medicaid Other | Attending: Emergency Medicine | Admitting: Emergency Medicine

## 2016-02-01 DIAGNOSIS — S61012A Laceration without foreign body of left thumb without damage to nail, initial encounter: Secondary | ICD-10-CM | POA: Diagnosis not present

## 2016-02-01 DIAGNOSIS — Y998 Other external cause status: Secondary | ICD-10-CM | POA: Diagnosis not present

## 2016-02-01 DIAGNOSIS — Z85830 Personal history of malignant neoplasm of bone: Secondary | ICD-10-CM | POA: Diagnosis not present

## 2016-02-01 DIAGNOSIS — Z79899 Other long term (current) drug therapy: Secondary | ICD-10-CM | POA: Insufficient documentation

## 2016-02-01 DIAGNOSIS — I10 Essential (primary) hypertension: Secondary | ICD-10-CM | POA: Insufficient documentation

## 2016-02-01 DIAGNOSIS — Z7982 Long term (current) use of aspirin: Secondary | ICD-10-CM | POA: Insufficient documentation

## 2016-02-01 DIAGNOSIS — T1490XA Injury, unspecified, initial encounter: Secondary | ICD-10-CM

## 2016-02-01 DIAGNOSIS — Y9289 Other specified places as the place of occurrence of the external cause: Secondary | ICD-10-CM | POA: Diagnosis not present

## 2016-02-01 DIAGNOSIS — Y9389 Activity, other specified: Secondary | ICD-10-CM | POA: Diagnosis not present

## 2016-02-01 DIAGNOSIS — W293XXA Contact with powered garden and outdoor hand tools and machinery, initial encounter: Secondary | ICD-10-CM | POA: Insufficient documentation

## 2016-02-01 MED ORDER — HYDROCODONE-ACETAMINOPHEN 5-325 MG PO TABS: 1.0000 | ORAL_TABLET | ORAL | Status: DC | PRN

## 2016-02-01 MED ORDER — CEPHALEXIN 500 MG PO CAPS: 500.0000 mg | ORAL_CAPSULE | Freq: Four times a day (QID) | ORAL | Status: DC

## 2016-02-01 MED ORDER — LIDOCAINE HCL 1 % IJ SOLN
INTRAMUSCULAR | Status: AC
  Administered 2016-02-01: 20 mL
  Filled 2016-02-01: qty 20

## 2016-02-01 NOTE — Discharge Instructions (Signed)
Return here at once if you develop fever, increased swelling to your left thumb, numbness to the thumb, or any other problems  Cast or Splint Care Casts and splints support injured limbs and keep bones from moving while they heal. It is important to care for your cast or splint at home.  HOME CARE INSTRUCTIONS  Keep the cast or splint uncovered during the drying period. It can take 24 to 48 hours to dry if it is made of plaster. A fiberglass cast will dry in less than 1 hour.  Do not rest the cast on anything harder than a pillow for the first 24 hours.  Do not put weight on your injured limb or apply pressure to the cast until your health care provider gives you permission.  Keep the cast or splint dry. Wet casts or splints can lose their shape and may not support the limb as well. A wet cast that has lost its shape can also create harmful pressure on your skin when it dries. Also, wet skin can become infected.  Cover the cast or splint with a plastic bag when bathing or when out in the rain or snow. If the cast is on the trunk of the body, take sponge baths until the cast is removed.  If your cast does become wet, dry it with a towel or a blow dryer on the cool setting only.  Keep your cast or splint clean. Soiled casts may be wiped with a moistened cloth.  Do not place any hard or soft foreign objects under your cast or splint, such as cotton, toilet paper, lotion, or powder.  Do not try to scratch the skin under the cast with any object. The object could get stuck inside the cast. Also, scratching could lead to an infection. If itching is a problem, use a blow dryer on a cool setting to relieve discomfort.  Do not trim or cut your cast or remove padding from inside of it.  Exercise all joints next to the injury that are not immobilized by the cast or splint. For example, if you have a long leg cast, exercise the hip joint and toes. If you have an arm cast or splint, exercise the  shoulder, elbow, thumb, and fingers.  Elevate your injured arm or leg on 1 or 2 pillows for the first 1 to 3 days to decrease swelling and pain.It is best if you can comfortably elevate your cast so it is higher than your heart. SEEK MEDICAL CARE IF:   Your cast or splint cracks.  Your cast or splint is too tight or too loose.  You have unbearable itching inside the cast.  Your cast becomes wet or develops a soft spot or area.  You have a bad smell coming from inside your cast.  You get an object stuck under your cast.  Your skin around the cast becomes red or raw.  You have new pain or worsening pain after the cast has been applied. SEEK IMMEDIATE MEDICAL CARE IF:   You have fluid leaking through the cast.  You are unable to move your fingers or toes.  You have discolored (blue or white), cool, painful, or very swollen fingers or toes beyond the cast.  You have tingling or numbness around the injured area.  You have severe pain or pressure under the cast.  You have any difficulty with your breathing or have shortness of breath.  You have chest pain.   This information is not intended  to replace advice given to you by your health care provider. Make sure you discuss any questions you have with your health care provider.   Document Released: 10/05/2000 Document Revised: 07/29/2013 Document Reviewed: 04/16/2013 Elsevier Interactive Patient Education 2016 [employer] Finger Fracture Fractures of fingers are breaks in the bones of the fingers. There are many types of fractures. There are different ways of treating these fractures. Your health care provider will discuss the best way to treat your fracture. CAUSES Traumatic injury is the main cause of broken fingers. These include:  Injuries while playing sports.  Workplace injuries.  Falls. RISK FACTORS Activities that can increase your risk of finger fractures include:  Sports.  Workplace activities that involve  machinery.  A condition called osteoporosis, which can make your bones less dense and cause them to fracture more easily. SIGNS AND SYMPTOMS The main symptoms of a broken finger are pain and swelling within 15 minutes after the injury. Other symptoms include:  Bruising of your finger.  Stiffness of your finger.  Numbness of your finger.  Exposed bones (compound fracture) if the fracture is severe. DIAGNOSIS  The best way to diagnose a broken bone is with X-ray imaging. Additionally, your health care provider will use this X-ray image to evaluate the position of the broken finger bones.  TREATMENT  Finger fractures can be treated with:   Nonreduction--This means the bones are in place. The finger is splinted without changing the positions of the bone pieces. The splint is usually left on for about a week to 10 days. This will depend on your fracture and what your health care provider thinks.  Closed reduction--The bones are put back into position without using surgery. The finger is then splinted.  Open reduction and internal fixation--The fracture site is opened. Then the bone pieces are fixed into place with pins or some type of hardware. This is seldom required. It depends on the severity of the fracture. HOME CARE INSTRUCTIONS   Follow your health care provider's instructions regarding activities, exercises, and physical therapy.  Only take over-the-counter or prescription medicines for pain, discomfort, or fever as directed by your health care provider. SEEK MEDICAL CARE IF: You have pain or swelling that limits the motion or use of your fingers. SEEK IMMEDIATE MEDICAL CARE IF:  Your finger becomes numb. MAKE SURE YOU:   Understand these instructions.  Will watch your condition.  Will get help right away if you are not doing well or get worse.   This information is not intended to replace advice given to you by your health care provider. Make sure you discuss any questions  you have with your health care provider.   Document Released: 01/20/2001 Document Revised: 07/29/2013 Document Reviewed: 05/20/2013 Elsevier Interactive Patient Education [employer].

## 2016-02-01 NOTE — ED Provider Notes (Signed)
CSN: [id number]     Arrival date & time 02/01/16  1859 History   First MD Initiated Contact with Patient 02/01/16 1909     Chief Complaint  Patient presents with  . Extremity Laceration     (Consider location/radiation/quality/duration/timing/severity/associated sxs/prior Treatment) HPI Comments: Patient here after sustaining injury to his left thumb from a chainsaw. States that he was trying to clean it and it kicked back and went across the DIP joint on the dorsal surface of his left thumb. Denies any distal paresthesias. No nail bed injury. He is able to flex his left thumb at the DIP. He did note immediate bleeding which stopped with direct pressure. Denies any pain at the PIP. Applied direct pressure and transported himself here  The history is provided by the patient.    Past Medical History  Diagnosis Date  . Bone cancer ([place]) 90s  . Hypertension    Past Surgical History  Procedure Laterality Date  . Facial nerve surgery Right   . Appendectomy     Family History  Problem Relation Age of Onset  . Cancer Mother   . Cancer Father    Social History  Substance Use Topics  . Smoking status: Never Smoker   . Smokeless tobacco: Not on file  . Alcohol Use: No    Review of Systems  All other systems reviewed and are negative.     Allergies  Hydralazine hcl  Home Medications   Prior to Admission medications   Medication Sig Start Date End Date Taking? Authorizing Provider  amLODipine (NORVASC) 10 MG tablet Take 1 tablet (10 mg total) by mouth daily. 07/27/15   Burtis Junes, NP  aspirin EC 81 MG EC tablet Take 1 tablet (81 mg total) by mouth daily. Patient taking differently: Take 162 mg by mouth daily.  05/18/14   Orson Eva, MD  cloNIDine (CATAPRES) 0.1 MG tablet Take 1 tablet (0.1 mg total) by mouth at bedtime. 11/29/15   Burtis Junes, NP  hydrochlorothiazide (HYDRODIURIL) 50 MG tablet Take 1 tablet (50 mg total) by mouth daily. 07/27/15   Burtis Junes, NP    lisinopril (PRINIVIL,ZESTRIL) 40 MG tablet Take 1 tablet (40 mg total) by mouth daily. 07/27/15   Burtis Junes, NP  Magnesium Oxide 200 MG TABS 1 tablet daily 07/27/15   Burtis Junes, NP  potassium chloride (K-DUR) 10 MEQ tablet Take 1 tablet (10 mEq total) by mouth daily. 07/27/15   Burtis Junes, NP  spironolactone (ALDACTONE) 25 MG tablet Take 1 tablet (25 mg total) by mouth daily. 07/27/15   Burtis Junes, NP   BP 164/106 mmHg  Pulse 96  Temp(Src) 97.9 F (36.6 C) (Oral)  Resp 20  SpO2 99% Physical Exam  Constitutional: He is oriented to person, place, and time. He appears well-developed and well-nourished.  Non-toxic appearance. No distress.  HENT:  Head: Normocephalic and atraumatic.  Eyes: Conjunctivae, EOM and lids are normal. Pupils are equal, round, and reactive to light.  Neck: Normal range of motion. Neck supple. No tracheal deviation present. No thyroid mass present.  Cardiovascular: Normal rate, regular rhythm and normal heart sounds.  Exam reveals no gallop.   No murmur heard. Pulmonary/Chest: Effort normal and breath sounds normal. No stridor. No respiratory distress. He has no decreased breath sounds. He has no wheezes. He has no rhonchi. He has no rales.  Abdominal: Soft. Normal appearance and bowel sounds are normal. He exhibits no distension. There is no tenderness. There  is no rebound and no CVA tenderness.  Musculoskeletal: Normal range of motion. He exhibits no edema or tenderness.       Hands: Neurological: He is alert and oriented to person, place, and time. He has normal strength. No cranial nerve deficit or sensory deficit. GCS eye subscore is 4. GCS verbal subscore is 5. GCS motor subscore is 6.  Sensation intact the distal end of the left thumb  Skin: Skin is warm and dry. No abrasion and no rash noted.  Psychiatric: He has a normal mood and affect. His speech is normal and behavior is normal.  Nursing note and vitals reviewed.   ED Course   Procedures (including critical care time) Labs Review Labs Reviewed - No data to display  Imaging Review No results found. I have personally reviewed and evaluated these images and lab results as part of my medical decision-making.   EKG Interpretation None      MDM   Final diagnoses:  Trauma   Patient without suturable lacerations here. He has no neuro deficits at the thumb. Tetanus status is up-to-date. Patient given a digital block with 6 mL of 1% lidocaine. Wound explored and no obvious joint involvement. Patient able to extend the top of his thumb. Has normal flexion. Chest x-ray noted and shows comminuted fracture. Discuss with Dr. Lenon Curt, hand surgeon on call, wound will be covered with Vaseline gauze as well as a bandage and with the splint and patient was placed on antibiotics and will follow-up with Dr. Lenon Curt this week. Return precautions given      Lacretia Leigh, MD 02/01/16 2232

## 2016-02-01 NOTE — ED Notes (Signed)
Pt stated "The chainsaw kicked back on me."  Pt presents with laceration to left thumb.

## 2016-02-01 NOTE — ED Notes (Signed)
Dr. Zenia Resides informed of pt's b/p.  No new orders received.

## 2016-02-02 ENCOUNTER — Encounter (HOSPITAL_COMMUNITY): Payer: Self-pay | Admitting: Emergency Medicine

## 2016-02-02 ENCOUNTER — Emergency Department (HOSPITAL_COMMUNITY)
Admission: EM | Admit: 2016-02-02 | Discharge: 2016-02-02 | Disposition: A | Discharge status: Home / Self Care | Payer: Medicaid Other | Attending: Emergency Medicine | Admitting: Emergency Medicine

## 2016-02-02 DIAGNOSIS — S61012D Laceration without foreign body of left thumb without damage to nail, subsequent encounter: Secondary | ICD-10-CM | POA: Diagnosis not present

## 2016-02-02 DIAGNOSIS — W312XXD Contact with powered woodworking and forming machines, subsequent encounter: Secondary | ICD-10-CM | POA: Insufficient documentation

## 2016-02-02 DIAGNOSIS — Z85830 Personal history of malignant neoplasm of bone: Secondary | ICD-10-CM | POA: Diagnosis not present

## 2016-02-02 DIAGNOSIS — Z79899 Other long term (current) drug therapy: Secondary | ICD-10-CM | POA: Diagnosis not present

## 2016-02-02 DIAGNOSIS — Z7982 Long term (current) use of aspirin: Secondary | ICD-10-CM | POA: Diagnosis not present

## 2016-02-02 DIAGNOSIS — I10 Essential (primary) hypertension: Secondary | ICD-10-CM | POA: Diagnosis not present

## 2016-02-02 DIAGNOSIS — Z4801 Encounter for change or removal of surgical wound dressing: Secondary | ICD-10-CM | POA: Diagnosis present

## 2016-02-02 MED ORDER — IBUPROFEN 800 MG PO TABS: 800.0000 mg | ORAL_TABLET | Freq: Three times a day (TID) | ORAL | Status: DC

## 2016-02-02 MED ORDER — IBUPROFEN 800 MG PO TABS
800.0000 mg | ORAL_TABLET | Freq: Once | ORAL | Status: AC
  Administered 2016-02-02: 800 mg via ORAL
  Filled 2016-02-02: qty 1

## 2016-02-02 NOTE — ED Provider Notes (Signed)
CSN: [id number]     Arrival date & time 02/02/16  1027 History   First MD Initiated Contact with Patient 02/02/16 1057     Chief Complaint  Patient presents with  . Follow-up    HPI   Thomas Bowman is a 65 y.o. male with a PMH of HTN who presents to the ED with persistent pain s/p injuring his left thumb. He was evaluated in the ED yesterday and instructed to follow-up with hand, however attempted to do so today and was told he needs a referral from his PCP. Per record review, his wound was irrigated and explored and imaging revealed a comminuted fracture. Hand was consulted, and Dr. Lenon Curt advised to cover with vaseline gauze, bandage, and splint. He was started on keflex. He notes he filled his prescription for this yesterday, though did not pick up his pain medication.    Past Medical History  Diagnosis Date  . Bone cancer ([place]) 90s  . Hypertension    Past Surgical History  Procedure Laterality Date  . Facial nerve surgery Right   . Appendectomy     Family History  Problem Relation Age of Onset  . Cancer Mother   . Cancer Father    Social History  Substance Use Topics  . Smoking status: Never Smoker   . Smokeless tobacco: None  . Alcohol Use: No      Review of Systems  Constitutional: Negative for fever and chills.  Musculoskeletal: Positive for arthralgias.  Skin: Positive for wound.  Neurological: Negative for weakness and numbness.      Allergies  Hydralazine hcl  Home Medications    Prior to Admission medications   Medication Sig Start Date End Date Taking? Authorizing Provider  amLODipine (NORVASC) 10 MG tablet Take 1 tablet (10 mg total) by mouth daily. 07/27/15   Burtis Junes, NP  aspirin EC 81 MG EC tablet Take 1 tablet (81 mg total) by mouth daily. Patient taking differently: Take 162 mg by mouth daily.  05/18/14   Orson Eva, MD  cephALEXin (KEFLEX) 500 MG capsule Take 1 capsule (500 mg total) by mouth 4 (four) times daily. 02/01/16   Lacretia Leigh, MD   cloNIDine (CATAPRES) 0.1 MG tablet Take 1 tablet (0.1 mg total) by mouth at bedtime. 11/29/15   Burtis Junes, NP  hydrochlorothiazide (HYDRODIURIL) 50 MG tablet Take 1 tablet (50 mg total) by mouth daily. 07/27/15   Burtis Junes, NP  HYDROcodone-acetaminophen (NORCO/VICODIN) 5-325 MG tablet Take 1-2 tablets by mouth every 4 (four) hours as needed. 02/01/16   Lacretia Leigh, MD  ibuprofen (ADVIL,MOTRIN) 800 MG tablet Take 1 tablet (800 mg total) by mouth 3 (three) times daily. 02/02/16   Marella Chimes, PA-C  lisinopril (PRINIVIL,ZESTRIL) 40 MG tablet Take 1 tablet (40 mg total) by mouth daily. 07/27/15   Burtis Junes, NP  Magnesium Oxide 200 MG TABS 1 tablet daily 07/27/15   Burtis Junes, NP  potassium chloride (K-DUR) 10 MEQ tablet Take 1 tablet (10 mEq total) by mouth daily. 07/27/15   Burtis Junes, NP  spironolactone (ALDACTONE) 25 MG tablet Take 1 tablet (25 mg total) by mouth daily. 07/27/15   Burtis Junes, NP    BP 160/70 mmHg  Pulse 61  Temp(Src) 98.7 F (37.1 C) (Oral)  Resp 17  SpO2 100% Physical Exam  Constitutional: He is oriented to person, place, and time. He appears well-developed and well-nourished. No distress.  HENT:  Head: Normocephalic and atraumatic.  Right Ear: External ear normal.  Left Ear: External ear normal.  Nose: Nose normal.  Eyes: Conjunctivae and EOM are normal. Right eye exhibits no discharge. Left eye exhibits no discharge. No scleral icterus.  Neck: Normal range of motion. Neck supple.  Cardiovascular: Normal rate, regular rhythm and intact distal pulses.   Pulmonary/Chest: Effort normal and breath sounds normal. No respiratory distress.  Musculoskeletal: Normal range of motion. He exhibits no edema or tenderness.  Neurological: He is alert and oriented to person, place, and time. He has normal strength. No sensory deficit.  Skin: Skin is warm and dry. He is not diaphoretic.  Macerated skin to dorsal aspect of left thumb. No nailbed  involvement. Full ROM. Cap refill <3 seconds. Sensation to light touch intact. No erythema or drainage to suggest infection.  Psychiatric: He has a normal mood and affect. His behavior is normal.  Nursing note and vitals reviewed.   ED Course  Procedures (including critical care time)  Labs Review Labs Reviewed - No data to display  Imaging Review Dg Finger Thumb Left  02/01/2016  CLINICAL DATA:  Chain saw injury EXAM: LEFT THUMB 2+V COMPARISON:  None. FINDINGS: Frontal, oblique, and lateral views were obtained. There is soft tissue injury along the dorsal aspect of the distal aspect of the first digit. There is a comminuted fracture along the dorsal aspect of the proximal and mid portion of the first distal phalanx with several small displaced fracture fragments. No other fractures. No dislocation. 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 There is also osteoarthritic change in the second MCP joint. IMPRESSION: Comminuted fracture along the dorsal aspect of the proximal to mid aspect of the first distal phalanx with overlying soft tissue injury. No other fractures. No dislocation. 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 Electronically Signed   By: Lowella Grip III M.D.   On: 02/01/2016 20:13   I have personally reviewed and evaluated these images as part of my medical decision-making.   EKG Interpretation None      MDM   Final diagnoses:  Thumb laceration, left, subsequent encounter    65 year old male presents with persistent left thumb pain. He was evaluated in the ED yesterday after injuring himself while using a chainsaw. He was noted to have a comminuted fracture along the dorsal aspect of the proximal to mid aspect of the first distal phalanx, so hand surgery was consulted and the patient was started on antibiotics and instructed to follow-up, which he states he attempted to do but was told he needs a referral from his PCP.  Patient is afebrile. Vital signs stable. Dressing removed. On exam, patient has macerated skin to the dorsal aspect of his left thumb. No nailbed involvement. Full ROM. Cap refill <3 seconds. Sensation to light touch intact. No erythema or drainage to suggest infection. Patient discussed with Dr. Laneta Simmers. Will re-apply vaseline gauze, bandage, and splint. Advised patient to continue antibiotic and to pick up pain medication (patient states he did not fill pain medication because he does not like taking medication). Recommended patient ice and elevate. Will also give ibuprofen. Patient has follow-up with his PCP scheduled for Monday, at which time he will obtain a referral to hand surgery. Strict return precautions discussed. Patient verbalizes his understanding and is in agreement with plan.  BP 160/70 mmHg  Pulse 61  Temp(Src) 98.7 F (37.1 C) (Oral)  Resp 17  SpO2 100%  Marella Chimes, PA-C 02/02/16 1155  Leo Grosser, MD 02/02/16 2038

## 2016-02-02 NOTE — Discharge Instructions (Signed)
1. Medications: antibiotic, pain medication, ibuprofen, usual home medications 2. Treatment: rest, drink plenty of fluids, ice, elevate, wear splint 3. Follow Up: please followup with your primary doctor and with the hand specialist as scheduled for discussion of your diagnoses and further evaluation after today's visit; please return to the ER for high fever, increased pain or swelling, numbness, new or worsening symptoms

## 2016-02-02 NOTE — ED Notes (Signed)
Patient here from home stating that he was here last night for a thumb laceration. Here for follow up today. Reports filling medications last night.

## 2016-02-02 NOTE — Progress Notes (Signed)
pcp is [facility] EPIC updated Entered in d/c instructions  Burtis Junes, NP Go on 02/06/2016 You have a scheduled appointment for a 1 month followup [facility]. [address] [place] [address] [phone]

## 2016-02-06 ENCOUNTER — Ambulatory Visit (INDEPENDENT_AMBULATORY_CARE_PROVIDER_SITE_OTHER): Payer: Medicaid Other | Admitting: Physician Assistant

## 2016-02-06 ENCOUNTER — Encounter: Payer: Self-pay | Admitting: Physician Assistant

## 2016-02-06 VITALS — BP 120/70 | HR 64 | Ht 66.5 in | Wt 185.0 lb

## 2016-02-06 DIAGNOSIS — I10 Essential (primary) hypertension: Secondary | ICD-10-CM | POA: Diagnosis not present

## 2016-02-06 NOTE — Progress Notes (Signed)
Cardiology Office Note    Date:  02/06/2016   ID:  Thomas Bowman, DOB 20-Sep-1951, MRN [id number]  PCP:  [facility]  Cardiologist:  Dr. Aundra Dubin  Chief Complaint:f/u BP  History of Present Illness:  Thomas Bowman is a 65 y.o. male for hypertension. He had chest pain in 2015 with negative enzymes and normal Cardiolite. CTA was negative for PE. He has had trouble with his blood pressure and has been seen in the hypertension clinic here several times. He did not tolerate hydralazine due to cramps. He saw Truitt Merle 11/29/15 blood pressure remained high. He was getting too much salt. She had an clonidine 0.1 mg once daily. He was in the ER 02/02/16 with a left thumb comminuted fracture that occurred while operating a chain saw.  Patient comes in today and his blood pressure is stable. He says he took his medications today. He says he does not always take his medications and skips many days because he forgets. He is not scheduled to see the hand surgeon until next week and is already taken the splint off. He says he is going to take the bandage off today because he can't do anything with his hand with a bandage.    Past Medical History  Diagnosis Date  . Bone cancer ([place]) 90s  . Hypertension     Past Surgical History  Procedure Laterality Date  . Facial nerve surgery Right   . Appendectomy      Current Medications: Outpatient Prescriptions Prior to Visit  Medication Sig Dispense Refill  . amLODipine (NORVASC) 10 MG tablet Take 1 tablet (10 mg total) by mouth daily. 90 tablet 3  . cephALEXin (KEFLEX) 500 MG capsule Take 1 capsule (500 mg total) by mouth 4 (four) times daily. 28 capsule 0  . cloNIDine (CATAPRES) 0.1 MG tablet Take 1 tablet (0.1 mg total) by mouth at bedtime. 30 tablet 11  . hydrochlorothiazide (HYDRODIURIL) 50 MG tablet Take 1 tablet (50 mg total) by mouth daily. 90 tablet 3  . ibuprofen (ADVIL,MOTRIN) 800 MG tablet Take 1 tablet (800 mg total) by  mouth 3 (three) times daily. 21 tablet 0  . lisinopril (PRINIVIL,ZESTRIL) 40 MG tablet Take 1 tablet (40 mg total) by mouth daily. 90 tablet 3  . potassium chloride (K-DUR) 10 MEQ tablet Take 1 tablet (10 mEq total) by mouth daily. 90 tablet 3  . spironolactone (ALDACTONE) 25 MG tablet Take 1 tablet (25 mg total) by mouth daily. 30 tablet 3  . aspirin EC 81 MG EC tablet Take 1 tablet (81 mg total) by mouth daily. (Patient not taking: Reported on 02/06/2016) 30 tablet 1  . HYDROcodone-acetaminophen (NORCO/VICODIN) 5-325 MG tablet Take 1-2 tablets by mouth every 4 (four) hours as needed. (Patient not taking: Reported on 02/06/2016) 15 tablet 0  . Magnesium Oxide 200 MG TABS 1 tablet daily (Patient not taking: Reported on 02/06/2016) 90 tablet 3   No facility-administered medications prior to visit.     Allergies:   Hydralazine hcl   Social History   Social History  . Marital Status: Married    Spouse Name: N/A  . Number of Children: N/A  . Years of Education: N/A   Social History Main Topics  . Smoking status: Never Smoker   . Smokeless tobacco: None  . Alcohol Use: No  . Drug Use: No  . Sexual Activity: Not Asked   Other Topics Concern  . None   Social History Narrative  Family History:  The patient's    family history includes Cancer in his father and mother; Hypertension in his brother and sister. There is no history of Heart attack.   ROS:   Please see the history of present illness.    Review of Systems  Cardiovascular: Negative.   Respiratory: Negative.   Musculoskeletal: Positive for joint pain.  Gastrointestinal: Negative.    All other systems reviewed and are negative.   PHYSICAL EXAM:   VS:  BP 120/70 mmHg  Pulse 64  Ht 5' 6.5" (1.689 m)  Wt 185 lb (83.915 kg)  BMI 29.42 kg/m2   GEN: Well nourished, well developed, in no acute distress Neck: no JVD, carotid bruits, or masses Cardiac:  RRR; S4,no murmurs, rubs, or gallops,no edema  Respiratory:  clear to  auscultation bilaterally, normal work of breathing GI: soft, nontender, nondistended, + BS MS: no deformity or atrophy Skin: warm and dry, no rash Neuro:  Alert and Oriented x 3, Strength and sensation are intact Psych: euthymic mood, full affect  Wt Readings from Last 3 Encounters:  02/06/16 185 lb (83.915 kg)  11/29/15 184 lb 6.4 oz (83.643 kg)  07/27/15 182 lb (82.555 kg)      Studies/Labs Reviewed:   EKG:  EKG is not ordered today.   Recent Labs: 11/29/2015: ALT 19; BUN 17; Creat 1.12; Potassium 3.8; Sodium 135   Lipid Panel    Component Value Date/Time   CHOL 181 11/29/2015 1425   TRIG 135 11/29/2015 1425   HDL 47 11/29/2015 1425   CHOLHDL 3.9 11/29/2015 1425   VLDL 27 11/29/2015 1425   LDLCALC 107 11/29/2015 1425    Additional studies/ records that were reviewed today include:   MYOCARDIAL IMAGING WITH SPECT (REST AND EXERCISE) from 04/2014   FINDINGS: Myocardial uptake is within normal limits. There are no fixed reversible defects.   Gated imaging demonstrates normal contractility and wall thickening.   The estimated diastolic volume on gated imaging is 162 mL. The estimated systolic volume is 65 mL. The calculated ejection fraction is 64%.   IMPRESSION: 1. No evidence for inducible ischemia. 2. Normal contractility. 3. Normal ejection fraction of 64%.     Electronically Signed   By: Lawrence Santiago M.D.   On: 05/17/2014 16:14   SLEEP STUDY IMPRESSION/ RECOMMENDATION:    1.  No significant sleep disordered breathing noted. 2.  No evidence of periodic limb movement disorder or REM sleep disorders. 3.  No significant O2 desaturations during sleep.   4.  Good Sleep Efficiency   5.  The patient did have mild snoring so referral to ENT for evaluation of surgical causes of snoring could be considered.    6.  The patient should be counseled in good sleep hygiene.   [place], American Board of Sleep Medicine      ASSESSMENT:    1.  Essential hypertension      PLAN:   [id number] pressure is stable today. He took his medications. He is often noncompliant. Urged him to take his medications on a regular basis. 2 g sodium diet.  Left thumb injury. Urged him to see hand surgeon and resume wearing his splint   Medication Adjustments/Labs and Tests Ordered: Current medicines are reviewed at length with the patient today.  Concerns regarding medicines are outlined above.  Medication changes, Labs and Tests ordered today are listed in the Patient Instructions below. Patient Instructions  Ermalinda Barrios, PA-C, recommends that you continue on your current medications as  directed. Please refer to the Current Medication list given to you today.  Selinda Eon recommends that you schedule a follow-up appointment in 3-4 months with Dr Aundra Dubin.  Low-Sodium Eating Plan Sodium raises blood pressure and causes water to be held in the body. Getting less sodium from food will help lower your blood pressure, reduce any swelling, and protect your heart, liver, and kidneys. We get sodium by adding salt (sodium chloride) to food. Most of our sodium comes from canned, boxed, and frozen foods. Restaurant foods, fast foods, and pizza are also very high in sodium. Even if you take medicine to lower your blood pressure or to reduce fluid in your body, getting less sodium from your food is important. WHAT IS MY PLAN? Most people should limit their sodium intake to 2,300 mg a day. Your health care provider recommends that you limit your sodium intake to __________ a day.  WHAT DO I NEED TO KNOW ABOUT THIS EATING PLAN? For the low-sodium eating plan, you will follow these general guidelines:  Choose foods with a % Daily Value for sodium of less than 5% (as listed on the food label).   Use salt-free seasonings or herbs instead of table salt or sea salt.   Check with your health care provider or pharmacist before using salt substitutes.   Eat fresh  foods.  Eat more vegetables and fruits.  Limit canned vegetables. If you do use them, rinse them well to decrease the sodium.   Limit cheese to 1 oz (28 g) per day.   Eat lower-sodium products, often labeled as "lower sodium" or "no salt added."  Avoid foods that contain monosodium glutamate (MSG). MSG is sometimes added to [place] food and some canned foods.  Check food labels (Nutrition Facts labels) on foods to learn how much sodium is in one serving.  Eat more home-cooked food and less restaurant, buffet, and fast food.  When eating at a restaurant, ask that your food be prepared with less salt, or no salt if possible.  HOW DO I READ FOOD LABELS FOR SODIUM INFORMATION? The Nutrition Facts label lists the amount of sodium in one serving of the food. If you eat more than one serving, you must multiply the listed amount of sodium by the number of servings. Food labels may also identify foods as:  Sodium free--Less than 5 mg in a serving.  Very low sodium--35 mg or less in a serving.  Low sodium--140 mg or less in a serving.  Light in sodium--50% less sodium in a serving. For example, if a food that usually has 300 mg of sodium is changed to become light in sodium, it will have 150 mg of sodium.  Reduced sodium--25% less sodium in a serving. For example, if a food that usually has 400 mg of sodium is changed to reduced sodium, it will have 300 mg of sodium. WHAT FOODS CAN I EAT? Grains Low-sodium cereals, including oats, puffed wheat and rice, and shredded wheat cereals. Low-sodium crackers. Unsalted rice and pasta. Lower-sodium bread.  Vegetables Frozen or fresh vegetables. Low-sodium or reduced-sodium canned vegetables. Low-sodium or reduced-sodium tomato sauce and paste. Low-sodium or reduced-sodium tomato and vegetable juices.  Fruits Fresh, frozen, and canned fruit. Fruit juice.  Meat and Other Protein Products Low-sodium canned tuna and salmon. Fresh or frozen  meat, poultry, seafood, and fish. Lamb. Unsalted nuts. Dried beans, peas, and lentils without added salt. Unsalted canned beans. Homemade soups without salt. Eggs.  Dairy Milk. Soy milk. Ricotta cheese. Low-sodium  or reduced-sodium cheeses. Yogurt.  Condiments Fresh and dried herbs and spices. Salt-free seasonings. Onion and garlic powders. Low-sodium varieties of mustard and ketchup. Fresh or refrigerated horseradish. Lemon juice.  Fats and Oils Reduced-sodium salad dressings. Unsalted butter.  Other Unsalted popcorn and pretzels.  The items listed above may not be a complete list of recommended foods or beverages. Contact your dietitian for more options. WHAT FOODS ARE NOT RECOMMENDED? Grains Instant hot cereals. Bread stuffing, pancake, and biscuit mixes. Croutons. Seasoned rice or pasta mixes. Noodle soup cups. Boxed or frozen macaroni and cheese. Self-rising flour. Regular salted crackers. Vegetables Regular canned vegetables. Regular canned tomato sauce and paste. Regular tomato and vegetable juices. Frozen vegetables in sauces. Salted [place] fries. Olives. Angie Fava. Relishes. Sauerkraut. Salsa. Meat and Other Protein Products Salted, canned, smoked, spiced, or pickled meats, seafood, or fish. Bacon, ham, sausage, hot dogs, corned beef, chipped beef, and packaged luncheon meats. Salt pork. Jerky. Pickled herring. Anchovies, regular canned tuna, and sardines. Salted nuts. Dairy Processed cheese and cheese spreads. Cheese curds. Blue cheese and cottage cheese. Buttermilk.  Condiments Onion and garlic salt, seasoned salt, table salt, and sea salt. Canned and packaged gravies. Worcestershire sauce. Tartar sauce. Barbecue sauce. Teriyaki sauce. Soy sauce, including reduced sodium. Steak sauce. Fish sauce. Oyster sauce. Cocktail sauce. Horseradish that you find on the shelf. Regular ketchup and mustard. Meat flavorings and tenderizers. Bouillon cubes. Hot sauce. Tabasco sauce.  Marinades. Taco seasonings. Relishes. Fats and Oils Regular salad dressings. Salted butter. Margarine. Ghee. Bacon fat.  Other Potato and tortilla chips. Corn chips and puffs. Salted popcorn and pretzels. Canned or dried soups. Pizza. Frozen entrees and pot pies.  The items listed above may not be a complete list of foods and beverages to avoid. Contact your dietitian for more information.   This information is not intended to replace advice given to you by your health care provider. Make sure you discuss any questions you have with your health care provider.   Document Released: 03/30/2002 Document Revised: 10/29/2014 Document Reviewed: 08/12/2013 Elsevier Interactive Patient Education 2016 [place], Ermalinda Barrios, [place]  02/06/2016 11:47 AM    [facility] [place], [place], [place]  [address] Phone: [phone]; Fax: [phone]

## 2016-02-06 NOTE — Patient Instructions (Signed)
Thomas Barrios, PA-C, recommends that you continue on your current medications as directed. Please refer to the Current Medication list given to you today.  Thomas Bowman recommends that you schedule a follow-up appointment in 3-4 months with Dr Aundra Dubin.  Low-Sodium Eating Plan Sodium raises blood pressure and causes water to be held in the body. Getting less sodium from food will help lower your blood pressure, reduce any swelling, and protect your heart, liver, and kidneys. We get sodium by adding salt (sodium chloride) to food. Most of our sodium comes from canned, boxed, and frozen foods. Restaurant foods, fast foods, and pizza are also very high in sodium. Even if you take medicine to lower your blood pressure or to reduce fluid in your body, getting less sodium from your food is important. WHAT IS MY PLAN? Most people should limit their sodium intake to 2,300 mg a day. Your health care provider recommends that you limit your sodium intake to __________ a day.  WHAT DO I NEED TO KNOW ABOUT THIS EATING PLAN? For the low-sodium eating plan, you will follow these general guidelines:  Choose foods with a % Daily Value for sodium of less than 5% (as listed on the food label).   Use salt-free seasonings or herbs instead of table salt or sea salt.   Check with your health care provider or pharmacist before using salt substitutes.   Eat fresh foods.  Eat more vegetables and fruits.  Limit canned vegetables. If you do use them, rinse them well to decrease the sodium.   Limit cheese to 1 oz (28 g) per day.   Eat lower-sodium products, often labeled as "lower sodium" or "no salt added."  Avoid foods that contain monosodium glutamate (MSG). MSG is sometimes added to [place] food and some canned foods.  Check food labels (Nutrition Facts labels) on foods to learn how much sodium is in one serving.  Eat more home-cooked food and less restaurant, buffet, and fast food.  When eating at a  restaurant, ask that your food be prepared with less salt, or no salt if possible.  HOW DO I READ FOOD LABELS FOR SODIUM INFORMATION? The Nutrition Facts label lists the amount of sodium in one serving of the food. If you eat more than one serving, you must multiply the listed amount of sodium by the number of servings. Food labels may also identify foods as:  Sodium free--Less than 5 mg in a serving.  Very low sodium--35 mg or less in a serving.  Low sodium--140 mg or less in a serving.  Light in sodium--50% less sodium in a serving. For example, if a food that usually has 300 mg of sodium is changed to become light in sodium, it will have 150 mg of sodium.  Reduced sodium--25% less sodium in a serving. For example, if a food that usually has 400 mg of sodium is changed to reduced sodium, it will have 300 mg of sodium. WHAT FOODS CAN I EAT? Grains Low-sodium cereals, including oats, puffed wheat and rice, and shredded wheat cereals. Low-sodium crackers. Unsalted rice and pasta. Lower-sodium bread.  Vegetables Frozen or fresh vegetables. Low-sodium or reduced-sodium canned vegetables. Low-sodium or reduced-sodium tomato sauce and paste. Low-sodium or reduced-sodium tomato and vegetable juices.  Fruits Fresh, frozen, and canned fruit. Fruit juice.  Meat and Other Protein Products Low-sodium canned tuna and salmon. Fresh or frozen meat, poultry, seafood, and fish. Lamb. Unsalted nuts. Dried beans, peas, and lentils without added salt. Unsalted canned beans. Homemade soups  without salt. Eggs.  Dairy Milk. Soy milk. Ricotta cheese. Low-sodium or reduced-sodium cheeses. Yogurt.  Condiments Fresh and dried herbs and spices. Salt-free seasonings. Onion and garlic powders. Low-sodium varieties of mustard and ketchup. Fresh or refrigerated horseradish. Lemon juice.  Fats and Oils Reduced-sodium salad dressings. Unsalted butter.  Other Unsalted popcorn and pretzels.  The items listed  above may not be a complete list of recommended foods or beverages. Contact your dietitian for more options. WHAT FOODS ARE NOT RECOMMENDED? Grains Instant hot cereals. Bread stuffing, pancake, and biscuit mixes. Croutons. Seasoned rice or pasta mixes. Noodle soup cups. Boxed or frozen macaroni and cheese. Self-rising flour. Regular salted crackers. Vegetables Regular canned vegetables. Regular canned tomato sauce and paste. Regular tomato and vegetable juices. Frozen vegetables in sauces. Salted [place] fries. Olives. Angie Fava. Relishes. Sauerkraut. Salsa. Meat and Other Protein Products Salted, canned, smoked, spiced, or pickled meats, seafood, or fish. Bacon, ham, sausage, hot dogs, corned beef, chipped beef, and packaged luncheon meats. Salt pork. Jerky. Pickled herring. Anchovies, regular canned tuna, and sardines. Salted nuts. Dairy Processed cheese and cheese spreads. Cheese curds. Blue cheese and cottage cheese. Buttermilk.  Condiments Onion and garlic salt, seasoned salt, table salt, and sea salt. Canned and packaged gravies. Worcestershire sauce. Tartar sauce. Barbecue sauce. Teriyaki sauce. Soy sauce, including reduced sodium. Steak sauce. Fish sauce. Oyster sauce. Cocktail sauce. Horseradish that you find on the shelf. Regular ketchup and mustard. Meat flavorings and tenderizers. Bouillon cubes. Hot sauce. Tabasco sauce. Marinades. Taco seasonings. Relishes. Fats and Oils Regular salad dressings. Salted butter. Margarine. Ghee. Bacon fat.  Other Potato and tortilla chips. Corn chips and puffs. Salted popcorn and pretzels. Canned or dried soups. Pizza. Frozen entrees and pot pies.  The items listed above may not be a complete list of foods and beverages to avoid. Contact your dietitian for more information.   This information is not intended to replace advice given to you by your health care provider. Make sure you discuss any questions you have with your health care provider.     Document Released: 03/30/2002 Document Revised: 10/29/2014 Document Reviewed: 08/12/2013 Elsevier Interactive Patient Education [employer].

## 2016-04-13 ENCOUNTER — Encounter: Payer: Self-pay | Admitting: Cardiovascular Disease

## 2016-05-14 ENCOUNTER — Ambulatory Visit: Payer: Medicaid Other | Admitting: Cardiovascular Disease

## 2016-05-16 ENCOUNTER — Ambulatory Visit: Payer: Medicaid Other | Admitting: Cardiovascular Disease

## 2016-05-29 ENCOUNTER — Encounter: Payer: Self-pay | Admitting: Physician Assistant

## 2016-05-29 ENCOUNTER — Ambulatory Visit (INDEPENDENT_AMBULATORY_CARE_PROVIDER_SITE_OTHER): Payer: Medicaid Other | Admitting: Physician Assistant

## 2016-05-29 DIAGNOSIS — I10 Essential (primary) hypertension: Secondary | ICD-10-CM

## 2016-05-29 MED ORDER — CLONIDINE HCL 0.1 MG PO TABS: 0.1000 mg | ORAL_TABLET | Freq: Two times a day (BID) | ORAL | 3 refills | Status: DC

## 2016-05-29 MED ORDER — SPIRONOLACTONE 25 MG PO TABS: 25.0000 mg | ORAL_TABLET | Freq: Every day | ORAL | 3 refills | Status: DC

## 2016-05-29 MED ORDER — AMLODIPINE BESYLATE 10 MG PO TABS: 10.0000 mg | ORAL_TABLET | Freq: Every day | ORAL | 3 refills | Status: DC

## 2016-05-29 MED ORDER — LISINOPRIL 40 MG PO TABS: 40.0000 mg | ORAL_TABLET | Freq: Every day | ORAL | 3 refills | Status: DC

## 2016-05-29 NOTE — Patient Instructions (Addendum)
Medication Instructions:  Your physician has recommended you make the following change in your medication:  1.  INCREASE the Clonidine 0.1 to 1 tablet twice a day    Labwork: None ordered  Testing/Procedures: None ordered  Follow-Up: Your physician recommends that you schedule a follow-up appointment in: [place], PA-C  Any Other Special Instructions Will Be Listed Below (If Applicable).   Low-Sodium Eating Plan Sodium raises blood pressure and causes water to be held in the body. Getting less sodium from food will help lower your blood pressure, reduce any swelling, and protect your heart, liver, and kidneys. We get sodium by adding salt (sodium chloride) to food. Most of our sodium comes from canned, boxed, and frozen foods. Restaurant foods, fast foods, and pizza are also very high in sodium. Even if you take medicine to lower your blood pressure or to reduce fluid in your body, getting less sodium from your food is important. WHAT IS MY PLAN? Most people should limit their sodium intake to 2,300 mg a day. Your health care provider recommends that you limit your sodium intake to 2 GRAMS a day.  WHAT DO I NEED TO KNOW ABOUT THIS EATING PLAN? For the low-sodium eating plan, you will follow these general guidelines:  Choose foods with a % Daily Value for sodium of less than 5% (as listed on the food label).   Use salt-free seasonings or herbs instead of table salt or sea salt.   Check with your health care provider or pharmacist before using salt substitutes.   Eat fresh foods.  Eat more vegetables and fruits.  Limit canned vegetables. If you do use them, rinse them well to decrease the sodium.   Limit cheese to 1 oz (28 g) per day.   Eat lower-sodium products, often labeled as "lower sodium" or "no salt added."  Avoid foods that contain monosodium glutamate (MSG). MSG is sometimes added to [place] food and some canned foods.  Check food labels  (Nutrition Facts labels) on foods to learn how much sodium is in one serving.  Eat more home-cooked food and less restaurant, buffet, and fast food.  When eating at a restaurant, ask that your food be prepared with less salt, or no salt if possible.  HOW DO I READ FOOD LABELS FOR SODIUM INFORMATION? The Nutrition Facts label lists the amount of sodium in one serving of the food. If you eat more than one serving, you must multiply the listed amount of sodium by the number of servings. Food labels may also identify foods as:  Sodium free--Less than 5 mg in a serving.  Very low sodium--35 mg or less in a serving.  Low sodium--140 mg or less in a serving.  Light in sodium--50% less sodium in a serving. For example, if a food that usually has 300 mg of sodium is changed to become light in sodium, it will have 150 mg of sodium.  Reduced sodium--25% less sodium in a serving. For example, if a food that usually has 400 mg of sodium is changed to reduced sodium, it will have 300 mg of sodium. WHAT FOODS CAN I EAT? Grains Low-sodium cereals, including oats, puffed wheat and rice, and shredded wheat cereals. Low-sodium crackers. Unsalted rice and pasta. Lower-sodium bread.  Vegetables Frozen or fresh vegetables. Low-sodium or reduced-sodium canned vegetables. Low-sodium or reduced-sodium tomato sauce and paste. Low-sodium or reduced-sodium tomato and vegetable juices.  Fruits Fresh, frozen, and canned fruit. Fruit juice.  Meat and Other Protein  Products Low-sodium canned tuna and salmon. Fresh or frozen meat, poultry, seafood, and fish. Lamb. Unsalted nuts. Dried beans, peas, and lentils without added salt. Unsalted canned beans. Homemade soups without salt. Eggs.  Dairy Milk. Soy milk. Ricotta cheese. Low-sodium or reduced-sodium cheeses. Yogurt.  Condiments Fresh and dried herbs and spices. Salt-free seasonings. Onion and garlic powders. Low-sodium varieties of mustard and ketchup. Fresh  or refrigerated horseradish. Lemon juice.  Fats and Oils Reduced-sodium salad dressings. Unsalted butter.  Other Unsalted popcorn and pretzels.  The items listed above may not be a complete list of recommended foods or beverages. Contact your dietitian for more options. WHAT FOODS ARE NOT RECOMMENDED? Grains Instant hot cereals. Bread stuffing, pancake, and biscuit mixes. Croutons. Seasoned rice or pasta mixes. Noodle soup cups. Boxed or frozen macaroni and cheese. Self-rising flour. Regular salted crackers. Vegetables Regular canned vegetables. Regular canned tomato sauce and paste. Regular tomato and vegetable juices. Frozen vegetables in sauces. Salted [place] fries. Olives. Angie Fava. Relishes. Sauerkraut. Salsa. Meat and Other Protein Products Salted, canned, smoked, spiced, or pickled meats, seafood, or fish. Bacon, ham, sausage, hot dogs, corned beef, chipped beef, and packaged luncheon meats. Salt pork. Jerky. Pickled herring. Anchovies, regular canned tuna, and sardines. Salted nuts. Dairy Processed cheese and cheese spreads. Cheese curds. Blue cheese and cottage cheese. Buttermilk.  Condiments Onion and garlic salt, seasoned salt, table salt, and sea salt. Canned and packaged gravies. Worcestershire sauce. Tartar sauce. Barbecue sauce. Teriyaki sauce. Soy sauce, including reduced sodium. Steak sauce. Fish sauce. Oyster sauce. Cocktail sauce. Horseradish that you find on the shelf. Regular ketchup and mustard. Meat flavorings and tenderizers. Bouillon cubes. Hot sauce. Tabasco sauce. Marinades. Taco seasonings. Relishes. Fats and Oils Regular salad dressings. Salted butter. Margarine. Ghee. Bacon fat.  Other Potato and tortilla chips. Corn chips and puffs. Salted popcorn and pretzels. Canned or dried soups. Pizza. Frozen entrees and pot pies.  The items listed above may not be a complete list of foods and beverages to avoid. Contact your dietitian for more information.    This information is not intended to replace advice given to you by your health care provider. Make sure you discuss any questions you have with your health care provider.   Document Released: 03/30/2002 Document Revised: 10/29/2014 Document Reviewed: 08/12/2013 Elsevier Interactive Patient Education [employer].    If you need a refill on your cardiac medications before your next appointment, please call your pharmacy.

## 2016-05-29 NOTE — Progress Notes (Signed)
Cardiology Office Note    Date:  05/29/2016   ID:  Thomas Bowman, DOB 05-25-1951, MRN [id number]  PCP:  [facility]  Cardiologist:   Chief Complaint  Patient presents with  . Hypertension    History of Present Illness:  Thomas Bowman is a 66 y.o. male here for follow-up of hypertension.He had chest pain in 2015 with negative enzymes and normal Cardiolite. CTA was negative for PE. He has had trouble with his blood pressure and has been seen in the hypertension clinic here several times. He did not tolerate hydralazine due to cramps. He was started on clonidine 0.1 mg daily back in February 2017. He was in the ER 01/2016 with a left thumb fracture while operating a chain saw. I saw him 02/06/16 an blood pressure was pretty stable.  Patient comes in today because his blood pressure has been running high. He ran out of his spironolactone about a month ago. He is under a lot of stress as well. He takes all his medications at night. He does not always follow a 2 g sodium diet.    Past Medical History:  Diagnosis Date  . Bone cancer ([place]) 90s  . Hypertension     Past Surgical History:  Procedure Laterality Date  . APPENDECTOMY    . FACIAL NERVE SURGERY Right     Current Medications: Outpatient Medications Prior to Visit  Medication Sig Dispense Refill  . aspirin 81 MG tablet Take 81 mg by mouth daily.    . cephALEXin (KEFLEX) 500 MG capsule Take 1 capsule (500 mg total) by mouth 4 (four) times daily. 28 capsule 0  . HYDROcodone-acetaminophen (NORCO/VICODIN) 5-325 MG tablet Take 1 tablet by mouth every 6 (six) hours as needed for moderate pain (PAIN).    Marland Kitchen ibuprofen (ADVIL,MOTRIN) 800 MG tablet Take 1 tablet (800 mg total) by mouth 3 (three) times daily. 21 tablet 0  . amLODipine (NORVASC) 10 MG tablet Take 1 tablet (10 mg total) by mouth daily. 90 tablet 3  . cloNIDine (CATAPRES) 0.1 MG tablet Take 1 tablet (0.1 mg total) by mouth at bedtime. 30 tablet 11  .  lisinopril (PRINIVIL,ZESTRIL) 40 MG tablet Take 1 tablet (40 mg total) by mouth daily. 90 tablet 3  . spironolactone (ALDACTONE) 25 MG tablet Take 1 tablet (25 mg total) by mouth daily. 30 tablet 3  . hydrochlorothiazide (HYDRODIURIL) 50 MG tablet Take 1 tablet (50 mg total) by mouth daily. 90 tablet 3  . Magnesium Oxide 200 MG TABS Take 1 tablet by mouth daily.    . potassium chloride (K-DUR) 10 MEQ tablet Take 1 tablet (10 mEq total) by mouth daily. 90 tablet 3   No facility-administered medications prior to visit.      Allergies:   Hydralazine hcl and Sulfa antibiotics   Social History   Social History  . Marital status: Married    Spouse name: N/A  . Number of children: N/A  . Years of education: N/A   Social History Main Topics  . Smoking status: Never Smoker  . Smokeless tobacco: None  . Alcohol use No  . Drug use: No  . Sexual activity: Not Asked   Other Topics Concern  . None   Social History Narrative  . None     Family History:  The patient's   family history includes Cancer in his father and mother; Hypertension in his brother and sister.   ROS:   Please see the history of  present illness.    Review of Systems  Constitution: Negative.  HENT: Negative.   Cardiovascular: Negative.   Respiratory: Negative.   Endocrine: Negative.   Hematologic/Lymphatic: Negative.   Musculoskeletal: Negative.   Gastrointestinal: Negative.   Genitourinary: Negative.   Neurological: Positive for tremors.       Complains of right hand tremor   All other systems reviewed and are negative.   PHYSICAL EXAM:   VS:  BP (!) 200/115   Pulse (!) 59   Ht 5' 6.5" (1.689 m)   Wt 187 lb 1.9 oz (84.9 kg)   BMI 29.75 kg/m   Physical Exam  GEN: Well nourished, well developed, in no acute distress  HEENT: normal  Neck: no JVD, carotid bruits, or masses Cardiac:RRR; no murmurs, rubs, or gallops  Respiratory:  clear to auscultation bilaterally, normal work of breathing GI: soft,  nontender, nondistended, + BS Ext: without cyanosis, clubbing, or edema, Good distal pulses bilaterally MS: no deformity or atrophy  Skin: warm and dry, no rash Neuro:  Alert and Oriented x 3, Strength and sensation are intact Psych: euthymic mood, full affect  Wt Readings from Last 3 Encounters:  05/29/16 187 lb 1.9 oz (84.9 kg)  02/06/16 185 lb (83.9 kg)  11/29/15 184 lb 6.4 oz (83.6 kg)      Studies/Labs Reviewed:   EKG:  EKG is ordered today.  The ekg ordered today demonstrates Sinus bradycardia otherwise normal Recent Labs: 11/29/2015: ALT 19; BUN 17; Creat 1.12; Potassium 3.8; Sodium 135   Lipid Panel    Component Value Date/Time   CHOL 181 11/29/2015 1425   TRIG 135 11/29/2015 1425   HDL 47 11/29/2015 1425   CHOLHDL 3.9 11/29/2015 1425   VLDL 27 11/29/2015 1425   LDLCALC 107 11/29/2015 1425    Additional studies/ records that were reviewed today include:  MYOCARDIAL IMAGING WITH SPECT (REST AND EXERCISE) from 04/2014   FINDINGS: Myocardial uptake is within normal limits. There are no fixed reversible defects.   Gated imaging demonstrates normal contractility and wall thickening.   The estimated diastolic volume on gated imaging is 162 mL. The estimated systolic volume is 65 mL. The calculated ejection fraction is 64%.   IMPRESSION: 1. No evidence for inducible ischemia. 2. Normal contractility. 3. Normal ejection fraction of 64%.     Electronically Signed   By: Lawrence Santiago M.D.   On: 05/17/2014 16:14     SLEEP STUDY IMPRESSION/ RECOMMENDATION:    1.  No significant sleep disordered breathing noted. 2.  No evidence of periodic limb movement disorder or REM sleep disorders. 3.  No significant O2 desaturations during sleep.   4.  Good Sleep Efficiency   5.  The patient did have mild snoring so referral to ENT for evaluation of surgical causes of snoring could be considered.    6.  The patient should be counseled in good sleep hygiene.    [place], American Board of Sleep Medicine       ASSESSMENT:    1. Essential hypertension      PLAN:  In order of problems listed above:  Patient's blood pressure is very high today. He ran out of his spironolactone a month ago and I'm not sure what medications he is taking. We'll refill all his medications. Increase clonidine to 0.1 mg twice a day. 2 g sodium diet. I'll see him back in 2 weeks. He is advised to bring all his medications with him.    Medication  Adjustments/Labs and Tests Ordered: Current medicines are reviewed at length with the patient today.  Concerns regarding medicines are outlined above.  Medication changes, Labs and Tests ordered today are listed in the Patient Instructions below. Patient Instructions  Medication Instructions:  Your physician has recommended you make the following change in your medication:  1.  INCREASE the Clonidine 0.1 to 1 tablet twice a day    Labwork: None ordered  Testing/Procedures: None ordered  Follow-Up: Your physician recommends that you schedule a follow-up appointment in: [place], PA-C  Any Other Special Instructions Will Be Listed Below (If Applicable).   Low-Sodium Eating Plan Sodium raises blood pressure and causes water to be held in the body. Getting less sodium from food will help lower your blood pressure, reduce any swelling, and protect your heart, liver, and kidneys. We get sodium by adding salt (sodium chloride) to food. Most of our sodium comes from canned, boxed, and frozen foods. Restaurant foods, fast foods, and pizza are also very high in sodium. Even if you take medicine to lower your blood pressure or to reduce fluid in your body, getting less sodium from your food is important. WHAT IS MY PLAN? Most people should limit their sodium intake to 2,300 mg a day. Your health care provider recommends that you limit your sodium intake to 2 GRAMS a day.  WHAT DO I NEED TO KNOW ABOUT  THIS EATING PLAN? For the low-sodium eating plan, you will follow these general guidelines:  Choose foods with a % Daily Value for sodium of less than 5% (as listed on the food label).   Use salt-free seasonings or herbs instead of table salt or sea salt.   Check with your health care provider or pharmacist before using salt substitutes.   Eat fresh foods.  Eat more vegetables and fruits.  Limit canned vegetables. If you do use them, rinse them well to decrease the sodium.   Limit cheese to 1 oz (28 g) per day.   Eat lower-sodium products, often labeled as "lower sodium" or "no salt added."  Avoid foods that contain monosodium glutamate (MSG). MSG is sometimes added to [place] food and some canned foods.  Check food labels (Nutrition Facts labels) on foods to learn how much sodium is in one serving.  Eat more home-cooked food and less restaurant, buffet, and fast food.  When eating at a restaurant, ask that your food be prepared with less salt, or no salt if possible.  HOW DO I READ FOOD LABELS FOR SODIUM INFORMATION? The Nutrition Facts label lists the amount of sodium in one serving of the food. If you eat more than one serving, you must multiply the listed amount of sodium by the number of servings. Food labels may also identify foods as:  Sodium free--Less than 5 mg in a serving.  Very low sodium--35 mg or less in a serving.  Low sodium--140 mg or less in a serving.  Light in sodium--50% less sodium in a serving. For example, if a food that usually has 300 mg of sodium is changed to become light in sodium, it will have 150 mg of sodium.  Reduced sodium--25% less sodium in a serving. For example, if a food that usually has 400 mg of sodium is changed to reduced sodium, it will have 300 mg of sodium. WHAT FOODS CAN I EAT? Grains Low-sodium cereals, including oats, puffed wheat and rice, and shredded wheat cereals. Low-sodium crackers. Unsalted rice and pasta.  Lower-sodium  bread.  Vegetables Frozen or fresh vegetables. Low-sodium or reduced-sodium canned vegetables. Low-sodium or reduced-sodium tomato sauce and paste. Low-sodium or reduced-sodium tomato and vegetable juices.  Fruits Fresh, frozen, and canned fruit. Fruit juice.  Meat and Other Protein Products Low-sodium canned tuna and salmon. Fresh or frozen meat, poultry, seafood, and fish. Lamb. Unsalted nuts. Dried beans, peas, and lentils without added salt. Unsalted canned beans. Homemade soups without salt. Eggs.  Dairy Milk. Soy milk. Ricotta cheese. Low-sodium or reduced-sodium cheeses. Yogurt.  Condiments Fresh and dried herbs and spices. Salt-free seasonings. Onion and garlic powders. Low-sodium varieties of mustard and ketchup. Fresh or refrigerated horseradish. Lemon juice.  Fats and Oils Reduced-sodium salad dressings. Unsalted butter.  Other Unsalted popcorn and pretzels.  The items listed above may not be a complete list of recommended foods or beverages. Contact your dietitian for more options. WHAT FOODS ARE NOT RECOMMENDED? Grains Instant hot cereals. Bread stuffing, pancake, and biscuit mixes. Croutons. Seasoned rice or pasta mixes. Noodle soup cups. Boxed or frozen macaroni and cheese. Self-rising flour. Regular salted crackers. Vegetables Regular canned vegetables. Regular canned tomato sauce and paste. Regular tomato and vegetable juices. Frozen vegetables in sauces. Salted [place] fries. Olives. Angie Fava. Relishes. Sauerkraut. Salsa. Meat and Other Protein Products Salted, canned, smoked, spiced, or pickled meats, seafood, or fish. Bacon, ham, sausage, hot dogs, corned beef, chipped beef, and packaged luncheon meats. Salt pork. Jerky. Pickled herring. Anchovies, regular canned tuna, and sardines. Salted nuts. Dairy Processed cheese and cheese spreads. Cheese curds. Blue cheese and cottage cheese. Buttermilk.  Condiments Onion and garlic salt, seasoned salt,  table salt, and sea salt. Canned and packaged gravies. Worcestershire sauce. Tartar sauce. Barbecue sauce. Teriyaki sauce. Soy sauce, including reduced sodium. Steak sauce. Fish sauce. Oyster sauce. Cocktail sauce. Horseradish that you find on the shelf. Regular ketchup and mustard. Meat flavorings and tenderizers. Bouillon cubes. Hot sauce. Tabasco sauce. Marinades. Taco seasonings. Relishes. Fats and Oils Regular salad dressings. Salted butter. Margarine. Ghee. Bacon fat.  Other Potato and tortilla chips. Corn chips and puffs. Salted popcorn and pretzels. Canned or dried soups. Pizza. Frozen entrees and pot pies.  The items listed above may not be a complete list of foods and beverages to avoid. Contact your dietitian for more information.   This information is not intended to replace advice given to you by your health care provider. Make sure you discuss any questions you have with your health care provider.   Document Released: 03/30/2002 Document Revised: 10/29/2014 Document Reviewed: 08/12/2013 Elsevier Interactive Patient Education [employer].    If you need a refill on your cardiac medications before your next appointment, please call your pharmacy.      Sumner Boast, PA-C  05/29/2016 10:50 AM    [facility] [place], [place], [place]  [id number] Phone: [phone]; Fax: [phone]

## 2016-06-18 ENCOUNTER — Encounter: Payer: Self-pay | Admitting: Physician Assistant

## 2016-06-18 ENCOUNTER — Ambulatory Visit (INDEPENDENT_AMBULATORY_CARE_PROVIDER_SITE_OTHER): Payer: Medicaid Other | Admitting: Physician Assistant

## 2016-06-18 VITALS — BP 136/80 | HR 53 | Ht 67.0 in | Wt 187.4 lb

## 2016-06-18 DIAGNOSIS — I10 Essential (primary) hypertension: Secondary | ICD-10-CM | POA: Diagnosis not present

## 2016-06-18 DIAGNOSIS — Z79899 Other long term (current) drug therapy: Secondary | ICD-10-CM

## 2016-06-18 LAB — BASIC METABOLIC PANEL
BUN: 19 mg/dL (ref 7–25)
CALCIUM: 8.7 mg/dL (ref 8.6–10.3)
CHLORIDE: 107 mmol/L (ref 98–110)
CO2: 26 mmol/L (ref 20–31)
CREATININE: 1.2 mg/dL (ref 0.70–1.25)
GLUCOSE: 118 mg/dL — AB (ref 65–99)
Potassium: 4.4 mmol/L (ref 3.5–5.3)
Sodium: 138 mmol/L (ref 135–146)

## 2016-06-18 NOTE — Addendum Note (Signed)
Addended by: Eulis Foster on: 06/18/2016 10:44 AM   Modules accepted: Orders

## 2016-06-18 NOTE — Patient Instructions (Addendum)
Medication Instructions:  Continue current medications  Labwork: BMP Today  Testing/Procedures: None Ordered  Follow-Up: Your physician recommends that you schedule a follow-up appointment in: 4-6 Months Dr Aundra Dubin   Any Other Special Instructions Will Be Listed Below (If Applicable).   If you need a refill on your cardiac medications before your next appointment, please call your pharmacy.

## 2016-06-18 NOTE — Progress Notes (Signed)
Cardiology Office Note    Date:  06/18/2016   ID:  Thomas Bowman, DOB 1951-09-19, MRN [id number]  PCP:  [facility]  Cardiologist: Dr. Aundra Dubin  Chief Complaint  Patient presents with  . Follow-up    History of Present Illness:  Thomas Bowman is a 65 y.o. male here for follow-up of hypertension.He had chest pain in 2015 with negative enzymes and normal Cardiolite. CTA was negative for PE. He has had trouble with his blood pressure and has been seen in the hypertension clinic here several times. He did not tolerate hydralazine due to cramps. He was started on clonidine 0.1 mg daily back in February 2017. He was in the ER 01/2016 with a left thumb fracture while operating a chain saw. I saw him 02/06/16 an blood pressure was pretty stable.  I saw him  05/29/16 because his blood pressure has been running high. He ran out of his spironolactone about a month ago. He is under a lot of stress as well. He takes all his medications at night. He does not always follow a 2 g sodium diet.I refilled all his medications and increase his clonidine to 0.1 mg twice a day. He is back today for follow-up.  Patient comes in today feeling much better. He complains of a dry mouth but otherwise his blood pressure is much better. No other cardiac complaints.    Past Medical History:  Diagnosis Date  . Bone cancer ([place]) 90s  . Hypertension     Past Surgical History:  Procedure Laterality Date  . APPENDECTOMY    . FACIAL NERVE SURGERY Right     Current Medications: Outpatient Medications Prior to Visit  Medication Sig Dispense Refill  . amLODipine (NORVASC) 10 MG tablet Take 1 tablet (10 mg total) by mouth daily. 90 tablet 3  . aspirin 81 MG tablet Take 81 mg by mouth daily.    . cephALEXin (KEFLEX) 500 MG capsule Take 1 capsule (500 mg total) by mouth 4 (four) times daily. 28 capsule 0  . cloNIDine (CATAPRES) 0.1 MG tablet Take 1 tablet (0.1 mg total) by mouth 2 (two) times daily. 60  tablet 3  . HYDROcodone-acetaminophen (NORCO/VICODIN) 5-325 MG tablet Take 1 tablet by mouth every 6 (six) hours as needed for moderate pain (PAIN).    Marland Kitchen ibuprofen (ADVIL,MOTRIN) 800 MG tablet Take 1 tablet (800 mg total) by mouth 3 (three) times daily. 21 tablet 0  . lisinopril (PRINIVIL,ZESTRIL) 40 MG tablet Take 1 tablet (40 mg total) by mouth daily. 30 tablet 3  . spironolactone (ALDACTONE) 25 MG tablet Take 1 tablet (25 mg total) by mouth daily. 30 tablet 3   No facility-administered medications prior to visit.      Allergies:   Hydralazine hcl and Sulfa antibiotics   Social History   Social History  . Marital status: Married    Spouse name: N/A  . Number of children: N/A  . Years of education: N/A   Social History Main Topics  . Smoking status: Never Smoker  . Smokeless tobacco: Never Used  . Alcohol use No  . Drug use: No  . Sexual activity: Not Asked   Other Topics Concern  . None   Social History Narrative  . None     Family History:  The patient's   family history includes Cancer in his father and mother; Hypertension in his brother and sister.   ROS:   Please see the history of present illness.  Review of Systems  Constitution: Negative.  HENT: Negative.   Cardiovascular: Negative.   Respiratory: Negative.   Endocrine: Negative.   Hematologic/Lymphatic: Negative.   Musculoskeletal: Negative.   Gastrointestinal: Negative.   Genitourinary: Negative.   Neurological: Positive for tremors.       Complains of right hand tremor   All other systems reviewed and are negative.   PHYSICAL EXAM:   VS:  BP 136/80   Pulse (!) 53   Ht 5\' 7"  (1.702 m)   Wt 187 lb 6.4 oz (85 kg)   SpO2 98%   BMI 29.35 kg/m   Physical Exam  GEN: Well nourished, well developed, in no acute distress  Neck: no JVD, carotid bruits, or masses Cardiac:RRR; no murmurs, Positive S4, rubs   Respiratory:  clear to auscultation bilaterally, normal work of breathing GI: soft,  nontender, nondistended, + BS Ext: without cyanosis, clubbing, or edema, Good distal pulses bilaterally MS: no deformity or atrophy  Skin: warm and dry, no rash Psych: euthymic mood, full affect  Wt Readings from Last 3 Encounters:  06/18/16 187 lb 6.4 oz (85 kg)  05/29/16 187 lb 1.9 oz (84.9 kg)  02/06/16 185 lb (83.9 kg)      Studies/Labs Reviewed:   EKG:  EKG is Not ordered today.   Recent Labs: 11/29/2015: ALT 19; BUN 17; Creat 1.12; Potassium 3.8; Sodium 135   Lipid Panel    Component Value Date/Time   CHOL 181 11/29/2015 1425   TRIG 135 11/29/2015 1425   HDL 47 11/29/2015 1425   CHOLHDL 3.9 11/29/2015 1425   VLDL 27 11/29/2015 1425   LDLCALC 107 11/29/2015 1425    Additional studies/ records that were reviewed today include:  MYOCARDIAL IMAGING WITH SPECT (REST AND EXERCISE) from 04/2014   FINDINGS: Myocardial uptake is within normal limits. There are no fixed reversible defects.   Gated imaging demonstrates normal contractility and wall thickening.   The estimated diastolic volume on gated imaging is 162 mL. The estimated systolic volume is 65 mL. The calculated ejection fraction is 64%.   IMPRESSION: 1. No evidence for inducible ischemia. 2. Normal contractility. 3. Normal ejection fraction of 64%.     Electronically Signed   By: Lawrence Santiago M.D.   On: 05/17/2014 16:14     SLEEP STUDY IMPRESSION/ RECOMMENDATION:    1.  No significant sleep disordered breathing noted. 2.  No evidence of periodic limb movement disorder or REM sleep disorders. 3.  No significant O2 desaturations during sleep.   4.  Good Sleep Efficiency   5.  The patient did have mild snoring so referral to ENT for evaluation of surgical causes of snoring could be considered.    6.  The patient should be counseled in good sleep hygiene.    [place], American Board of Sleep Medicine       ASSESSMENT:    1. Medication management   2. Essential hypertension       PLAN:  In order of problems listed above:  Medication management and essential hypertension much better controlled on current medications. He does have some dry mouth but can control it with drinking water and/or sugar-free mints or gum. Check renal function today. Follow-up with Dr.McLean in 6 months    Medication Adjustments/Labs and Tests Ordered: Current medicines are reviewed at length with the patient today.  Concerns regarding medicines are outlined above.  Medication changes, Labs and Tests ordered today are listed in the Patient Instructions below. Patient Instructions  Medication Instructions:  Continue current medications  Labwork: BMP Today  Testing/Procedures: None Ordered  Follow-Up: Your physician recommends that you schedule a follow-up appointment in: 4-6 Months Dr Aundra Dubin   Any Other Special Instructions Will Be Listed Below (If Applicable).     If you need a refill on your cardiac medications before your next appointment, please call your pharmacy.      Sumner Boast, PA-C  06/18/2016 10:36 AM    [facility] [address], [place], [place]  [address] Phone: [phone]; Fax: [phone]

## 2016-08-20 ENCOUNTER — Other Ambulatory Visit: Payer: Self-pay | Admitting: Nurse Practitioner

## 2016-08-20 DIAGNOSIS — I10 Essential (primary) hypertension: Secondary | ICD-10-CM

## 2016-08-20 DIAGNOSIS — R252 Cramp and spasm: Secondary | ICD-10-CM

## 2016-08-20 DIAGNOSIS — G4719 Other hypersomnia: Secondary | ICD-10-CM

## 2016-08-22 NOTE — Telephone Encounter (Signed)
These medications were removed while you were rooming that patient on 05/29/16 with a reason of error. I do not see any mention that Estella Husk, PA was stopping these medications. Can you call the patient and see if he is taking them and if so please put them back on his med list? Thanks, MI

## 2016-08-27 ENCOUNTER — Other Ambulatory Visit: Payer: Self-pay

## 2016-08-27 MED ORDER — POTASSIUM CHLORIDE ER 10 MEQ PO TBCR: 10.0000 meq | EXTENDED_RELEASE_TABLET | Freq: Every day | ORAL | 2 refills | Status: DC

## 2016-08-27 MED ORDER — HYDROCHLOROTHIAZIDE 50 MG PO TABS: 50.0000 mg | ORAL_TABLET | Freq: Every day | ORAL | 2 refills | Status: DC

## 2016-08-27 MED ORDER — MAGNESIUM OXIDE -MG SUPPLEMENT 200 MG PO TABS: 1.0000 | ORAL_TABLET | Freq: Every day | ORAL | 2 refills | Status: DC

## 2016-08-27 NOTE — Telephone Encounter (Signed)
Received 3 refill requests via fax from [facility] for: - Magnesium Oxide 200 mg tab - 1 tab QD  - Hydrochlorothiazide 50 mg tab - 1 tab QD  - Potassium Chloride 10 mEq tab - 1 tab QD  All 3 medications were removed from pt's med list by the CMA (reason was "error") during OV with Ermalinda Barrios, PA-C on 05/29/16. I spoke with Selinda Eon today and she reviewed pt's record and said that the pt does not know his medications that well and maybe that is why they were removed. OK to refill all 3 per Ermalinda Barrios, PA-C.

## 2016-10-10 ENCOUNTER — Ambulatory Visit: Payer: Medicaid Other | Admitting: Cardiology

## 2016-10-10 ENCOUNTER — Other Ambulatory Visit: Payer: Self-pay | Admitting: *Deleted

## 2016-10-16 ENCOUNTER — Encounter: Payer: Self-pay | Admitting: Cardiology

## 2017-03-25 ENCOUNTER — Ambulatory Visit (INDEPENDENT_AMBULATORY_CARE_PROVIDER_SITE_OTHER): Payer: Medicare Other | Admitting: Podiatry

## 2017-03-25 ENCOUNTER — Ambulatory Visit (INDEPENDENT_AMBULATORY_CARE_PROVIDER_SITE_OTHER): Payer: Medicaid Other

## 2017-03-25 ENCOUNTER — Encounter: Payer: Self-pay | Admitting: Podiatry

## 2017-03-25 DIAGNOSIS — M722 Plantar fascial fibromatosis: Secondary | ICD-10-CM

## 2017-03-25 DIAGNOSIS — M79671 Pain in right foot: Secondary | ICD-10-CM

## 2017-03-25 DIAGNOSIS — M79672 Pain in left foot: Principal | ICD-10-CM

## 2017-03-25 MED ORDER — MELOXICAM 15 MG PO TABS: 15.0000 mg | ORAL_TABLET | Freq: Every day | ORAL | 1 refills | Status: AC

## 2017-03-25 NOTE — Progress Notes (Signed)
   Subjective: Patient presents today for burning pain and tenderness in the left arch and plantar heel onset about one month ago. Walking and being on his feet for extended periods of time increase the pain. There are no alleviating factors noted and pt has not attempted any treatments. Patient presents today for further treatment and evaluation  Objective: Physical Exam General: The patient is alert and oriented x3 in no acute distress.  Dermatology: Skin is warm, dry and supple bilateral lower extremities. Negative for open lesions or macerations bilateral.   Vascular: Dorsalis Pedis and Posterior Tibial pulses palpable bilateral.  Capillary fill time is immediate to all digits.  Neurological: Epicritic and protective threshold intact bilateral.   Musculoskeletal: Tenderness to palpation at the medial calcaneal tubercale and through the insertion of the plantar fascia of the left foot. All other joints range of motion within normal limits bilateral. Strength 5/5 in all groups bilateral.   Radiographic exam:   Normal osseous mineralization. Joint spaces preserved. No fracture/dislocation/boney destruction. Calcaneal spur present with mild thickening of plantar fascia left. No other soft tissue abnormalities or radiopaque foreign bodies.   Assessment: 1. Plantar fasciitis left foot 2. Pain in left foot  Plan of Care:   1. Patient evaluated. Xrays reviewed.   2. Injection of 0.5cc Celestone soluspan injected into the left plantar fascia.  3. Instructed patient regarding therapies and modalities at home to alleviate symptoms.  4. Rx for meloxicam 15mg  PO given to patient.  5. Plantar fascial band(s) dispensed. 6. Return to clinic in 4 weeks.   Edrick Kins, DPM [facility]  Dr. Edrick Kins, [address]                                        [place], [place] [address]                Office [phone]  Fax [phone]

## 2017-04-13 MED ORDER — BETAMETHASONE SOD PHOS & ACET 6 (3-3) MG/ML IJ SUSP: 3.0000 mg | Freq: Once | INTRAMUSCULAR | Status: DC

## 2017-04-22 ENCOUNTER — Ambulatory Visit: Payer: Medicare Other | Admitting: Podiatry

## 2017-05-06 ENCOUNTER — Ambulatory Visit (INDEPENDENT_AMBULATORY_CARE_PROVIDER_SITE_OTHER): Payer: Medicare Other | Admitting: Podiatry

## 2017-05-06 DIAGNOSIS — M722 Plantar fascial fibromatosis: Secondary | ICD-10-CM | POA: Diagnosis not present

## 2017-05-12 MED ORDER — BETAMETHASONE SOD PHOS & ACET 6 (3-3) MG/ML IJ SUSP: 3.0000 mg | Freq: Once | INTRAMUSCULAR | Status: DC

## 2017-05-12 NOTE — Progress Notes (Signed)
   Subjective: Patient presents today for follow-up treatment and evaluation of plantar fasciitis to the left foot. Patient states that he is doing better. Some days his foot feels good and other days it feels that he's on prolonged periods of time. He states that the plantar fascial brace helps.  Objective: Physical Exam General: The patient is alert and oriented x3 in no acute distress.  Dermatology: Skin is warm, dry and supple bilateral lower extremities. Negative for open lesions or macerations bilateral.   Vascular: Dorsalis Pedis and Posterior Tibial pulses palpable bilateral.  Capillary fill time is immediate to all digits.  Neurological: Epicritic and protective threshold intact bilateral.   Musculoskeletal: Tenderness to palpation at the medial calcaneal tubercale and through the insertion of the plantar fascia of the left foot. All other joints range of motion within normal limits bilateral. Strength 5/5 in all groups bilateral.   Assessment: 1. Plantar fasciitis left foot  Plan of Care:  1. Patient evaluated. Xrays reviewed.   2. Injection of 0.5cc Celestone soluspan injected into the left plantar fascia.  3. Continue wearing good shoes, plantar fascial brace, and meloxicam 4. Continue conservative modalities including stretching exercises. 5. Return to clinic in 4 weeks. If the patient is not better we will recommend physical therapy for shockwave treatment    Edrick Kins, DPM [facility]  Dr. Edrick Kins, DPM    [address], [place] [address]                Office [phone]  Fax [phone]

## 2017-05-13 ENCOUNTER — Ambulatory Visit: Payer: Medicare Other | Admitting: Podiatry

## 2017-07-31 ENCOUNTER — Encounter: Payer: Self-pay | Admitting: Podiatry

## 2017-07-31 ENCOUNTER — Ambulatory Visit (INDEPENDENT_AMBULATORY_CARE_PROVIDER_SITE_OTHER): Payer: Medicare Other | Admitting: Podiatry

## 2017-07-31 DIAGNOSIS — M722 Plantar fascial fibromatosis: Secondary | ICD-10-CM

## 2017-07-31 DIAGNOSIS — I7090 Unspecified atherosclerosis: Secondary | ICD-10-CM | POA: Diagnosis not present

## 2017-07-31 MED ORDER — TRIAMCINOLONE ACETONIDE 10 MG/ML IJ SUSP
10.0000 mg | Freq: Once | INTRAMUSCULAR | Status: AC
  Administered 2017-07-31: 10 mg

## 2017-07-31 MED ORDER — METHYLPREDNISOLONE 4 MG PO TBPK: ORAL_TABLET | ORAL | 0 refills | Status: DC

## 2017-07-31 NOTE — Progress Notes (Signed)
Subjective: Thomas Bowman presents to the office today for concerns of recurrent pain to the left heel. He states that after the first injection he did well and the pain remained proved. He had a second injection which did not help very much. He said the pain was intermittent but is become more consistent. He describes is sharp pain in the bottom of the heel. He denies any recent injury or trauma. The pain is becoming more consistent throughout the day as well. He has not been stretching, icing or taking any anti-inflammatory medicines. He has no other concerns today. Denies any systemic complaints such as fevers, chills, nausea, vomiting. No acute changes since last appointment, and no other complaints at this time.   Objective: AAO x3, NAD DP/PT pulses palpable bilaterally, CRT less than 3 seconds Tenderness to palpation along the plantar medial tubercle of the calcaneus at the insertion of plantar fascia on the left foot. There is no pain along the course of the plantar fascia within the arch of the foot. Plantar fascia appears to be intact. There is no pain with lateral compression of the calcaneus or pain with vibratory sensation. There is no pain along the course or insertion of the achilles tendon. No other areas of tenderness to bilateral lower extremities. Negative Tinel sign. Equinus present No open lesions or pre-ulcerative lesions.  No pain with calf compression, swelling, warmth, erythema  Assessment: Left heel pain likely plantar fasciitis  Plan: -All treatment options discussed with the patient including all alternatives, risks, complications.  -X-rays were reviewed the patient previously. Also he did have vessel calcification present and given the pain I did do ABI the office today which was normal. -Patient elects to proceed with steroid injection into the left heel. Under sterile skin preparation, a total of 2.5cc of kenalog 10, 0.5% Marcaine plain, and 2% lidocaine plain were infiltrated  into the symptomatic area without complication. A band-aid was applied. Patient tolerated the injection well without complication. Post-injection care with discussed with the patient. Discussed with the patient to ice the area over the next couple of days to help prevent a steroid flare.  -New plantar fascial brace dispensed as he lost his. -Night splint dispensed -Medrol dose pack -Stretching, icing daily. -Follow-up in 3-4 weeks or sooner if needed. If symptoms continue likely immobilized in a cam boot. -Patient encouraged to call the office with any questions, concerns, change in symptoms.   Celesta Gentile, DPM

## 2017-07-31 NOTE — Patient Instructions (Addendum)
Start the oral steroid (medrol dose pack). Once this is complete you can go back to the meloxciam/mobic (antiinflammatory) but do not take together.    Plantar Fasciitis Rehab Ask your health care provider which exercises are safe for you. Do exercises exactly as told by your health care provider and adjust them as directed. It is normal to feel mild stretching, pulling, tightness, or discomfort as you do these exercises, but you should stop right away if you feel sudden pain or your pain gets worse. Do not begin these exercises until told by your health care provider. Stretching and range of motion exercises These exercises warm up your muscles and joints and improve the movement and flexibility of your foot. These exercises also help to relieve pain. Exercise A: Plantar fascia stretch  1. Sit with your left / right leg crossed over your opposite knee. 2. Hold your heel with one hand with that thumb near your arch. With your other hand, hold your toes and gently pull them back toward the top of your foot. You should feel a stretch on the bottom of your toes or your foot or both. 3. Hold this stretch for__________ seconds. 4. Slowly release your toes and return to the starting position. Repeat __________ times. Complete this exercise __________ times a day. Exercise B: Gastroc, standing  1. Stand with your hands against a wall. 2. Extend your left / right leg behind you, and bend your front knee slightly. 3. Keeping your heels on the floor and keeping your back knee straight, shift your weight toward the wall without arching your back. You should feel a gentle stretch in your left / right calf. 4. Hold this position for __________ seconds. Repeat __________ times. Complete this exercise __________ times a day. Exercise C: Soleus, standing 1. Stand with your hands against a wall. 2. Extend your left / right leg behind you, and bend your front knee slightly. 3. Keeping your heels on the floor,  bend your back knee and slightly shift your weight over the back leg. You should feel a gentle stretch deep in your calf. 4. Hold this position for __________ seconds. Repeat __________ times. Complete this exercise __________ times a day. Exercise D: Gastrocsoleus, standing 1. Stand with the ball of your left / right foot on a step. The ball of your foot is on the walking surface, right under your toes. 2. Keep your other foot firmly on the same step. 3. Hold onto the wall or a railing for balance. 4. Slowly lift your other foot, allowing your body weight to press your heel down over the edge of the step. You should feel a stretch in your left / right calf. 5. Hold this position for __________ seconds. 6. Return both feet to the step. 7. Repeat this exercise with a slight bend in your left / right knee. Repeat __________ times with your left / right knee straight and __________ times with your left / right knee bent. Complete this exercise __________ times a day. Balance exercise This exercise builds your balance and strength control of your arch to help take pressure off your plantar fascia. Exercise E: Single leg stand 1. Without shoes, stand near a railing or in a doorway. You may hold onto the railing or door frame as needed. 2. Stand on your left / right foot. Keep your big toe down on the floor and try to keep your arch lifted. Do not let your foot roll inward. 3. Hold this position for  __________ seconds. 4. If this exercise is too easy, you can try it with your eyes closed or while standing on a pillow. Repeat __________ times. Complete this exercise __________ times a day. This information is not intended to replace advice given to you by your health care provider. Make sure you discuss any questions you have with your health care provider. Document Released: 10/08/2005 Document Revised: 06/12/2016 Document Reviewed: 08/22/2015 Elsevier Interactive Patient Education  2018 [employer].

## 2017-08-29 ENCOUNTER — Ambulatory Visit: Payer: Medicare Other | Admitting: Podiatry

## 2017-09-06 ENCOUNTER — Encounter: Payer: Self-pay | Admitting: Podiatry

## 2017-09-06 ENCOUNTER — Ambulatory Visit (INDEPENDENT_AMBULATORY_CARE_PROVIDER_SITE_OTHER): Payer: Medicare Other | Admitting: Podiatry

## 2017-09-06 DIAGNOSIS — M722 Plantar fascial fibromatosis: Secondary | ICD-10-CM | POA: Diagnosis not present

## 2017-09-11 NOTE — Progress Notes (Signed)
Subjective: Thomas Bowman presents the office today for follow-up evaluation of left heel pain.  He states the pain has been more intermittent and has decreased.  He does wear the plantar fascial brace and he also states that Is been helping.  He denies any numbness or tingling in the pain does not wake him up at night.  He denies any recent injury or trauma.  He has no other concerns today. Denies any systemic complaints such as fevers, chills, nausea, vomiting. No acute changes since last appointment, and no other complaints at this time.   Objective: AAO x3, NAD DP/PT pulses palpable bilaterally, CRT less than 3 seconds There is minimal tenderness to palpation along the plantar medial tubercle of the calcaneus at the insertion of plantar fascia on the left foot. There is no pain along the course of the plantar fascia within the arch of the foot. Plantar fascia appears to be intact. There is no pain with lateral compression of the calcaneus or pain with vibratory sensation. There is no pain along the course or insertion of the achilles tendon. No other areas of tenderness to bilateral lower extremities. No open lesions or pre-ulcerative lesions.  No pain with calf compression, swelling, warmth, erythema  Assessment: Resolving left heel pain, plantar fasciitis  Plan: -All treatment options discussed with the patient including all alternatives, risks, complications.  -At this point had 3 steroid injections will hold off another one today as his pain is much improved as well.  I want him to continue with stretching, icing on a daily basis.  We discussed inserts for shoes as well as shoe modifications.  Continue with night splint.  Discussed other treatment options including EPAT, PT and consider an MRI but wishes to hold off as his pain is improved. -Patient encouraged to call the office with any questions, concerns, change in symptoms.   Trula Slade DPM

## 2017-10-18 ENCOUNTER — Ambulatory Visit: Payer: Medicare Other | Admitting: Podiatry

## 2017-10-18 ENCOUNTER — Telehealth: Payer: Self-pay | Admitting: Podiatry

## 2017-10-18 DIAGNOSIS — M722 Plantar fascial fibromatosis: Secondary | ICD-10-CM

## 2017-10-18 NOTE — Telephone Encounter (Signed)
We had talked about PT. Please get him scheduled with PT at [facility].

## 2017-10-18 NOTE — Telephone Encounter (Signed)
[facility] - In-office referral. I informed pt of the referral.

## 2017-10-18 NOTE — Telephone Encounter (Signed)
Pt. Said you were suppose to send him out somewhere and hes not sure where. He called to r/s his appt.

## 2017-10-18 NOTE — Addendum Note (Signed)
Addended by: Harriett Sine D on: 10/18/2017 03:22 PM   Modules accepted: Orders

## 2017-10-31 ENCOUNTER — Ambulatory Visit: Payer: Medicare Other | Admitting: Podiatry

## 2018-02-18 ENCOUNTER — Encounter (HOSPITAL_COMMUNITY): Payer: Self-pay

## 2018-02-18 ENCOUNTER — Emergency Department (HOSPITAL_COMMUNITY)
Admission: EM | Admit: 2018-02-18 | Discharge: 2018-02-19 | Disposition: A | Discharge status: Home / Self Care | Payer: Medicare Other | Attending: Emergency Medicine | Admitting: Emergency Medicine

## 2018-02-18 ENCOUNTER — Emergency Department (HOSPITAL_COMMUNITY): Payer: Medicare Other

## 2018-02-18 DIAGNOSIS — Z7982 Long term (current) use of aspirin: Secondary | ICD-10-CM | POA: Diagnosis not present

## 2018-02-18 DIAGNOSIS — R251 Tremor, unspecified: Secondary | ICD-10-CM | POA: Insufficient documentation

## 2018-02-18 DIAGNOSIS — R52 Pain, unspecified: Secondary | ICD-10-CM

## 2018-02-18 DIAGNOSIS — Z79899 Other long term (current) drug therapy: Secondary | ICD-10-CM | POA: Diagnosis not present

## 2018-02-18 DIAGNOSIS — M79651 Pain in right thigh: Secondary | ICD-10-CM | POA: Diagnosis not present

## 2018-02-18 DIAGNOSIS — I10 Essential (primary) hypertension: Secondary | ICD-10-CM | POA: Diagnosis not present

## 2018-02-18 LAB — I-STAT CHEM 8, ED
BUN: 16 mg/dL (ref 6–20)
CALCIUM ION: 1.17 mmol/L (ref 1.15–1.40)
CHLORIDE: 105 mmol/L (ref 101–111)
Creatinine, Ser: 1.3 mg/dL — ABNORMAL HIGH (ref 0.61–1.24)
GLUCOSE: 148 mg/dL — AB (ref 65–99)
HCT: 46 % (ref 39.0–52.0)
Hemoglobin: 15.6 g/dL (ref 13.0–17.0)
POTASSIUM: 3.4 mmol/L — AB (ref 3.5–5.1)
Sodium: 144 mmol/L (ref 135–145)
TCO2: 27 mmol/L (ref 22–32)

## 2018-02-18 MED ORDER — RIVAROXABAN 15 MG PO TABS
15.0000 mg | ORAL_TABLET | Freq: Once | ORAL | Status: AC
  Administered 2018-02-19: 15 mg via ORAL
  Filled 2018-02-18 (×2): qty 1

## 2018-02-18 NOTE — ED Triage Notes (Signed)
Pt presents with pain behind R knee that began today.  Pt reports pain radiates down to R foot and up into R axilla.  Pt denies any shortness of breath or chest pain; reports driving to [facility] and back yesterday and driving to [place] today.  Pt unsure of swelling to knee.

## 2018-02-18 NOTE — ED Provider Notes (Signed)
[facility] Provider Note   CSN: [id number] Arrival date & time: 02/18/18  1944     History   Chief Complaint Chief Complaint  Patient presents with  . Leg Pain    HPI Thomas Bowman is a 67 y.o. male.  The history is provided by the patient.  He has a history of hypertension and bone cancer and comes in complaining of pain in his right leg.  Pain started today and states it started in the knee and moved up into his thigh and down towards his foot.  He is a very poor historian and is difficult to get many details.  He does seem to be worse when he stands.  He has not done anything to treat it.  He is unable to put a number on the pain.  He states that he had been cutting down some trees yesterday, but does not recall any unusual trauma or straining.  He also is complaining of a tremor in his right hand for about the last 2 months.  He denies headache and denies any weakness.  He is followed at [facility] for his bone cancer.  Past Medical History:  Diagnosis Date  . Bone cancer ([place]) 90s  . Hypertension     Patient Active Problem List   Diagnosis Date Noted  . Excessive daytime sleepiness 09/13/2014  . OSA (obstructive sleep apnea) 07/27/2014  . Impaired glucose tolerance 05/17/2014  . HTN (hypertension) 05/16/2014  . Chest pain 05/16/2014  . 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 Cheek swelling 06/17/2013  . Acquired flat foot 05/01/2011  . Elevated fasting blood sugar 05/01/2011  . Cancer of skeletal system ([place]) 05/01/2011  . 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 Essential (primary) hypertension 05/01/2011    Past Surgical History:  Procedure Laterality Date  . APPENDECTOMY    . FACIAL NERVE SURGERY Right         Home Medications    Prior to Admission medications   Medication Sig Start Date End Date Taking? Authorizing Provider  amLODipine  (NORVASC) 10 MG tablet TAKE ONE TABLET BY MOUTH ONCE DAILY 08/22/16   Imogene Burn, PA-C  aspirin 81 MG tablet Take 81 mg by mouth daily.    [provider]  cloNIDine (CATAPRES) 0.1 MG tablet Take 1 tablet (0.1 mg total) by mouth 2 (two) times daily. Patient not taking: Reported on 03/25/2017 05/29/16   Imogene Burn, PA-C  hydrochlorothiazide (HYDRODIURIL) 50 MG tablet Take 1 tablet (50 mg total) by mouth daily. Patient not taking: Reported on 03/25/2017 08/27/16   Imogene Burn, PA-C  ibuprofen (ADVIL,MOTRIN) 800 MG tablet Take 1 tablet (800 mg total) by mouth 3 (three) times daily. Patient not taking: Reported on 03/25/2017 02/02/16   Marella Chimes, PA-C  lisinopril (PRINIVIL,ZESTRIL) 40 MG tablet TAKE ONE TABLET BY MOUTH ONCE DAILY Patient not taking: Reported on 03/25/2017 08/22/16   Imogene Burn, PA-C  Magnesium Oxide 200 MG TABS Take 1 tablet (200 mg total) by mouth daily. Patient not taking: Reported on 03/25/2017 08/27/16   Imogene Burn, PA-C  methylPREDNISolone (MEDROL DOSEPAK) 4 MG TBPK tablet Take as directed 07/31/17   Trula Slade, DPM  potassium chloride (K-DUR) 10 MEQ tablet Take 1 tablet (10 mEq total) by mouth daily. Patient not taking: Reported on 03/25/2017 08/27/16   Imogene Burn, PA-C  spironolactone (ALDACTONE) 25 MG tablet Take 1  tablet (25 mg total) by mouth daily. Patient not taking: Reported on 03/25/2017 05/29/16   Imogene Burn, PA-C    Family History Family History  Problem Relation Age of Onset  . Cancer Mother   . Cancer Father   . Hypertension Sister   . Hypertension Brother   . Heart attack Neg Hx     Social History Social History   Tobacco Use  . Smoking status: Never Smoker  . Smokeless tobacco: Never Used  Substance Use Topics  . Alcohol use: No  . Drug use: No     Allergies   Hydralazine hcl and Sulfa antibiotics   Review of Systems Review of Systems  All other systems reviewed and are  negative.    Physical Exam Updated Vital Signs BP (!) 160/90 (BP Location: Right Arm)   Pulse (!) 50   Temp 97.9 F (36.6 C) (Oral)   Resp 17   Ht 5' 6.5" (1.689 m)   Wt 83.9 kg (185 lb)   SpO2 98%   BMI 29.41 kg/m   Physical Exam  Nursing note and vitals reviewed.  67 year old male, resting comfortably and in no acute distress. Vital signs are significant for bradycardia and elevated systolic blood pressure. Oxygen saturation is 98%, which is normal. Head is normocephalic and atraumatic. PERRLA, EOMI. Oropharynx is clear. Neck is nontender and supple without adenopathy or JVD. Back is nontender and there is no CVA tenderness. Lungs are clear without rales, wheezes, or rhonchi. Chest is nontender. Heart has regular rate and rhythm without murmur. Abdomen is soft, flat, nontender without masses or hepatosplenomegaly and peristalsis is normoactive. Extremities: No edema present.  Moderate tenderness in the posterior aspect of the right thigh without any mass palpable.  Right calf circumference is 1 cm greater than left calf circumference.  Right thigh circumference is 2 cm greater than left thigh circumference.  No tenderness over the inguinal fold.  Full passive range of motion present. Skin is warm and dry without rash. Neurologic: Mental status is normal, cranial nerves are intact, there are no motor or sensory deficits.  Strength in the right arm is 5/5.  No tremor identified.  No cogwheel rigidity.  ED Treatments / Results  Labs (all labs ordered are listed, but only abnormal results are displayed) Labs Reviewed  I-STAT CHEM 8, ED - Abnormal; Notable for the following components:      Result Value   Potassium 3.4 (*)    Creatinine, Ser 1.30 (*)    Glucose, Bld 148 (*)    All other components within normal limits    EKG None  Radiology Dg Tibia/fibula Right  Result Date: 02/18/2018 CLINICAL DATA:  Right leg pain x4 days, prior right fibula removal due to bone  cancer EXAM: RIGHT TIBIA AND FIBULA - 2 VIEW COMPARISON:  None. FINDINGS: Prior resection of the mid fibular shaft. Associated surgical clips. No fracture or dislocation is seen. The joint spaces are preserved. Visualized soft tissues are otherwise unremarkable. IMPRESSION: Prior section of the mid fibular shaft. No acute osseus abnormality is seen. Electronically Signed   By: Julian Hy M.D.   On: 02/18/2018 21:17   Dg Femur, Min 2 Views Right  Result Date: 02/18/2018 CLINICAL DATA:  Right leg pain x4 days EXAM: RIGHT FEMUR 2 VIEWS COMPARISON:  None. FINDINGS: No fracture or dislocation is seen. The joint spaces are preserved. 8 mm radiopaque foreign body in the medial aspect of the distal thigh. IMPRESSION: No acute osseous abnormality  is seen. 8 mm radiopaque foreign body in the distal thigh. Electronically Signed   By: Julian Hy M.D.   On: 02/18/2018 21:18    Procedures Procedures   Medications Ordered in ED Medications  Rivaroxaban (XARELTO) tablet 15 mg (has no administration in time range)     Initial Impression / Assessment and Plan / ED Course  I have reviewed the triage vital signs and the nursing notes.  Pertinent labs & imaging results that were available during my care of the patient were reviewed by me and considered in my medical decision making (see chart for details).  Right thigh tenderness and swelling.  Given his history of bone cancer, there is strong clinical concern for DVT.  He is given a dose of rivaroxaban and will be brought back for venous Doppler in the morning.  History of right hand shaking not evident on exam.  I do not see any value in pursuing work-up in the ED at this time, but he is referred to neurology for further evaluation.  Old records are reviewed, and he has no relevant past visits.  Final Clinical Impressions(s) / ED Diagnoses   Final diagnoses:  Pain in right thigh  Tremor of right hand    ED Discharge Orders        Ordered     Ambulatory referral to Neurology    Comments:  An appointment is requested in approximately: 1 week   02/18/18 2339    VAS [place] LOWER EXTREMITY VENOUS (DVT)     44/81/85 6314       Delora Fuel, MD 97/02/63 2354

## 2018-02-18 NOTE — Discharge Instructions (Addendum)
Return in the morning for a venous doppler test to see if you have a blood clot in your leg - if you do, you will need to be on blood thinners. If you don't, then apply ice to the sore area and take acetaminophen as needed for pain. Use crutches as needed. Follow up with the neurologist to evaluate your hand shaking.

## 2018-02-18 NOTE — ED Provider Notes (Signed)
Patient placed in Quick Look pathway, seen and evaluated   Chief Complaint: RLE   HPI:   67 y.o. male with past medical history of bone cancer, hypertension who presents for evaluation of right lower extremity pain that began today.  Patient reports the pain is worsened with walking.  He states most of the pain is behind the knee and in the calf region.  He has not noticed any warmth, swelling, erythema.  Patient denies any preceding trauma, injury, fall.  Patient does report that he recently drove from [facility] last night.  Denies any history of blood clots in his legs, immobilization, hospitalizations..  Patient denies any chest pain, difficulty breathing, fevers.  ROS: RLE   Physical Exam:   Gen: No distress  Neuro: Awake and Alert  Skin: Warm    Focused Exam: Tenderness palpation noted to the posterior aspect of the right knee that extends up to the right calf.  Right lower extremity appears slightly larger than left but no overlying warmth, erythema, edema.  2+ DP pulses bilaterally. Good distal cap refill. RLE is not dusky in appearance or cool to touch.   Initiation of care has begun. The patient has been counseled on the process, plan, and necessity for staying for the completion/evaluation, and the remainder of the medical screening examination   Desma Mcgregor 65/79/03 8333    Delora Fuel, MD 83/29/19 2348

## 2018-02-19 ENCOUNTER — Ambulatory Visit (HOSPITAL_BASED_OUTPATIENT_CLINIC_OR_DEPARTMENT_OTHER)
Admission: RE | Admit: 2018-02-19 | Discharge: 2018-02-19 | Disposition: A | Discharge status: Home / Self Care | Payer: Medicare Other | Source: Ambulatory Visit | Attending: Emergency Medicine | Admitting: Emergency Medicine

## 2018-02-19 DIAGNOSIS — R251 Tremor, unspecified: Secondary | ICD-10-CM | POA: Diagnosis not present

## 2018-02-19 DIAGNOSIS — Z79899 Other long term (current) drug therapy: Secondary | ICD-10-CM | POA: Diagnosis not present

## 2018-02-19 DIAGNOSIS — M79609 Pain in unspecified limb: Secondary | ICD-10-CM

## 2018-02-19 DIAGNOSIS — Z7982 Long term (current) use of aspirin: Secondary | ICD-10-CM | POA: Diagnosis not present

## 2018-02-19 DIAGNOSIS — M7989 Other specified soft tissue disorders: Secondary | ICD-10-CM

## 2018-02-19 DIAGNOSIS — R609 Edema, unspecified: Secondary | ICD-10-CM

## 2018-02-19 DIAGNOSIS — M79659 Pain in unspecified thigh: Secondary | ICD-10-CM

## 2018-02-19 DIAGNOSIS — M79651 Pain in right thigh: Secondary | ICD-10-CM | POA: Diagnosis present

## 2018-02-19 DIAGNOSIS — I10 Essential (primary) hypertension: Secondary | ICD-10-CM | POA: Diagnosis not present

## 2018-02-19 NOTE — ED Notes (Signed)
PT states understanding of care given, follow up care, and medication prescribed. PT is ambulated from ED to car with a steady gait.  

## 2018-02-19 NOTE — Progress Notes (Signed)
Right lower extremity venous duplex completed. Preliminary results - No evidence of DVT, superficial thrombosis, or Baker's cyst. Toma Copier, RVS 02/19/2018 8:33 AM

## 2018-02-26 DIAGNOSIS — H9190 Unspecified hearing loss, unspecified ear: Secondary | ICD-10-CM | POA: Insufficient documentation

## 2018-02-26 DIAGNOSIS — M25511 Pain in right shoulder: Secondary | ICD-10-CM | POA: Insufficient documentation

## 2018-02-26 DIAGNOSIS — E559 Vitamin D deficiency, unspecified: Secondary | ICD-10-CM | POA: Insufficient documentation

## 2018-02-26 DIAGNOSIS — R251 Tremor, unspecified: Secondary | ICD-10-CM | POA: Insufficient documentation

## 2018-02-26 DIAGNOSIS — E669 Obesity, unspecified: Secondary | ICD-10-CM | POA: Insufficient documentation

## 2018-03-26 ENCOUNTER — Telehealth: Payer: Self-pay | Admitting: Neurology

## 2018-03-26 ENCOUNTER — Ambulatory Visit (INDEPENDENT_AMBULATORY_CARE_PROVIDER_SITE_OTHER): Payer: Medicare Other | Admitting: Neurology

## 2018-03-26 ENCOUNTER — Encounter: Payer: Self-pay | Admitting: Neurology

## 2018-03-26 ENCOUNTER — Encounter

## 2018-03-26 VITALS — BP 183/99 | HR 70 | Ht 66.5 in | Wt 187.0 lb

## 2018-03-26 DIAGNOSIS — G8929 Other chronic pain: Secondary | ICD-10-CM | POA: Diagnosis not present

## 2018-03-26 DIAGNOSIS — M25511 Pain in right shoulder: Secondary | ICD-10-CM | POA: Diagnosis not present

## 2018-03-26 MED ORDER — DULOXETINE HCL 60 MG PO CPEP: 60.0000 mg | ORAL_CAPSULE | Freq: Every day | ORAL | 12 refills | Status: DC

## 2018-03-26 NOTE — Progress Notes (Signed)
PATIENT: Thomas Bowman DOB: Dec 06, 1950  Chief Complaint  Patient presents with  . New Patient (Initial Visit)    Patient here alone. Pt reports having pain down his right side.      HISTORICAL  Thomas Bowman is a 67 year old male, seen in refer by his primary care medicine, [facility] for evaluation of right shoulder pain, initial evaluation was on March 26, 2018.  He had a history of right jaw melanoma, had surgery at [facility], followed by extensive reconstruction surgery, including right pectoralis major muscle pull-through to reconstruct his right jaw, he also had right fibular bone resection used for reconstruction, this was followed by radiation therapy.  He had a huge scar in front of right pectoralis major from previous surgery, since January 2019, he complains of constant right shoulder pain at previous surgical site, limited range of motion, going down right arm, he also noticed right lateral leg pain, from previous right lateral leg surgical side,  He denies low back pain, he does complains of right-sided neck pain, radiating pain from right neck to right shoulder anterior chest  Laboratory evaluation on February 18, 2018, creatinine of 1.30, hemoglobin of 15.6  REVIEW OF SYSTEMS: Full 14 system review of systems performed and notable only for as above ALLERGIES: Allergies  Allergen Reactions  . Hydralazine Hcl     Pt experienced severe cramps and aches in hands and legs when dose increased from 25 TID to 50 TID. Resolved upon discontinuation of drug.  . Sulfa Antibiotics Rash    HOME MEDICATIONS: Current Outpatient Medications  Medication Sig Dispense Refill  . amLODipine (NORVASC) 10 MG tablet TAKE ONE TABLET BY MOUTH ONCE DAILY 90 tablet 3  . aspirin 81 MG tablet Take 81 mg by mouth daily.    . cloNIDine (CATAPRES) 0.1 MG tablet Take 1 tablet (0.1 mg total) by mouth 2 (two) times daily. 60 tablet 3  . hydrochlorothiazide (HYDRODIURIL) 50 MG tablet Take 1  tablet (50 mg total) by mouth daily. 90 tablet 2  . ibuprofen (ADVIL,MOTRIN) 800 MG tablet Take 1 tablet (800 mg total) by mouth 3 (three) times daily. 21 tablet 0  . lisinopril (PRINIVIL,ZESTRIL) 40 MG tablet TAKE ONE TABLET BY MOUTH ONCE DAILY 90 tablet 3  . Magnesium Oxide 200 MG TABS Take 1 tablet (200 mg total) by mouth daily. 90 tablet 2  . potassium chloride (K-DUR) 10 MEQ tablet Take 1 tablet (10 mEq total) by mouth daily. 90 tablet 2  . spironolactone (ALDACTONE) 25 MG tablet Take 1 tablet (25 mg total) by mouth daily. 30 tablet 3  . methylPREDNISolone (MEDROL DOSEPAK) 4 MG TBPK tablet Take as directed (Patient not taking: Reported on 03/26/2018) 21 tablet 0   Current Facility-Administered Medications  Medication Dose Route Frequency Provider Last Rate Last Dose  . betamethasone acetate-betamethasone sodium phosphate (CELESTONE) injection 3 mg  3 mg Intramuscular Once Daylene Katayama M, DPM      . betamethasone acetate-betamethasone sodium phosphate (CELESTONE) injection 3 mg  3 mg Intramuscular Once Edrick Kins, DPM        PAST MEDICAL HISTORY: Past Medical History:  Diagnosis Date  . Bone cancer ([place]) 90s  . Hypertension     PAST SURGICAL HISTORY: Past Surgical History:  Procedure Laterality Date  . APPENDECTOMY    . FACIAL NERVE SURGERY Right     FAMILY HISTORY: Family History  Problem Relation Age of Onset  . Cancer Mother   . Cancer Father   .  Hypertension Sister   . Hypertension Brother   . Heart attack Neg Hx     SOCIAL HISTORY:  Social History   Socioeconomic History  . Marital status: Married    Spouse name: Not on file  . Number of children: Not on file  . Years of education: Not on file  . Highest education level: Not on file  Occupational History  . Not on file  Social Needs  . Financial resource strain: Not on file  . Food insecurity:    Worry: Not on file    Inability: Not on file  . Transportation needs:    Medical: Not on file     Non-medical: Not on file  Tobacco Use  . Smoking status: Never Smoker  . Smokeless tobacco: Never Used  Substance and Sexual Activity  . Alcohol use: No    Frequency: Never  . Drug use: No  . Sexual activity: Not on file  Lifestyle  . Physical activity:    Days per week: Not on file    Minutes per session: Not on file  . Stress: Not on file  Relationships  . Social connections:    Talks on phone: Not on file    Gets together: Not on file    Attends religious service: Not on file    Active member of club or organization: Not on file    Attends meetings of clubs or organizations: Not on file    Relationship status: Not on file  . Intimate partner violence:    Fear of current or ex partner: Not on file    Emotionally abused: Not on file    Physically abused: Not on file    Forced sexual activity: Not on file  Other Topics Concern  . Not on file  Social History Narrative  . Not on file     PHYSICAL EXAM   Vitals:   03/26/18 0847  BP: (!) 183/99  Pulse: 70  Weight: 187 lb (84.8 kg)  Height: 5' 6.5" (1.689 m)    Not recorded      Body mass index is 29.73 kg/m.  PHYSICAL EXAMNIATION:  Gen: NAD, conversant, well nourised, obese, well groomed                     Cardiovascular: Regular rate rhythm, no peripheral edema, warm, nontender. Eyes: Conjunctivae clear without exudates or hemorrhage Neck: Supple, no carotid bruits. Pulmonary: Clear to auscultation bilaterally   NEUROLOGICAL EXAM:  MENTAL STATUS: Speech:    Speech is normal; fluent and spontaneous with normal comprehension.  Cognition:     Orientation to time, place and person     Normal recent and remote memory     Normal Attention span and concentration     Normal Language, naming, repeating,spontaneous speech     Fund of knowledge   CRANIAL NERVES: CN II: Visual fields are full to confrontation. Pupils are round equal and briskly reactive to light. CN III, IV, VI: extraocular movement are  normal. No ptosis. CN V: Facial sensation is intact to pinprick in all 3 divisions bilaterally. Corneal responses are intact.  CN VII: Deformity of right lower jaw, CN VIII: Hearing is normal to rubbing fingers CN IX, X: Palate elevates symmetrically. Phonation is normal. CN XI: Head turning and shoulder shrug are intact CN XII: Tongue is midline with normal movements and no atrophy.  MOTOR: Scar along right pectoralis major muscle, mild weakness of right shoulder external rotation abduction, though examination  is also limited due to pain, tenderness of the right anterior shoulder region but not at the shoulder joints,  REFLEXES: Reflexes are 2+ and symmetric at the biceps, triceps, knees, and ankles. Plantar responses are flexor.  SENSORY: Intact to light touch, pinprick, positional sensation and vibratory sensation are intact in fingers and toes.  COORDINATION: Rapid alternating movements and fine finger movements are intact. There is no dysmetria on finger-to-nose and heel-knee-shin.    GAIT/STANCE: Posture is normal. Gait is steady with normal steps, base, arm swing, and turning. Heel and toe walking are normal. Tandem gait is normal.  Romberg is absent.   DIAGNOSTIC DATA (LABS, IMAGING, TESTING) - I reviewed patient records, labs, notes, testing and imaging myself where available.   ASSESSMENT AND PLAN  STEVIE CHARTER is a 67 y.o. male   Right shoulder pain,  Previous history of right pectoralis major muscle pull-through for reconstruction of right jaw,  Laboratory evaluation for inflammatory markers  MRI of right shoulder, not a candidate for contrast due to elevated creatinine   Marcial Pacas, M.D. Ph.D.  [facility] [address], [place], [place] [address] Ph: [phone] Fax: [phone]  CC:  Medicine, [facility]

## 2018-03-26 NOTE — Telephone Encounter (Signed)
Medicare/medicaid order sent to GI. No auth they will reach out to the pt to schedule.  °

## 2018-03-27 ENCOUNTER — Telehealth: Payer: Self-pay | Admitting: *Deleted

## 2018-03-27 ENCOUNTER — Other Ambulatory Visit: Payer: Self-pay | Admitting: Neurology

## 2018-03-27 DIAGNOSIS — T1590XA Foreign body on external eye, part unspecified, unspecified eye, initial encounter: Secondary | ICD-10-CM

## 2018-03-27 LAB — CBC WITH DIFFERENTIAL
BASOS ABS: 0 10*3/uL (ref 0.0–0.2)
Basos: 1 %
EOS (ABSOLUTE): 0.2 10*3/uL (ref 0.0–0.4)
Eos: 4 %
HEMOGLOBIN: 14.6 g/dL (ref 13.0–17.7)
Hematocrit: 43.2 % (ref 37.5–51.0)
IMMATURE GRANS (ABS): 0 10*3/uL (ref 0.0–0.1)
Immature Granulocytes: 0 %
LYMPHS: 40 %
Lymphocytes Absolute: 1.7 10*3/uL (ref 0.7–3.1)
MCH: 29.4 pg (ref 26.6–33.0)
MCHC: 33.8 g/dL (ref 31.5–35.7)
MCV: 87 fL (ref 79–97)
MONOCYTES: 14 %
Monocytes Absolute: 0.6 10*3/uL (ref 0.1–0.9)
NEUTROS ABS: 1.8 10*3/uL (ref 1.4–7.0)
NEUTROS PCT: 41 %
RBC: 4.96 x10E6/uL (ref 4.14–5.80)
RDW: 14.1 % (ref 12.3–15.4)
WBC: 4.3 10*3/uL (ref 3.4–10.8)

## 2018-03-27 LAB — CK: Total CK: 136 U/L (ref 24–204)

## 2018-03-27 LAB — SEDIMENTATION RATE: SED RATE: 18 mm/h (ref 0–30)

## 2018-03-27 LAB — TSH: TSH: 0.658 u[IU]/mL (ref 0.450–4.500)

## 2018-03-27 LAB — C-REACTIVE PROTEIN: CRP: 2 mg/L (ref 0.0–4.9)

## 2018-03-27 NOTE — Telephone Encounter (Signed)
-----   Message from Marcial Pacas, MD sent at 03/27/2018 10:07 AM EDT ----- Please call patient for normal laboratory result

## 2018-03-27 NOTE — Telephone Encounter (Signed)
Left message with normal labs.  Provided our number to call back with any questions.

## 2018-04-02 ENCOUNTER — Ambulatory Visit
Admission: RE | Admit: 2018-04-02 | Discharge: 2018-04-02 | Disposition: A | Discharge status: Home / Self Care | Payer: Medicare Other | Source: Ambulatory Visit | Attending: Neurology | Admitting: Neurology

## 2018-04-02 DIAGNOSIS — M25511 Pain in right shoulder: Principal | ICD-10-CM

## 2018-04-02 DIAGNOSIS — G8929 Other chronic pain: Secondary | ICD-10-CM

## 2018-04-02 DIAGNOSIS — T1590XA Foreign body on external eye, part unspecified, unspecified eye, initial encounter: Secondary | ICD-10-CM

## 2018-04-03 ENCOUNTER — Telehealth: Payer: Self-pay | Admitting: Neurology

## 2018-04-03 NOTE — Telephone Encounter (Signed)
Please call patient, MRI of right shoulder showed degenerative changes, which might explain his right anterior shoulder pain, will refer him to orthopedic, let him keep follow up in August    IMPRESSION: Rotator cuff tendinopathy most notable in the supraspinatus where there is a very small interstitial tear. No full-thickness rotator cuff tear or tendon tear retraction is present.  Moderate acromioclavicular osteoarthritis. Type 1 acromion is downsloping with some subacromial spurring.  Small volume of subacromial/subdeltoid fluid compatible with bursitis.  Small ganglion cyst in the subscapularis muscle belly.

## 2018-06-12 ENCOUNTER — Telehealth: Payer: Self-pay | Admitting: *Deleted

## 2018-06-12 ENCOUNTER — Ambulatory Visit (INDEPENDENT_AMBULATORY_CARE_PROVIDER_SITE_OTHER): Payer: Medicare Other | Admitting: Neurology

## 2018-06-12 ENCOUNTER — Encounter: Payer: Self-pay | Admitting: Neurology

## 2018-06-12 VITALS — BP 172/93 | HR 64 | Ht 66.5 in | Wt 182.5 lb

## 2018-06-12 DIAGNOSIS — G8929 Other chronic pain: Secondary | ICD-10-CM

## 2018-06-12 DIAGNOSIS — M25511 Pain in right shoulder: Secondary | ICD-10-CM | POA: Diagnosis not present

## 2018-06-12 MED ORDER — LIDOCAINE-PRILOCAINE 2.5-2.5 % EX CREA: 1.0000 "application " | TOPICAL_CREAM | CUTANEOUS | 11 refills | Status: DC | PRN

## 2018-06-12 MED ORDER — DICLOFENAC SODIUM 1 % TD GEL: 4.0000 g | Freq: Four times a day (QID) | TRANSDERMAL | 11 refills | Status: AC

## 2018-06-12 NOTE — Progress Notes (Signed)
PATIENT: Thomas Bowman DOB: [birth year]  Chief Complaint  Patient presents with  . Chronic right shoulder pain    His shoulder pain has somewhat improved.  He would like to further review his MRI results.  He is agreeable to see orthopaedics, if necessary.     HISTORICAL  Thomas Bowman is a 67 year old male, seen in refer by his primary care medicine, [facility] for evaluation of right shoulder pain, initial evaluation was on March 26, 2018.  He had a history of right jaw melanoma, had surgery at [facility], followed by extensive reconstruction surgery, including right pectoralis major muscle pull-through to reconstruct his right jaw, he also had right fibular bone resection used for reconstruction, this was followed by radiation therapy.  He had a huge scar in front of right pectoralis major from previous surgery, since January 2019, he complains of constant right shoulder pain at previous surgical site, limited range of motion, going down right arm, he also noticed right lateral leg pain, from previous right lateral leg surgical side,  He denies low back pain, he does complains of right-sided neck pain, radiating pain from right neck to right shoulder anterior chest  Laboratory evaluation on February 18, 2018, creatinine of 1.30, hemoglobin of 15.6  UPDATE June 12 2018: His right shoulder pain has much improved with limiting certain heavy lifting, MRI of the right shoulder on April 02, 2018 showed rotator cuff tendinopathy most noticeable in the supraspinatus, where there is a small interstitial tear, moderate acromicoclavicular osteoarthritis, with some subacromial spurring,  He denies significant neck pain  REVIEW OF SYSTEMS: Full 14 system review of systems performed and notable only for as above ALLERGIES: Allergies  Allergen Reactions  . Hydralazine Hcl     Pt experienced severe cramps and aches in hands and legs when dose increased from 25 TID to 50 TID. Resolved upon  discontinuation of drug.  . Sulfa Antibiotics Rash    HOME MEDICATIONS: Current Outpatient Medications  Medication Sig Dispense Refill  . amLODipine (NORVASC) 10 MG tablet TAKE ONE TABLET BY MOUTH ONCE DAILY 90 tablet 3  . aspirin 81 MG tablet Take 81 mg by mouth daily.    . cloNIDine (CATAPRES) 0.1 MG tablet Take 1 tablet (0.1 mg total) by mouth 2 (two) times daily. 60 tablet 3  . DULoxetine (CYMBALTA) 60 MG capsule Take 1 capsule (60 mg total) by mouth daily. 30 capsule 12  . hydrochlorothiazide (HYDRODIURIL) 50 MG tablet Take 1 tablet (50 mg total) by mouth daily. 90 tablet 2  . ibuprofen (ADVIL,MOTRIN) 800 MG tablet Take 1 tablet (800 mg total) by mouth 3 (three) times daily. 21 tablet 0  . lisinopril (PRINIVIL,ZESTRIL) 40 MG tablet TAKE ONE TABLET BY MOUTH ONCE DAILY 90 tablet 3  . Magnesium Oxide 200 MG TABS Take 1 tablet (200 mg total) by mouth daily. 90 tablet 2  . methylPREDNISolone (MEDROL DOSEPAK) 4 MG TBPK tablet Take as directed 21 tablet 0  . potassium chloride (K-DUR) 10 MEQ tablet Take 1 tablet (10 mEq total) by mouth daily. 90 tablet 2  . spironolactone (ALDACTONE) 25 MG tablet Take 1 tablet (25 mg total) by mouth daily. 30 tablet 3   No current facility-administered medications for this visit.     PAST MEDICAL HISTORY: Past Medical History:  Diagnosis Date  . Bone cancer ([place]) 90s  . Hypertension     PAST SURGICAL HISTORY: Past Surgical History:  Procedure Laterality Date  . APPENDECTOMY    .  FACIAL NERVE SURGERY Right     FAMILY HISTORY: Family History  Problem Relation Age of Onset  . Cancer Mother   . Cancer Father   . Hypertension Sister   . Hypertension Brother   . Heart attack Neg Hx     SOCIAL HISTORY:  Social History   Socioeconomic History  . Marital status: Married    Spouse name: Not on file  . Number of children: Not on file  . Years of education: Not on file  . Highest education level: Not on file  Occupational History  . Not on  file  Social Needs  . Financial resource strain: Not on file  . Food insecurity:    Worry: Not on file    Inability: Not on file  . Transportation needs:    Medical: Not on file    Non-medical: Not on file  Tobacco Use  . Smoking status: Never Smoker  . Smokeless tobacco: Never Used  Substance and Sexual Activity  . Alcohol use: No    Frequency: Never  . Drug use: No  . Sexual activity: Not on file  Lifestyle  . Physical activity:    Days per week: Not on file    Minutes per session: Not on file  . Stress: Not on file  Relationships  . Social connections:    Talks on phone: Not on file    Gets together: Not on file    Attends religious service: Not on file    Active member of club or organization: Not on file    Attends meetings of clubs or organizations: Not on file    Relationship status: Not on file  . Intimate partner violence:    Fear of current or ex partner: Not on file    Emotionally abused: Not on file    Physically abused: Not on file    Forced sexual activity: Not on file  Other Topics Concern  . Not on file  Social History Narrative  . Not on file     PHYSICAL EXAM   Vitals:   06/12/18 1008  BP: (!) 172/93  Pulse: 64  Weight: 182 lb 8 oz (82.8 kg)  Height: 5' 6.5" (1.689 m)    Not recorded      Body mass index is 29.01 kg/m.  PHYSICAL EXAMNIATION:  Gen: NAD, conversant, well nourised, obese, well groomed                     Cardiovascular: Regular rate rhythm, no peripheral edema, warm, nontender. Eyes: Conjunctivae clear without exudates or hemorrhage Neck: Supple, no carotid bruits. Pulmonary: Clear to auscultation bilaterally   NEUROLOGICAL EXAM:  MENTAL STATUS: Speech:    Speech is normal; fluent and spontaneous with normal comprehension.  Cognition:     Orientation to time, place and person     Normal recent and remote memory     Normal Attention span and concentration     Normal Language, naming, repeating,spontaneous  speech     Fund of knowledge   CRANIAL NERVES: CN II: Visual fields are full to confrontation. Pupils are round equal and briskly reactive to light. CN III, IV, VI: extraocular movement are normal. No ptosis. CN V: Facial sensation is intact to pinprick in all 3 divisions bilaterally. Corneal responses are intact.  CN VII: Deformity of right lower jaw, CN VIII: Hearing is normal to rubbing fingers CN IX, X: Palate elevates symmetrically. Phonation is normal. CN XI: Head turning and shoulder  shrug are intact CN XII: Tongue is midline with normal movements and no atrophy.  MOTOR: Scar along right pectoralis major muscle, mild weakness of right shoulder external rotation abduction, though examination is also limited due to pain, tenderness of the right anterior shoulder region but not at the shoulder joints,  REFLEXES: Reflexes are 2+ and symmetric at the biceps, triceps, knees, and ankles. Plantar responses are flexor.  SENSORY: Intact to light touch, pinprick, positional sensation and vibratory sensation are intact in fingers and toes.  COORDINATION: Rapid alternating movements and fine finger movements are intact. There is no dysmetria on finger-to-nose and heel-knee-shin.    GAIT/STANCE: Posture is normal. Gait is steady with normal steps, base, arm swing, and turning. Heel and toe walking are normal. Tandem gait is normal.  Romberg is absent.   DIAGNOSTIC DATA (LABS, IMAGING, TESTING) - I reviewed patient records, labs, notes, testing and imaging myself where available.   ASSESSMENT AND PLAN  Thomas Bowman is a 67 y.o. male   Right shoulder pain,  Previous history of right pectoralis major muscle pull-through for reconstruction of right jaw,  Laboratory evaluation for inflammatory markers showed normal ESR C-reactive protein TSH, CBC,  MRI of right shoulder show degenerative changes,  His right shoulder pain overall has much improved, will try lidocaine, diclofenac gel,  He  wants to hold off orthopedic evaluation at this point   Marcial Pacas, M.D. Ph.D.  [facility] [address] [place], [place] [address] Ph: [phone] Fax: [phone]  CC:  Medicine, [facility]

## 2018-06-12 NOTE — Telephone Encounter (Addendum)
Patient previously informed of results and agreeable to orthopaedic referral.  He has a pending follow up appointment.

## 2018-06-12 NOTE — Telephone Encounter (Signed)
No showed follow up appointment. 

## 2018-06-12 NOTE — Telephone Encounter (Signed)
Patient had an appt at 8:30am.  He confused the time.  He has able to added back on the schedule today in a canceled appt time.

## 2018-06-12 NOTE — Telephone Encounter (Signed)
Insurance would not cover lido/prilocn 2.5-2.5% cream.  Per the pharmacist, they offer OTC Lidocaine 4%.  Per vo by Dr. Krista Blue, this will be okay for the patient.  I was unable to reach the patient by phone - no answer and machine full.  The pharmacy will notify him when he comes to pick up his medication.

## 2018-08-04 ENCOUNTER — Ambulatory Visit: Payer: Medicare Other | Admitting: Neurology

## 2018-08-04 ENCOUNTER — Encounter

## 2018-09-12 ENCOUNTER — Encounter

## 2018-12-29 ENCOUNTER — Encounter (HOSPITAL_COMMUNITY): Payer: Self-pay | Admitting: Emergency Medicine

## 2018-12-29 ENCOUNTER — Emergency Department (HOSPITAL_COMMUNITY): Payer: Medicare Other

## 2018-12-29 ENCOUNTER — Emergency Department (HOSPITAL_COMMUNITY)
Admission: EM | Admit: 2018-12-29 | Discharge: 2018-12-29 | Disposition: A | Discharge status: Home / Self Care | Payer: Medicare Other | Attending: Emergency Medicine | Admitting: Emergency Medicine

## 2018-12-29 DIAGNOSIS — Z79899 Other long term (current) drug therapy: Secondary | ICD-10-CM | POA: Diagnosis not present

## 2018-12-29 DIAGNOSIS — Z85830 Personal history of malignant neoplasm of bone: Secondary | ICD-10-CM | POA: Insufficient documentation

## 2018-12-29 DIAGNOSIS — R072 Precordial pain: Secondary | ICD-10-CM | POA: Diagnosis not present

## 2018-12-29 DIAGNOSIS — R51 Headache: Secondary | ICD-10-CM | POA: Diagnosis not present

## 2018-12-29 DIAGNOSIS — B349 Viral infection, unspecified: Secondary | ICD-10-CM | POA: Diagnosis not present

## 2018-12-29 DIAGNOSIS — I10 Essential (primary) hypertension: Secondary | ICD-10-CM | POA: Diagnosis not present

## 2018-12-29 DIAGNOSIS — R079 Chest pain, unspecified: Secondary | ICD-10-CM | POA: Diagnosis present

## 2018-12-29 DIAGNOSIS — Z7982 Long term (current) use of aspirin: Secondary | ICD-10-CM | POA: Insufficient documentation

## 2018-12-29 LAB — CBC
HEMATOCRIT: 44.1 % (ref 39.0–52.0)
HEMOGLOBIN: 14.2 g/dL (ref 13.0–17.0)
MCH: 27.2 pg (ref 26.0–34.0)
MCHC: 32.2 g/dL (ref 30.0–36.0)
MCV: 84.5 fL (ref 80.0–100.0)
Platelets: 130 10*3/uL — ABNORMAL LOW (ref 150–400)
RBC: 5.22 MIL/uL (ref 4.22–5.81)
RDW: 12.6 % (ref 11.5–15.5)
WBC: 3.8 10*3/uL — AB (ref 4.0–10.5)
nRBC: 0 % (ref 0.0–0.2)

## 2018-12-29 LAB — COMPREHENSIVE METABOLIC PANEL
ALK PHOS: 63 U/L (ref 38–126)
ALT: 18 U/L (ref 0–44)
ANION GAP: 9 (ref 5–15)
AST: 22 U/L (ref 15–41)
Albumin: 3.5 g/dL (ref 3.5–5.0)
BUN: 10 mg/dL (ref 8–23)
CHLORIDE: 103 mmol/L (ref 98–111)
CO2: 22 mmol/L (ref 22–32)
Calcium: 8.3 mg/dL — ABNORMAL LOW (ref 8.9–10.3)
Creatinine, Ser: 1.04 mg/dL (ref 0.61–1.24)
GFR calc Af Amer: >60 mL/min (ref >60)
GLUCOSE: 171 mg/dL — AB (ref 70–99)
Potassium: 3.6 mmol/L (ref 3.5–5.1)
Sodium: 134 mmol/L — ABNORMAL LOW (ref 135–145)
TOTAL PROTEIN: 6.6 g/dL (ref 6.5–8.1)
Total Bilirubin: 0.9 mg/dL (ref 0.3–1.2)

## 2018-12-29 LAB — INFLUENZA PANEL BY PCR (TYPE A & B)
INFLBPCR: NEGATIVE
Influenza A By PCR: POSITIVE — AB

## 2018-12-29 LAB — TROPONIN I: Troponin I: <0.03 ng/mL (ref <0.03)

## 2018-12-29 MED ORDER — ONDANSETRON 8 MG PO TBDP: 8.0000 mg | ORAL_TABLET | Freq: Three times a day (TID) | ORAL | 0 refills | Status: DC | PRN

## 2018-12-29 MED ORDER — MORPHINE SULFATE (PF) 4 MG/ML IV SOLN
4.0000 mg | Freq: Once | INTRAVENOUS | Status: AC
  Administered 2018-12-29: 4 mg via INTRAVENOUS
  Filled 2018-12-29: qty 1

## 2018-12-29 MED ORDER — SODIUM CHLORIDE 0.9 % IV BOLUS
1000.0000 mL | Freq: Once | INTRAVENOUS | Status: AC
  Administered 2018-12-29: 1000 mL via INTRAVENOUS

## 2018-12-29 NOTE — ED Provider Notes (Signed)
[facility] EMERGENCY DEPARTMENT Provider Note   CSN: [id number] Arrival date & time: 12/29/18  1303    History   Chief Complaint Chief Complaint  Patient presents with  . Chest Pain    HPI Thomas Bowman is a 68 y.o. male.     HPI Patient reports frontal headache with radiation down into his chest as well as pain down into his right leg.  This been present for several days but worsened over the past 24 hours.  He was given aspirin and nitroglycerin by EMS.  He developed nausea and vomiting in route.  He reports chills at home.  No fever.  History of hypertension and head neck cancer denies weakness of the arms or legs.  Reports chills and low-grade fever. no shortness of breath at this time.  Some productive cough..  Reports frontal headache.  No change in his vision.  No weakness of his arms or legs.  No back pain.   Past Medical History:  Diagnosis Date  . Bone cancer ([place]) 90s  . Hypertension     Patient Active Problem List   Diagnosis Date Noted  . Chronic right shoulder pain 03/26/2018  . Excessive daytime sleepiness 09/13/2014  . OSA (obstructive sleep apnea) 07/27/2014  . Impaired glucose tolerance 05/17/2014  . HTN (hypertension) 05/16/2014  . Chest pain 05/16/2014  . 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 Cheek swelling 06/17/2013  . Acquired flat foot 05/01/2011  . Elevated fasting blood sugar 05/01/2011  . Cancer of skeletal system ([place]) 05/01/2011  . 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 Essential (primary) hypertension 05/01/2011    Past Surgical History:  Procedure Laterality Date  . APPENDECTOMY    . FACIAL NERVE SURGERY Right         Home Medications    Prior to Admission medications   Medication Sig Start Date End Date Taking? Authorizing Provider  amLODipine (NORVASC) 10 MG tablet TAKE ONE TABLET BY MOUTH ONCE DAILY 08/22/16   Imogene Burn, PA-C    aspirin 81 MG tablet Take 81 mg by mouth daily.    [provider]  cloNIDine (CATAPRES) 0.1 MG tablet Take 1 tablet (0.1 mg total) by mouth 2 (two) times daily. 05/29/16   Imogene Burn, PA-C  diclofenac sodium (VOLTAREN) 1 % GEL Apply 4 g topically 4 (four) times daily. 06/12/18   Marcial Pacas, MD  DULoxetine (CYMBALTA) 60 MG capsule Take 1 capsule (60 mg total) by mouth daily. 03/26/18   Marcial Pacas, MD  hydrochlorothiazide (HYDRODIURIL) 50 MG tablet Take 1 tablet (50 mg total) by mouth daily. 08/27/16   Imogene Burn, PA-C  ibuprofen (ADVIL,MOTRIN) 800 MG tablet Take 1 tablet (800 mg total) by mouth 3 (three) times daily. 02/02/16   Marella Chimes, PA-C  lidocaine-prilocaine (EMLA) cream Apply 1 application topically as needed. 06/12/18   Marcial Pacas, MD  lisinopril (PRINIVIL,ZESTRIL) 40 MG tablet TAKE ONE TABLET BY MOUTH ONCE DAILY 08/22/16   Imogene Burn, PA-C  Magnesium Oxide 200 MG TABS Take 1 tablet (200 mg total) by mouth daily. 08/27/16   Imogene Burn, PA-C  methylPREDNISolone (MEDROL DOSEPAK) 4 MG TBPK tablet Take as directed 07/31/17   Trula Slade, DPM  potassium chloride (K-DUR) 10 MEQ tablet Take 1 tablet (10 mEq total) by mouth daily. 08/27/16   Imogene Burn, PA-C  spironolactone (ALDACTONE) 25 MG tablet Take 1 tablet (25 mg total)  by mouth daily. 05/29/16   Imogene Burn, PA-C    Family History Family History  Problem Relation Age of Onset  . Cancer Mother   . Cancer Father   . Hypertension Sister   . Hypertension Brother   . Heart attack Neg Hx     Social History Social History   Tobacco Use  . Smoking status: Never Smoker  . Smokeless tobacco: Never Used  Substance Use Topics  . Alcohol use: No    Frequency: Never  . Drug use: No     Allergies   Hydralazine hcl and Sulfa antibiotics   Review of Systems Review of Systems  All other systems reviewed and are negative.    Physical Exam Updated Vital Signs There were no  vitals taken for this visit.  Physical Exam Vitals signs and nursing note reviewed.  Constitutional:      Appearance: He is well-developed.  HENT:     Head: Normocephalic and atraumatic.  Eyes:     Pupils: Pupils are equal, round, and reactive to light.  Neck:     Musculoskeletal: Normal range of motion.  Cardiovascular:     Rate and Rhythm: Normal rate and regular rhythm.     Heart sounds: Normal heart sounds.  Pulmonary:     Effort: Pulmonary effort is normal. No respiratory distress.     Breath sounds: Normal breath sounds.  Abdominal:     General: There is no distension.     Palpations: Abdomen is soft.     Tenderness: There is no abdominal tenderness.  Musculoskeletal: Normal range of motion.  Skin:    General: Skin is warm and dry.  Neurological:     Mental Status: He is alert and oriented to person, place, and time.     Comments: 5/5 strength in major muscle groups of  bilateral upper and lower extremities. Speech normal. No facial asymetry.   Psychiatric:        Judgment: Judgment normal.      ED Treatments / Results  Labs (all labs ordered are listed, but only abnormal results are displayed) Labs Reviewed  CBC - Abnormal; Notable for the following components:      Result Value   WBC 3.8 (*)    Platelets 130 (*)    All other components within normal limits  COMPREHENSIVE METABOLIC PANEL - Abnormal; Notable for the following components:   Sodium 134 (*)    Glucose, Bld 171 (*)    Calcium 8.3 (*)    All other components within normal limits  TROPONIN I  INFLUENZA PANEL BY PCR (TYPE A & B)    EKG EKG Interpretation  Date/Time:  Monday December 29 2018 13:12:14 EDT Ventricular Rate:  92 PR Interval:    QRS Duration: 86 QT Interval:  341 QTC Calculation: 422 R Axis:   -3 Text Interpretation:  Sinus rhythm LAE, consider biatrial enlargement Probable left ventricular hypertrophy No significant change was found Confirmed by Jola Schmidt [phone]) on 12/29/2018  4:12:04 PM   Radiology Dg Chest 2 View  Result Date: 12/29/2018 CLINICAL DATA:  Chest pain. EXAM: CHEST - 2 VIEW COMPARISON:  Radiograph May 18, 2014. FINDINGS: The heart size and mediastinal contours are within normal limits. Both lungs are clear. No pneumothorax or pleural effusion is noted. The visualized skeletal structures are unremarkable. IMPRESSION: No active cardiopulmonary disease. Electronically Signed   By: Marijo Conception, M.D.   On: 12/29/2018 15:28   Ct Head Wo Contrast  Result Date:  12/29/2018 CLINICAL DATA:  Headache. EXAM: CT HEAD WITHOUT CONTRAST TECHNIQUE: Contiguous axial images were obtained from the base of the skull through the vertex without intravenous contrast. COMPARISON:  CT head dated July 18, 2009. FINDINGS: Brain: No evidence of acute infarction, hemorrhage, hydrocephalus, extra-axial collection or mass lesion/mass effect. Normal cerebral volume for age. Vascular: No hyperdense vessel or unexpected calcification. Skull: Normal. Negative for fracture or focal lesion. Sinuses/Orbits: No acute finding. Other: None. IMPRESSION: 1. Normal for age noncontrast head CT. Electronically Signed   By: Titus Dubin M.D.   On: 12/29/2018 15:43    Procedures Procedures (including critical care time)  Medications Ordered in ED Medications  morphine 4 MG/ML injection 4 mg (4 mg Intravenous Given 12/29/18 1535)  sodium chloride 0.9 % bolus 1,000 mL (1,000 mLs Intravenous New Bag/Given 12/29/18 1528)     Initial Impression / Assessment and Plan / ED Course  I have reviewed the triage vital signs and the nursing notes.  Pertinent labs & imaging results that were available during my care of the patient were reviewed by me and considered in my medical decision making (see chart for details).        Doubt ACS.  Doubt PE.  Low-grade fever nonspecific chest pain and leg pain.  Patient with myalgias.  May represent influenza.  Influenza testing still pending.  I will  follow-up with this and if it is abnormal he will be called in Tamiflu.  Otherwise well-appearing.  Feels better after treatment here in the emergency department.  No indication for additional work-up or acute hospitalization at this time.  Close primary care follow-up.  Patient understands to return to the emergency department for new or worsening symptoms.  All questions answered.  Final Clinical Impressions(s) / ED Diagnoses   Final diagnoses:  Precordial chest pain  Viral illness    ED Discharge Orders    None       Jola Schmidt, MD 12/29/18 [phone]

## 2018-12-29 NOTE — ED Triage Notes (Signed)
Pt arrives via EMS from home with reports of CP that started Saturday, worsened today. Right sided that radiates to right arm and leg. 324 ASA taken. EMS gave 2 nitro and 4 mg zofran. Pain went from 8 to 2.

## 2018-12-31 ENCOUNTER — Ambulatory Visit (INDEPENDENT_AMBULATORY_CARE_PROVIDER_SITE_OTHER): Payer: Medicare Other | Admitting: Physician Assistant

## 2019-01-09 ENCOUNTER — Ambulatory Visit (INDEPENDENT_AMBULATORY_CARE_PROVIDER_SITE_OTHER): Payer: Medicare Other | Admitting: Orthopaedic Surgery

## 2019-01-09 ENCOUNTER — Other Ambulatory Visit: Payer: Self-pay

## 2019-01-09 ENCOUNTER — Encounter (INDEPENDENT_AMBULATORY_CARE_PROVIDER_SITE_OTHER): Payer: Self-pay | Admitting: Orthopaedic Surgery

## 2019-01-09 ENCOUNTER — Ambulatory Visit (INDEPENDENT_AMBULATORY_CARE_PROVIDER_SITE_OTHER): Payer: Medicare Other

## 2019-01-09 DIAGNOSIS — M79641 Pain in right hand: Secondary | ICD-10-CM

## 2019-01-09 NOTE — Progress Notes (Signed)
Office Visit Note   Patient: Thomas Bowman           Date of Birth: Nov 12, 1950           MRN: [id number] Visit Date: 01/09/2019              Requested by: Medicine, [facility] [address] [place], [place] [address] PCP: Medicine, [facility]   Assessment & Plan: Visit Diagnoses:  1. Pain in right hand     Plan: Impression is right hand pain with questionable carpal tunnel syndrome.  Will refer the patient to Dr. Ernestina Patches for a nerve conduction study.  He will follow-up with [place] once that has been completed.  In the meantime, we did provide him with a removable wrist splint to wear at night.  Follow-Up Instructions: Return in about 4 weeks (around 02/06/2019) for after NCS/EMG.   Orders:  Orders Placed This Encounter  Procedures  . XR Hand Complete Right  . Ambulatory referral to Physical Medicine Rehab   No orders of the defined types were placed in this encounter.     Procedures: No procedures performed   Clinical Data: No additional findings.   Subjective: Chief Complaint  Patient presents with  . Right Hand - Pain    HPI patient is a pleasant 68 year old gentleman who presents our clinic today with right hand/wrist pain and cramping for the past 3-1/2 years.  This began after being arrested where the handcuffs was too tight on the right wrist.  Since then he has had right wrist and palmer pain and cramping.  This is intermittent in nature.  He does note that he has slight weakness and has started to drop things.  There is nothing particular that aggravates his symptoms.  Nothing that seems to make them better.  He does not take medicine for this.  He denies any specific numbness, tingling or burning to the right upper extremity.  Review of Systems as detailed in HPI.  All others reviewed and are negative.   Objective: Vital Signs: There were no vitals taken for this visit.  Physical Exam well-developed well-nourished gentleman in no  acute distress.  Alert and oriented x3.  Ortho Exam examination of the right wrist reveals no tenderness.  Full range of motion without pain.  Negative Phalen.  He does have a positive Tinel at the wrist.  Full sensation distally.  Specialty Comments:  No specialty comments available.  Imaging: Xr Hand Complete Right  Result Date: 01/09/2019 No acute or structural abnormalities    PMFS History: Patient Active Problem List   Diagnosis Date Noted  . Pain in right hand 01/09/2019  . Chronic right shoulder pain 03/26/2018  . Excessive daytime sleepiness 09/13/2014  . OSA (obstructive sleep apnea) 07/27/2014  . Impaired glucose tolerance 05/17/2014  . HTN (hypertension) 05/16/2014  . Chest pain 05/16/2014  . 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 Cheek swelling 06/17/2013  . Acquired flat foot 05/01/2011  . Elevated fasting blood sugar 05/01/2011  . 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 Essential (primary) hypertension 05/01/2011   Past Medical History:  Diagnosis Date  . Bone cancer ([place]) 90s  . Hypertension     Family History  Problem Relation Age of Onset  . Cancer Mother   . Cancer Father   . Hypertension Sister   . Hypertension Brother   .  Heart attack Neg Hx     Past Surgical History:  Procedure Laterality Date  . APPENDECTOMY    . FACIAL NERVE SURGERY Right    Social History   Occupational History  . Not on file  Tobacco Use  . Smoking status: Never Smoker  . Smokeless tobacco: Never Used  Substance and Sexual Activity  . Alcohol use: No    Frequency: Never  . Drug use: No  . Sexual activity: Not on file

## 2019-02-16 DIAGNOSIS — E119 Type 2 diabetes mellitus without complications: Secondary | ICD-10-CM | POA: Insufficient documentation

## 2019-02-24 ENCOUNTER — Encounter: Payer: Self-pay | Admitting: Physical Medicine and Rehabilitation

## 2019-03-19 ENCOUNTER — Encounter (HOSPITAL_COMMUNITY): Payer: Self-pay | Admitting: Emergency Medicine

## 2019-03-19 ENCOUNTER — Emergency Department (HOSPITAL_COMMUNITY): Payer: Medicare Other

## 2019-03-19 ENCOUNTER — Emergency Department (HOSPITAL_COMMUNITY)
Admission: EM | Admit: 2019-03-19 | Discharge: 2019-03-19 | Disposition: A | Discharge status: Home / Self Care | Payer: Medicare Other | Attending: Emergency Medicine | Admitting: Emergency Medicine

## 2019-03-19 ENCOUNTER — Other Ambulatory Visit: Payer: Self-pay

## 2019-03-19 DIAGNOSIS — Y9389 Activity, other specified: Secondary | ICD-10-CM | POA: Insufficient documentation

## 2019-03-19 DIAGNOSIS — Z7982 Long term (current) use of aspirin: Secondary | ICD-10-CM | POA: Diagnosis not present

## 2019-03-19 DIAGNOSIS — I10 Essential (primary) hypertension: Secondary | ICD-10-CM | POA: Diagnosis not present

## 2019-03-19 DIAGNOSIS — S61012A Laceration without foreign body of left thumb without damage to nail, initial encounter: Secondary | ICD-10-CM | POA: Insufficient documentation

## 2019-03-19 DIAGNOSIS — Z79899 Other long term (current) drug therapy: Secondary | ICD-10-CM | POA: Diagnosis not present

## 2019-03-19 DIAGNOSIS — Y929 Unspecified place or not applicable: Secondary | ICD-10-CM | POA: Diagnosis not present

## 2019-03-19 DIAGNOSIS — Z23 Encounter for immunization: Secondary | ICD-10-CM | POA: Diagnosis not present

## 2019-03-19 DIAGNOSIS — Z85818 Personal history of malignant neoplasm of other sites of lip, oral cavity, and pharynx: Secondary | ICD-10-CM | POA: Diagnosis not present

## 2019-03-19 DIAGNOSIS — Y999 Unspecified external cause status: Secondary | ICD-10-CM | POA: Insufficient documentation

## 2019-03-19 DIAGNOSIS — W268XXA Contact with other sharp object(s), not elsewhere classified, initial encounter: Secondary | ICD-10-CM | POA: Insufficient documentation

## 2019-03-19 DIAGNOSIS — W208XXA Other cause of strike by thrown, projected or falling object, initial encounter: Secondary | ICD-10-CM | POA: Insufficient documentation

## 2019-03-19 DIAGNOSIS — Z85830 Personal history of malignant neoplasm of bone: Secondary | ICD-10-CM | POA: Diagnosis not present

## 2019-03-19 MED ORDER — TETANUS-DIPHTH-ACELL PERTUSSIS 5-2.5-18.5 LF-MCG/0.5 IM SUSP
0.5000 mL | Freq: Once | INTRAMUSCULAR | Status: AC
  Administered 2019-03-19: 0.5 mL via INTRAMUSCULAR
  Filled 2019-03-19: qty 0.5

## 2019-03-19 MED ORDER — CEPHALEXIN 500 MG PO CAPS: 500.0000 mg | ORAL_CAPSULE | Freq: Four times a day (QID) | ORAL | 0 refills | Status: DC

## 2019-03-19 MED ORDER — AMLODIPINE BESYLATE 5 MG PO TABS
10.0000 mg | ORAL_TABLET | Freq: Once | ORAL | Status: AC
  Administered 2019-03-19: 10 mg via ORAL
  Filled 2019-03-19: qty 2

## 2019-03-19 MED ORDER — LIDOCAINE HCL (PF) 1 % IJ SOLN
5.0000 mL | Freq: Once | INTRAMUSCULAR | Status: AC
  Administered 2019-03-19: 5 mL
  Filled 2019-03-19: qty 30

## 2019-03-19 NOTE — ED Provider Notes (Signed)
[facility] DEPT Provider Note   CSN: [id number] Arrival date & time: 03/19/19  1426    History   Chief Complaint Chief Complaint  Patient presents with  . Extremity Laceration    HPI Thomas Bowman is a 68 y.o. male with PMHx HTN who presents to the ED complaining of laceration to base of left thumb that occurred earlier today. He reports he was working on a car when a piece of metal fell onto his thumb causing the laceration. Pt unsure about tetanus status. Denies any other associated symptoms.        Past Medical History:  Diagnosis Date  . Bone cancer ([place]) 90s  . Hypertension     Patient Active Problem List   Diagnosis Date Noted  . Pain in right hand 01/09/2019  . Chronic right shoulder pain 03/26/2018  . Excessive daytime sleepiness 09/13/2014  . OSA (obstructive sleep apnea) 07/27/2014  . Impaired glucose tolerance 05/17/2014  . HTN (hypertension) 05/16/2014  . Chest pain 05/16/2014  . 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 Cheek swelling 06/17/2013  . Acquired flat foot 05/01/2011  . Elevated fasting blood sugar 05/01/2011  . 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 Essential (primary) hypertension 05/01/2011    Past Surgical History:  Procedure Laterality Date  . APPENDECTOMY    . FACIAL NERVE SURGERY Right         Home Medications    Prior to Admission medications   Medication Sig Start Date End Date Taking? Authorizing Provider  amLODipine (NORVASC) 10 MG tablet TAKE ONE TABLET BY MOUTH ONCE DAILY 08/22/16   Imogene Burn, PA-C  aspirin 81 MG tablet Take 81 mg by mouth daily.    [provider]  cephALEXin (KEFLEX) 500 MG capsule Take 1 capsule (500 mg total) by mouth 4 (four) times daily. 03/19/19   Alroy Bailiff, Tonye Tancredi, PA-C  cloNIDine (CATAPRES) 0.1 MG tablet Take 1 tablet (0.1 mg total) by  mouth 2 (two) times daily. 05/29/16   Imogene Burn, PA-C  diclofenac sodium (VOLTAREN) 1 % GEL Apply 4 g topically 4 (four) times daily. 06/12/18   Marcial Pacas, MD  DULoxetine (CYMBALTA) 60 MG capsule Take 1 capsule (60 mg total) by mouth daily. 03/26/18   Marcial Pacas, MD  hydrochlorothiazide (HYDRODIURIL) 50 MG tablet Take 1 tablet (50 mg total) by mouth daily. 08/27/16   Imogene Burn, PA-C  ibuprofen (ADVIL,MOTRIN) 800 MG tablet Take 1 tablet (800 mg total) by mouth 3 (three) times daily. 02/02/16   Marella Chimes, PA-C  lidocaine-prilocaine (EMLA) cream Apply 1 application topically as needed. 06/12/18   Marcial Pacas, MD  lisinopril (PRINIVIL,ZESTRIL) 40 MG tablet TAKE ONE TABLET BY MOUTH ONCE DAILY 08/22/16   Imogene Burn, PA-C  Magnesium Oxide 200 MG TABS Take 1 tablet (200 mg total) by mouth daily. 08/27/16   Imogene Burn, PA-C  methylPREDNISolone (MEDROL DOSEPAK) 4 MG TBPK tablet Take as directed 07/31/17   Trula Slade, DPM  ondansetron (ZOFRAN ODT) 8 MG disintegrating tablet Take 1 tablet (8 mg total) by mouth every 8 (eight) hours as needed for nausea or vomiting. 12/29/18   Jola Schmidt, MD  potassium chloride (K-DUR) 10 MEQ tablet Take 1 tablet (10 mEq total) by mouth daily. 08/27/16   Imogene Burn, PA-C  spironolactone (ALDACTONE) 25 MG tablet Take 1 tablet (25 mg total) by mouth  daily. 05/29/16   Imogene Burn, PA-C    Family History Family History  Problem Relation Age of Onset  . Cancer Mother   . Cancer Father   . Hypertension Sister   . Hypertension Brother   . Heart attack Neg Hx     Social History Social History   Tobacco Use  . Smoking status: Never Smoker  . Smokeless tobacco: Never Used  Substance Use Topics  . Alcohol use: No    Frequency: Never  . Drug use: No     Allergies   Hydralazine hcl and Sulfa antibiotics   Review of Systems Review of Systems  Constitutional: Negative for fever.  Musculoskeletal: Negative for arthralgias.   Skin: Positive for wound.     Physical Exam Updated Vital Signs BP (!) 214/111 (BP Location: Right Arm) Comment: pt sts he hasnt took bp meds today  Pulse 67   Temp 98.3 F (36.8 C) (Oral)   Resp 18   Ht 5\' 6"  (1.676 m)   Wt 83.9 kg   SpO2 96%   BMI 29.86 kg/m   Physical Exam Vitals signs and nursing note reviewed.  Constitutional:      Appearance: He is not ill-appearing.  HENT:     Head: Normocephalic and atraumatic.  Eyes:     Conjunctiva/sclera: Conjunctivae normal.  Cardiovascular:     Rate and Rhythm: Normal rate and regular rhythm.     Pulses: Normal pulses.  Pulmonary:     Effort: Pulmonary effort is normal.     Breath sounds: Normal breath sounds. No wheezing, rhonchi or rales.  Musculoskeletal:     Comments: 2 cm laceration to MCP joint of left thumb; bleeding controlled; cap refill < 2 seconds; thumb opposition intact as well as flexion and extension; grip strength 5/5; sensation intact throughout.   Skin:    General: Skin is warm and dry.     Coloration: Skin is not jaundiced.  Neurological:     Mental Status: He is alert.      ED Treatments / Results  Labs (all labs ordered are listed, but only abnormal results are displayed) Labs Reviewed - No data to display  EKG None  Radiology Dg Hand Complete Left  Result Date: 03/19/2019 CLINICAL DATA:  Patient sustained a laceration to the proximal LEFT thumb with a piece of metal earlier today. Initial encounter. EXAM: LEFT HAND - COMPLETE 3+ VIEW COMPARISON:  LEFT thumb x-rays 02/01/2016. No prior LEFT hand imaging. FINDINGS: Laceration at the base of the thumb without evidence of a metallic foreign body in the soft tissues. No evidence of acute fracture or dislocation. Mild narrowing of the IP joints of the thumb and fingers. Severe narrowing of the second MCP joint. Mild narrowing of the third MCP joint. Small opaque foreign bodies in the soft tissues of the dorsum of the hand and in the proximal ring  finger. IMPRESSION: 1. No acute osseous abnormality. 2. Laceration at the base of the thumb without evidence of a metallic foreign body in the soft tissues. 3. Small opaque foreign bodies in the soft tissues of the dorsum of the hand and in the proximal ring finger. 4. 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 Electronically Signed   By: Evangeline Dakin M.D.   On: 03/19/2019 16:17    Procedures .Marland KitchenLaceration Repair Date/Time: 03/19/2019 5:15 PM Performed by: Eustaquio Maize, PA-C Authorized by: Eustaquio Maize, PA-C   Consent:    Consent  obtained:  Verbal   Consent given by:  Patient   Risks discussed:  Infection, pain and poor cosmetic result   Alternatives discussed:  No treatment Anesthesia (see MAR for exact dosages):    Anesthesia method:  Local infiltration   Local anesthetic:  Lidocaine 1% w/o epi Laceration details:    Location:  Finger   Finger location:  L thumb   Length (cm):  2   Depth (mm):  2 Repair type:    Repair type:  Simple Pre-procedure details:    Preparation:  Patient was prepped and draped in usual sterile fashion Exploration:    Hemostasis achieved with:  Direct pressure   Wound exploration: wound explored through full range of motion     Contaminated: no   Treatment:    Area cleansed with:  Betadine   Amount of cleaning:  Standard   Irrigation solution:  Sterile saline   Irrigation volume:  30 CCs   Irrigation method:  Syringe   Visualized foreign bodies/material removed: no   Skin repair:    Repair method:  Sutures   Suture size:  5-0   Suture material:  Nylon   Suture technique:  Simple interrupted   Number of sutures:  4 Approximation:    Approximation:  Close Post-procedure details:    Dressing:  Non-adherent dressing   Patient tolerance of procedure:  Tolerated well, no immediate complications   (including critical care time)  Medications Ordered in ED Medications  Tdap (BOOSTRIX)  injection 0.5 mL (0.5 mLs Intramuscular Given 03/19/19 1622)  lidocaine (PF) (XYLOCAINE) 1 % injection 5 mL (5 mLs Infiltration Given 03/19/19 1621)  amLODipine (NORVASC) tablet 10 mg (10 mg Oral Given 03/19/19 1745)     Initial Impression / Assessment and Plan / ED Course  I have reviewed the triage vital signs and the nursing notes.  Pertinent labs & imaging results that were available during my care of the patient were reviewed by me and considered in my medical decision making (see chart for details).    Pt is a 68 year old male who presents with laceration to left thumb; bleeding controlled. Tetanus up to date. Will obtain xray prior to suture placement as pt reports piece of machinery fell onto hand. Pt also found to be hypertensive at 214/111; reports he has not taken his BP medication in a couple of days as he "does not like medication." When going through pharmacy list pt states he is only taking Amlodipine despite being prescribed amlodipine, lisinopril, and HCTZ. There appears to be some confusion as to what medication patient is supposed to be taking; 10 mg Amlodipine ordered in the ED today with some improvement to 198/106. Pt has allergy to IV hydralazine; have held off for this reason. He has no other complaints besides laceration today; neuro exam unremarkable. Do not feel patient needs further workup as he notes noncompliance with meds.   X ray negative for fracture; sutures placed without issue. Pt discharged home with Rx Keflex for prophylactic measures. Pt advised to have sutures removed in 1 weeks time; he is in agreement with plan and stable for discharge home.        Final Clinical Impressions(s) / ED Diagnoses   Final diagnoses:  Laceration of left thumb without foreign body without damage to nail, initial encounter  Essential hypertension    ED Discharge Orders         Ordered    cephALEXin (KEFLEX) 500 MG capsule  4 times daily  03/19/19 [place], Makhayla Mcmurry, PA-C 03/19/19 2126    Lennice Sites, DO 03/19/19 2133

## 2019-03-19 NOTE — Discharge Instructions (Addendum)
You were seen in the ED today for a laceration to your thumb; you had 4 sutures placed today. Please go to your PCP's office or you may return to the ED in 7 days to have these removed. Please take antibiotic as prescribed. If you begin to notice redness, swelling, drainage of pus, or if you develop fever/chills you need to return to the ED immediately.   Please also follow up with your PCP regarding your blood pressure medications. It seems there is some confusion as to what exactly you are supposed to be taking. It is very important to take these medications regularly.

## 2019-03-19 NOTE — ED Triage Notes (Signed)
Per pt, states he cut his left thumb on a piece of metal-bleeding controlled at this time-1 inch laceration

## 2019-07-01 ENCOUNTER — Encounter (HOSPITAL_COMMUNITY): Payer: Self-pay | Admitting: Emergency Medicine

## 2019-07-01 ENCOUNTER — Other Ambulatory Visit: Payer: Self-pay

## 2019-07-01 DIAGNOSIS — Y999 Unspecified external cause status: Secondary | ICD-10-CM | POA: Diagnosis not present

## 2019-07-01 DIAGNOSIS — R0789 Other chest pain: Secondary | ICD-10-CM | POA: Insufficient documentation

## 2019-07-01 DIAGNOSIS — Z85830 Personal history of malignant neoplasm of bone: Secondary | ICD-10-CM | POA: Diagnosis not present

## 2019-07-01 DIAGNOSIS — I10 Essential (primary) hypertension: Secondary | ICD-10-CM | POA: Diagnosis not present

## 2019-07-01 DIAGNOSIS — M542 Cervicalgia: Secondary | ICD-10-CM | POA: Diagnosis not present

## 2019-07-01 DIAGNOSIS — Z79899 Other long term (current) drug therapy: Secondary | ICD-10-CM | POA: Insufficient documentation

## 2019-07-01 DIAGNOSIS — Y93I9 Activity, other involving external motion: Secondary | ICD-10-CM | POA: Insufficient documentation

## 2019-07-01 DIAGNOSIS — Y92481 Parking lot as the place of occurrence of the external cause: Secondary | ICD-10-CM | POA: Diagnosis not present

## 2019-07-01 DIAGNOSIS — M25511 Pain in right shoulder: Secondary | ICD-10-CM | POA: Insufficient documentation

## 2019-07-01 DIAGNOSIS — Z7982 Long term (current) use of aspirin: Secondary | ICD-10-CM | POA: Diagnosis not present
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
## 2019-07-02 ENCOUNTER — Emergency Department (HOSPITAL_COMMUNITY): Payer: Medicare Other

## 2019-07-02 ENCOUNTER — Emergency Department (HOSPITAL_COMMUNITY)
Admission: EM | Admit: 2019-07-02 | Discharge: 2019-07-02 | Disposition: A | Discharge status: Home / Self Care | Payer: Medicare Other | Attending: Emergency Medicine | Admitting: Emergency Medicine

## 2019-07-02 DIAGNOSIS — S161XXA Strain of muscle, fascia and tendon at neck level, initial encounter: Secondary | ICD-10-CM

## 2019-07-02 DIAGNOSIS — M25511 Pain in right shoulder: Secondary | ICD-10-CM | POA: Diagnosis not present

## 2019-07-02 MED ORDER — METHOCARBAMOL 500 MG PO TABS: 500.0000 mg | ORAL_TABLET | Freq: Two times a day (BID) | ORAL | 0 refills | Status: DC

## 2019-07-02 MED ORDER — AMLODIPINE BESYLATE 5 MG PO TABS: 10.0000 mg | ORAL_TABLET | Freq: Every day | ORAL | Status: DC

## 2019-07-02 NOTE — ED Notes (Signed)
Patient transported to X-ray 

## 2019-07-02 NOTE — Discharge Instructions (Addendum)
You have been prescribed a muscle relaxer to use for pain at night.  Do not use alcohol with this medication.  It will make you drowsy.  Your x-ray shows no fracture.  You likely have a muscular strain. Please return if you have any new or worsening symptoms as discussed.
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
## 2019-07-02 NOTE — ED Notes (Signed)
Pt was verbalized discharge instructions. Pt had no further questions at this time. NAD. 

## 2020-04-28 DIAGNOSIS — H903 Sensorineural hearing loss, bilateral: Secondary | ICD-10-CM | POA: Insufficient documentation

## 2020-04-28 DIAGNOSIS — H6123 Impacted cerumen, bilateral: Secondary | ICD-10-CM | POA: Insufficient documentation

## 2020-04-28 DIAGNOSIS — H9311 Tinnitus, right ear: Secondary | ICD-10-CM | POA: Insufficient documentation

## 2020-08-25 ENCOUNTER — Other Ambulatory Visit: Payer: Self-pay | Admitting: Student

## 2020-08-25 ENCOUNTER — Other Ambulatory Visit: Payer: Self-pay

## 2020-08-25 DIAGNOSIS — M25511 Pain in right shoulder: Secondary | ICD-10-CM

## 2020-09-12 ENCOUNTER — Ambulatory Visit
Admission: RE | Admit: 2020-09-12 | Discharge: 2020-09-12 | Disposition: A | Discharge status: Home / Self Care | Payer: Medicare Other | Source: Ambulatory Visit | Attending: Student | Admitting: Student

## 2020-09-12 DIAGNOSIS — M25511 Pain in right shoulder: Secondary | ICD-10-CM

## 2020-12-12 ENCOUNTER — Other Ambulatory Visit (HOSPITAL_COMMUNITY): Payer: Self-pay | Admitting: Student

## 2020-12-13 ENCOUNTER — Other Ambulatory Visit (HOSPITAL_COMMUNITY): Payer: Self-pay | Admitting: Student

## 2020-12-13 DIAGNOSIS — I10 Essential (primary) hypertension: Secondary | ICD-10-CM

## 2020-12-13 DIAGNOSIS — E785 Hyperlipidemia, unspecified: Secondary | ICD-10-CM

## 2020-12-22 ENCOUNTER — Ambulatory Visit (INDEPENDENT_AMBULATORY_CARE_PROVIDER_SITE_OTHER): Payer: Medicare Other

## 2020-12-22 ENCOUNTER — Ambulatory Visit (INDEPENDENT_AMBULATORY_CARE_PROVIDER_SITE_OTHER): Payer: Medicare Other | Admitting: Orthopedic Surgery

## 2020-12-22 ENCOUNTER — Other Ambulatory Visit: Payer: Self-pay

## 2020-12-22 ENCOUNTER — Encounter: Payer: Self-pay | Admitting: Orthopedic Surgery

## 2020-12-22 DIAGNOSIS — R29898 Other symptoms and signs involving the musculoskeletal system: Secondary | ICD-10-CM

## 2020-12-22 DIAGNOSIS — M25511 Pain in right shoulder: Secondary | ICD-10-CM | POA: Diagnosis not present

## 2020-12-22 NOTE — Progress Notes (Signed)
Office Visit Note   Patient: Thomas Bowman           Date of Birth: 1951-09-12           MRN: [id number] Visit Date: 12/22/2020 Requested by: Medicine, [facility] [address] [place],  [place] [id number] PCP: Medicine, [facility]  Subjective: Chief Complaint  Patient presents with  . Right Shoulder - Pain    HPI: Thomas Bowman is a 70 y.o. male who presents to the office complaining of left shoulder pain.  Patient states that he has had worsening pain that began at the end of 2021 after he picked up luggage that was unexpectedly heavy and felt pain in his shoulder several hours after that incident.  Localizes most of his pain to the superior aspect of the right shoulder with anterior shoulder pain and sometimes lateral shoulder pain.  Denies any significant radiation of pain.  Denies any neck pain aside from some "stiffness".  No numbness or tingling or radicular pain down the arm.  Pain does wake him up throughout the night.  He is not taking any medication for pain.  He has no history of shoulder surgery but he does have history of jaw surgery that required incision through the lateral right chest.  He states his pain is severe and "worse than surgical pain".  Denies any history of dislocation.  He does have history of diabetes that he describes as borderline but denies any thyroid disease..                ROS: All systems reviewed are negative as they relate to the chief complaint within the history of present illness.  Patient denies fevers or chills.  Assessment & Plan: Visit Diagnoses:  1. Acute pain of right shoulder   2. Weakness of right arm     Plan: Patient is a 70 year old male who presents complaining of right shoulder pain.  He has had pain since lifting luggage at about the end of 2021.  He did not have pain immediately or feel a pop but does note that pain became more bothersome several hours later after the incident.  He states pain causes him  to cry at times and wakes him up throughout the night.  He does have some weakness on exam today with infraspinatus testing of the right shoulder.  He has had a CT scan in the past and radiographs today that are negative for any significant findings to explain his symptoms.  Plan to obtain right shoulder MRI arthrogram for further evaluation.  Want to evaluate potential infraspinatus pathology.  Patient agreed with this plan.  Follow-up after MRI to review results.  Follow-Up Instructions: No follow-ups on file.   Orders:  Orders Placed This Encounter  Procedures  . XR Shoulder Right  . MR SHOULDER RIGHT W CONTRAST  . Arthrogram   No orders of the defined types were placed in this encounter.     Procedures: No procedures performed   Clinical Data: No additional findings.  Objective: Vital Signs: There were no vitals taken for this visit.  Physical Exam:  Constitutional: Patient appears well-developed HEENT:  Head: Normocephalic Eyes:EOM are normal Neck: Normal range of motion Cardiovascular: Normal rate Pulmonary/chest: Effort normal Neurologic: Patient is alert Skin: Skin is warm Psychiatric: Patient has normal mood and affect  Ortho Exam: Ortho exam demonstrates left shoulder with 50 degrees external rotation, 100 degrees abduction, 175 degrees forward flexion.  This compared  with the right shoulder with 45 degrees external rotation, 90 degrees abduction, 95 degrees forward flexion.  He has excellent strength of the rotator cuff bilaterally aside from some weakness of the right sided infraspinatus.  No tenderness over the [facility] joint.  He does have tenderness over the bicipital groove of the right shoulder.  No cervical spine tenderness.  Negative Spurling sign.  No weakness of grip strength, finger abduction, pronation/supination, bicep, tricep, deltoid.  Specialty Comments:  No specialty comments available.  Imaging: No results found.   PMFS History: Patient Active  Problem List   Diagnosis Date Noted  . Pain in right hand 01/09/2019  . Chronic right shoulder pain 03/26/2018  . Excessive daytime sleepiness 09/13/2014  . OSA (obstructive sleep apnea) 07/27/2014  . Impaired glucose tolerance 05/17/2014  . HTN (hypertension) 05/16/2014  . Chest pain 05/16/2014  . 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 Cheek swelling 06/17/2013  . Acquired flat foot 05/01/2011  . Elevated fasting blood sugar 05/01/2011  . Cancer of skeletal system ([place]) 05/01/2011  . 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 Essential (primary) hypertension 05/01/2011   Past Medical History:  Diagnosis Date  . Bone cancer ([place]) 90s  . Hypertension     Family History  Problem Relation Age of Onset  . Cancer Mother   . Cancer Father   . Hypertension Sister   . Hypertension Brother   . Heart attack Neg Hx     Past Surgical History:  Procedure Laterality Date  . APPENDECTOMY    . FACIAL NERVE SURGERY Right    Social History   Occupational History  . Not on file  Tobacco Use  . Smoking status: Never Smoker  . Smokeless tobacco: Never Used  Substance and Sexual Activity  . Alcohol use: No  . Drug use: No  . Sexual activity: Not on file

## 2020-12-27 ENCOUNTER — Ambulatory Visit (HOSPITAL_COMMUNITY): Payer: Medicare Other | Attending: Cardiovascular Disease

## 2020-12-27 ENCOUNTER — Other Ambulatory Visit: Payer: Self-pay

## 2020-12-27 DIAGNOSIS — I10 Essential (primary) hypertension: Secondary | ICD-10-CM | POA: Diagnosis not present

## 2020-12-27 DIAGNOSIS — E785 Hyperlipidemia, unspecified: Secondary | ICD-10-CM | POA: Insufficient documentation

## 2020-12-27 LAB — ECHOCARDIOGRAM COMPLETE
Area-P 1/2: 3.51 cm2
S' Lateral: 3.2 cm

## 2021-01-09 ENCOUNTER — Encounter: Payer: Self-pay | Admitting: Radiation Oncology

## 2021-01-09 NOTE — Progress Notes (Addendum)
GU Location of Tumor / Histology: prostatic adenocarcinoma  If Prostate Cancer, Gleason Score is (3 + 4) and PSA is (7.24). Prostate volume: 67 g.  Thomas Bowman presented in April 2020 with a PSA of 9.3 collected on 02/2018 but never scheduled a biopsy.  Biopsies of prostate (if applicable) revealed:   Past/Anticipated interventions by urology, if any: prostate biopsy, referral to Dr. Tammi Klippel to discuss radiation treatment options.  Past/Anticipated interventions by medical oncology, if any: no  Weight changes, if any: no  Bowel/Bladder complaints, if any:  IPSS 3. SHIM 23. Reports having the sensation of not emptying his bladder completely, urinary frequency, and nocturia x 1. Denies dysuria, hematuria, urinary leakage or incontinence.  Nausea/Vomiting, if any: no  Pain issues, if any:  Reports right shoulder pain. Explains he is scheduled for an MRI arthrogram on 01/31/21 to further evaluate the cause of his right shoulder pain.  SAFETY ISSUES:  Prior radiation? Yes at [facility] in 1991 to jawbone  Pacemaker/ICD? denies  Possible current pregnancy? no, male patient  Is the patient on methotrexate? denies  Current Complaints / other details:  70 year old male. Single. I son. Resides in [place].   Reports a hx of bone cancer?

## 2021-01-10 ENCOUNTER — Telehealth: Payer: Self-pay | Admitting: *Deleted

## 2021-01-10 ENCOUNTER — Ambulatory Visit
Admission: RE | Admit: 2021-01-10 | Discharge: 2021-01-10 | Disposition: A | Discharge status: Home / Self Care | Payer: Medicare Other | Source: Ambulatory Visit | Attending: Radiation Oncology | Admitting: Radiation Oncology

## 2021-01-10 ENCOUNTER — Other Ambulatory Visit: Payer: Self-pay

## 2021-01-10 ENCOUNTER — Encounter: Payer: Self-pay | Admitting: Radiation Oncology

## 2021-01-10 DIAGNOSIS — C61 Malignant neoplasm of prostate: Secondary | ICD-10-CM | POA: Diagnosis not present

## 2021-01-10 DIAGNOSIS — Z809 Family history of malignant neoplasm, unspecified: Secondary | ICD-10-CM | POA: Insufficient documentation

## 2021-01-10 DIAGNOSIS — Z7984 Long term (current) use of oral hypoglycemic drugs: Secondary | ICD-10-CM | POA: Diagnosis not present

## 2021-01-10 DIAGNOSIS — R9721 Rising PSA following treatment for malignant neoplasm of prostate: Secondary | ICD-10-CM | POA: Insufficient documentation

## 2021-01-10 DIAGNOSIS — I10 Essential (primary) hypertension: Secondary | ICD-10-CM | POA: Diagnosis not present

## 2021-01-10 DIAGNOSIS — Z7982 Long term (current) use of aspirin: Secondary | ICD-10-CM | POA: Insufficient documentation

## 2021-01-10 DIAGNOSIS — Z79899 Other long term (current) drug therapy: Secondary | ICD-10-CM | POA: Insufficient documentation

## 2021-01-10 HISTORY — DX: Malignant neoplasm of prostate: C61

## 2021-01-10 NOTE — Telephone Encounter (Signed)
CALLED PATIENT TO ASK QUESTIONS, LVM FOR A RETURN CALL 

## 2021-01-10 NOTE — Progress Notes (Signed)
Radiation Oncology         [phone] ________________________________  Initial Outpatient Consultation - Conducted via Telephone due to current COVID-19 concerns for limiting patient exposure  Name: Thomas Bowman MRN: [id number]  Date: 01/10/2021  DOB: 1950-12-15  UV:[id number], [facility]  Janith Lima, MD   REFERRING PHYSICIAN: Janith Lima, MD  DIAGNOSIS: 70 y.o. gentleman with Stage T1c adenocarcinoma of the prostate with Gleason score of 3+4, and PSA of 7.5.    ICD-10-CM   1. Malignant neoplasm of prostate ([place])  C61     HISTORY OF PRESENT ILLNESS: Thomas Bowman is a 70 y.o. male with a diagnosis of prostate cancer. He has a history of elevated PSA and was initially referred to Dr. Alyson Ingles for a PSA of 9.3 in 02/2018. Prostate biopsy was recommended at that time but never scheduled.  He had a repeat PSA on 02/12/2019 that had decreased but remained elevated at 7.34.  More recently, he was noted to have an elevated PSA of 7.5 by his primary care provider, Eston Esters, NP.  Accordingly, he was referred for evaluation in urology by Dr. Abner Greenspan on 10/25/2020,  digital rectal examination was performed at that time revealing no nodules.  The patient proceeded to transrectal ultrasound with 12 biopsies of the prostate on 12/01/2020.  The prostate volume measured 67 cc.  Out of 12 core biopsies, 5 were positive, all on the left.  The maximum Gleason score was 3+4, and this was seen in the left base lateral (with PNI), left mid lateral, left base (with PNI), and left mid. Additionally, Gleason 3+3 was seen in the left apex lateral.  The patient reviewed the biopsy results with his urologist and he has kindly been referred today for discussion of potential radiation treatment options. He states he is also interested in learning more about cryotherapy.  He is adamantly not interested in prostatectomy.  Of note, he is scheduled for right shoulder MRI with potential infraspinatus  aspiration on 01/31/2021.  PREVIOUS RADIATION THERAPY: Yes 1991: [facility] ([facility]) for SNHL  PAST MEDICAL HISTORY:  Past Medical History:  Diagnosis Date  . Bone cancer ([place]) 90s  . Hypertension   . Prostate cancer ([place])       PAST SURGICAL HISTORY: Past Surgical History:  Procedure Laterality Date  . APPENDECTOMY    . FACIAL NERVE SURGERY Right   . PROSTATE BIOPSY      FAMILY HISTORY:  Family History  Problem Relation Age of Onset  . Cancer Mother   . Cancer Father   . Hypertension Sister   . Hypertension Brother   . Heart attack Neg Hx   . Breast cancer Neg Hx   . Prostate cancer Neg Hx   . Colon cancer Neg Hx   . Pancreatic cancer Neg Hx     SOCIAL HISTORY:  Social History   Socioeconomic History  . Marital status: Married    Spouse name: Not on file  . Number of children: Not on file  . Years of education: Not on file  . Highest education level: Not on file  Occupational History  . Not on file  Tobacco Use  . Smoking status: Never Smoker  . Smokeless tobacco: Never Used  Vaping Use  . Vaping Use: Never used  Substance and Sexual Activity  . Alcohol use: No  . Drug use: No  . Sexual activity: Yes  Other Topics Concern  . Not on file  Social History Narrative  .  Not on file   Social Determinants of Health   Financial Resource Strain: Not on file  Food Insecurity: Not on file  Transportation Needs: Not on file  Physical Activity: Not on file  Stress: Not on file  Social Connections: Not on file  Intimate Partner Violence: Not on file    ALLERGIES: Hydralazine hcl and Sulfa antibiotics  MEDICATIONS:  Current Outpatient Medications  Medication Sig Dispense Refill  . amLODipine (NORVASC) 10 MG tablet TAKE ONE TABLET BY MOUTH ONCE DAILY 90 tablet 3  . aspirin 81 MG tablet Take 81 mg by mouth daily.    Marland Kitchen atorvastatin (LIPITOR) 20 MG tablet     . cloNIDine (CATAPRES) 0.1 MG tablet Take 1 tablet (0.1 mg total) by mouth 2 (two) times daily. 60  tablet 3  . diclofenac sodium (VOLTAREN) 1 % GEL Apply 4 g topically 4 (four) times daily. 100 g 11  . DULoxetine (CYMBALTA) 60 MG capsule Take 1 capsule (60 mg total) by mouth daily. 30 capsule 12  . ergocalciferol (VITAMIN D2) 1.25 MG (50000 UT) capsule TAKE 1 CAPSULE BY MOUTH ONCE A WEEK FOR 30 DAYS    . hydrochlorothiazide (HYDRODIURIL) 50 MG tablet Take 1 tablet (50 mg total) by mouth daily. 90 tablet 2  . ibuprofen (ADVIL,MOTRIN) 800 MG tablet Take 1 tablet (800 mg total) by mouth 3 (three) times daily. 21 tablet 0  . lisinopril (PRINIVIL,ZESTRIL) 40 MG tablet TAKE ONE TABLET BY MOUTH ONCE DAILY 90 tablet 3  . Magnesium Oxide 200 MG TABS Take 1 tablet (200 mg total) by mouth daily. 90 tablet 2  . metFORMIN (GLUCOPHAGE) 1000 MG tablet TAKE 1 TABLET BY MOUTH TWICE DAILY WITH MORNING MEAL AND WITH EVENING MEAL FOR 90 DAYS    . methocarbamol (ROBAXIN) 500 MG tablet Take 1 tablet (500 mg total) by mouth 2 (two) times daily. 20 tablet 0  . potassium chloride (K-DUR) 10 MEQ tablet Take 1 tablet (10 mEq total) by mouth daily. 90 tablet 2  . spironolactone (ALDACTONE) 25 MG tablet Take 1 tablet (25 mg total) by mouth daily. 30 tablet 3  . lidocaine-prilocaine (EMLA) cream Apply 1 application topically as needed. (Patient not taking: Reported on 01/10/2021) 30 g 11  . ondansetron (ZOFRAN ODT) 8 MG disintegrating tablet Take 1 tablet (8 mg total) by mouth every 8 (eight) hours as needed for nausea or vomiting. (Patient not taking: Reported on 01/10/2021) 10 tablet 0   No current facility-administered medications for this encounter.    REVIEW OF SYSTEMS:  On review of systems, the patient reports that he is doing well overall. He denies any chest pain, shortness of breath, cough, fevers, chills, night sweats, unintended weight changes. He denies any bowel disturbances, and denies abdominal pain, nausea or vomiting. He denies any new musculoskeletal or joint aches or pains. His IPSS was 3, indicating mild  urinary symptoms. He reports sensation of incomplete bladder emptying, urinary frequency, and nocturia x1. His SHIM was 23, indicating he does not have erectile dysfunction. A complete review of systems is obtained and is otherwise negative.    PHYSICAL EXAM:  Wt Readings from Last 3 Encounters:  03/19/19 185 lb (83.9 kg)  06/12/18 182 lb 8 oz (82.8 kg)  03/26/18 187 lb (84.8 kg)   Temp Readings from Last 3 Encounters:  07/01/19 98.2 F (36.8 C) (Oral)  03/19/19 98.3 F (36.8 C) (Oral)  12/29/18 100 F (37.8 C) (Rectal)   BP Readings from Last 3 Encounters:  07/02/19 Marland Kitchen)  170/92  03/19/19 (!) 202/105  12/29/18 (!) 152/87   Pulse Readings from Last 3 Encounters:  07/02/19 65  03/19/19 67  12/29/18 71   Pain Assessment Pain Score: 5  Pain Frequency: Constant Pain Loc: Shoulder/10  Physical exam not performed in light of telephone consult visit format.   KPS = 90  100 - Normal; no complaints; no evidence of disease. 90   - Able to carry on normal activity; minor signs or symptoms of disease. 80   - Normal activity with effort; some signs or symptoms of disease. 33   - Cares for self; unable to carry on normal activity or to do active work. 60   - Requires occasional assistance, but is able to care for most of his personal needs. 50   - Requires considerable assistance and frequent medical care. 70   - Disabled; requires special care and assistance. 59   - Severely disabled; hospital admission is indicated although death not imminent. 15   - Very sick; hospital admission necessary; active supportive treatment necessary. 10   - Moribund; fatal processes progressing rapidly. 0     - Dead  Karnofsky DA, Abelmann Fieldon, Craver LS and Burchenal [facility] [phone]) The use of the nitrogen mustards in the palliative treatment of carcinoma: with particular reference to bronchogenic carcinoma Cancer 1 634-56  LABORATORY DATA:  Lab Results  Component Value Date   WBC 3.8 (L) 12/29/2018    HGB 14.2 12/29/2018   HCT 44.1 12/29/2018   MCV 84.5 12/29/2018   PLT 130 (L) 12/29/2018   Lab Results  Component Value Date   NA 134 (L) 12/29/2018   K 3.6 12/29/2018   CL 103 12/29/2018   CO2 22 12/29/2018   Lab Results  Component Value Date   ALT 18 12/29/2018   AST 22 12/29/2018   ALKPHOS 63 12/29/2018   BILITOT 0.9 12/29/2018     RADIOGRAPHY: ECHOCARDIOGRAM COMPLETE  Result Date: 12/27/2020    ECHOCARDIOGRAM REPORT   Patient Name:   Thomas Bowman  Date of Exam: 12/27/2020 Medical Rec #:  [id number]     Height:       66.0 in Accession #:    [id number]    Weight:       185.0 lb Date of Birth:  1950/12/08     BSA:          1.935 m Patient Age:    75 years      BP:           140/84 mmHg Patient Gender: M             HR:           55 bpm. Exam Location:  [place] Procedure: 2D Echo, 3D Echo, Cardiac Doppler, Color Doppler and Strain Analysis Indications:    I10 Hypertension  History:        Patient has no prior history of Echocardiogram examinations.                 Signs/Symptoms:Murmur; Risk Factors:Hypertension, Diabetes,                 Dyslipidemia and Sleep Apnea.  Sonographer:    Jessee Avers, RDCS Referring Phys: [id number] [place]  1. Left ventricular ejection fraction, by estimation, is 55 to 60%. Left ventricular ejection fraction by 3D volume is 55 %. The left ventricle has normal function. The left ventricle has no regional wall motion abnormalities. Left ventricular diastolic  parameters are indeterminate. The average left ventricular global longitudinal strain is -22.3 %. The global longitudinal strain is normal.  2. Right ventricular systolic function is normal. The right ventricular size is normal. There is normal pulmonary artery systolic pressure. The estimated right ventricular systolic pressure is 90.3 mmHg.  3. The mitral valve is grossly normal. Trivial mitral valve regurgitation. No evidence of mitral stenosis.  4. The aortic valve is tricuspid. There is  mild calcification of the aortic valve. Aortic valve regurgitation is not visualized. No aortic stenosis is present.  5. The inferior vena cava is normal in size with greater than 50% respiratory variability, suggesting right atrial pressure of 3 mmHg. Conclusion(s)/Recommendation(s): Normal biventricular function without evidence of hemodynamically significant valvular heart disease. FINDINGS  Left Ventricle: Left ventricular ejection fraction, by estimation, is 55 to 60%. Left ventricular ejection fraction by 3D volume is 55 %. The left ventricle has normal function. The left ventricle has no regional wall motion abnormalities. The average left ventricular global longitudinal strain is -22.3 %. The global longitudinal strain is normal. The left ventricular internal cavity size was normal in size. There is no left ventricular hypertrophy. Left ventricular diastolic parameters are indeterminate. Right Ventricle: The right ventricular size is normal. No increase in right ventricular wall thickness. Right ventricular systolic function is normal. There is normal pulmonary artery systolic pressure. The tricuspid regurgitant velocity is 2.40 m/s, and  with an assumed right atrial pressure of 3 mmHg, the estimated right ventricular systolic pressure is 00.9 mmHg. Left Atrium: Left atrial size was normal in size. Right Atrium: Right atrial size was normal in size. Pericardium: Trivial pericardial effusion is present. Presence of pericardial fat pad. Mitral Valve: The mitral valve is grossly normal. Trivial mitral valve regurgitation. No evidence of mitral valve stenosis. Tricuspid Valve: The tricuspid valve is grossly normal. Tricuspid valve regurgitation is trivial. No evidence of tricuspid stenosis. Aortic Valve: The aortic valve is tricuspid. There is mild calcification of the aortic valve. Aortic valve regurgitation is not visualized. No aortic stenosis is present. Pulmonic Valve: The pulmonic valve was grossly normal.  Pulmonic valve regurgitation is trivial. No evidence of pulmonic stenosis. Aorta: The aortic root and ascending aorta are structurally normal, with no evidence of dilitation. Venous: The right upper pulmonary vein is normal. The inferior vena cava is normal in size with greater than 50% respiratory variability, suggesting right atrial pressure of 3 mmHg. IAS/Shunts: The atrial septum is grossly normal.  LEFT VENTRICLE PLAX 2D LVIDd:         4.50 cm         Diastology LVIDs:         3.20 cm         LV e' medial:    5.31 cm/s LV PW:         1.20 cm         LV E/e' medial:  16.0 LV IVS:        1.10 cm         LV e' lateral:   6.50 cm/s LVOT diam:     2.00 cm         LV E/e' lateral: 13.0 LV SV:         77 LV SV Index:   40              2D LVOT Area:     3.14 cm        Longitudinal  Strain                                2D Strain GLS  -17.7 %                                (A2C):                                2D Strain GLS  -25.0 %                                (A3C):                                2D Strain GLS  -24.2 %                                (A4C):                                2D Strain GLS  -22.3 %                                Avg:                                 3D Volume EF                                LV 3D EF:    Left                                             ventricular                                             ejection                                             fraction by                                             3D volume                                             is 55 %.  3D Volume EF:                                3D EF:        55 %                                LV EDV:       108 ml                                LV ESV:       49 ml                                LV SV:        59 ml RIGHT VENTRICLE RV Basal diam:  3.20 cm RV S prime:     11.70 cm/s TAPSE (M-mode): 2.5 cm LEFT ATRIUM             Index       RIGHT ATRIUM            Index LA diam:        4.10 cm 2.12 cm/m  RA Area:     16.70 cm LA Vol (A2C):   50.2 ml 25.95 ml/m RA Volume:   40.60 ml  20.99 ml/m LA Vol (A4C):   59.3 ml 30.65 ml/m LA Biplane Vol: 56.7 ml 29.31 ml/m  AORTIC VALVE LVOT Vmax:   77.70 cm/s LVOT Vmean:  57.400 cm/s LVOT VTI:    0.244 m  AORTA Ao Root diam: 3.20 cm Ao Asc diam:  3.10 cm MITRAL VALVE               TRICUSPID VALVE                            TR Peak grad:   23.0 mmHg                            TR Vmax:        240.00 cm/s MV E velocity: 84.80 cm/s MV A velocity: 85.30 cm/s  SHUNTS MV E/A ratio:  0.99        Systemic VTI:  0.24 m                            Systemic Diam: 2.00 cm Eleonore Chiquito MD Electronically signed by Eleonore Chiquito MD Signature Date/Time: 12/27/2020/11:26:21 AM    Final    XR Shoulder Right  Result Date: 12/22/2020 AP, scapular Y, axillary views of right shoulder reviewed.  Mild degenerative changes of the [facility] joint noted.  No significant degenerative changes of the glenohumeral joint noted.  No fracture or dislocation.  No loss of acromiohumeral interval.  Lung fields are clear.     IMPRESSION/PLAN: This visit was conducted via Telephone to spare the patient unnecessary potential exposure in the healthcare setting during the current COVID-19 pandemic. 1. 70 y.o. gentleman with Stage T1c adenocarcinoma of the prostate with Gleason Score of 3+4, and PSA of 7.5. We discussed the patient's workup and outlined the nature of prostate cancer in this setting. The patient's  T stage, Gleason's score, and PSA put him into the favorable intermediate risk group. Accordingly, he is eligible for a variety of potential treatment options including brachytherapy, 5.5 weeks of external radiation, or prostatectomy. We discussed the available radiation techniques, and focused on the details and logistics of delivery. With a prostate volume of 67 cc, he will need to use a 5-ARI for downsizing the prostate prior to brachytherapy if this is  the treatment option he chooses. We discussed and outlined the risks, benefits, short and long-term effects associated with radiotherapy and compared and contrasted these with prostatectomy. We discussed the role of SpaceOAR in reducing the rectal toxicity associated with radiotherapy. He appears to have a good understanding of his disease and our treatment recommendations which are of curative intent.  He was encouraged to ask questions that were answered to his stated satisfaction.  At the end of the conversation, the patient is leaning towards moving forward with brachytherapy and use of SpaceOAR gel to reduce rectal toxicity from radiotherapy. He would like to go ahead and start the treatment planning process, but he states he will also reach out to a doctor friend of his to discuss his situation and treatment options further. He will call us with any changes to our current plan. In the meantime, we will share our discussion with Dr. Abner Greenspan and move forward with scheduling his [facility] planning appointment in the near future.  The patient will be contacted by Romie Jumper in our office who will be working closely with him to coordinate OR scheduling and pre and post procedure appointments.  We will contact the pharmaceutical rep to ensure that Abe[employer] is available at the time of procedure.  We enjoyed meeting him today and look forward to continuing to participate in his care.  Given current concerns for patient exposure during the COVID-19 pandemic, this encounter was conducted via telephone. The patient was notified in advance and was offered a MyChart meeting to allow for face to face communication but unfortunately reported that he did not have the appropriate resources/technology to support such a visit and instead preferred to proceed with telephone consult. The patient has given verbal consent for this type of encounter. The time spent during this encounter was 60 minutes. The attendants for this  meeting include Tyler Pita, MD, Ashlyn Bruning PA-C, Katie [place], and patient, Thomas Bowman. During the encounter, Tyler Pita MD, Ashlyn Bruning PA-C, and scribe, Wilburn Mylar were located at [place].  Patient, Thomas Bowman was located at home.    Nicholos Johns, PA-C    Tyler Pita, MD  [facility] Direct Dial: [phone]  Fax: [phone] [email or web address]  Skype  LinkedIn   This document serves as a record of services personally performed by Tyler Pita, MD and Freeman Caldron, PA-C. It was created on their behalf by Wilburn Mylar, a trained medical scribe. The creation of this record is based on the scribe's personal observations and the provider's statements to them. This document has been checked and approved by the attending provider.

## 2021-01-12 ENCOUNTER — Telehealth: Payer: Self-pay | Admitting: *Deleted

## 2021-01-12 ENCOUNTER — Other Ambulatory Visit: Payer: Self-pay | Admitting: Urology

## 2021-01-12 DIAGNOSIS — C61 Malignant neoplasm of prostate: Secondary | ICD-10-CM

## 2021-01-12 NOTE — Telephone Encounter (Signed)
Called patient to inform of pre-appts. for 02-16-21 and his implant for 03-24-21, spoke with patient and he is aware of these appts.

## 2021-01-31 ENCOUNTER — Ambulatory Visit
Admission: RE | Admit: 2021-01-31 | Discharge: 2021-01-31 | Disposition: A | Discharge status: Home / Self Care | Payer: Medicare Other | Source: Ambulatory Visit | Attending: Orthopedic Surgery | Admitting: Orthopedic Surgery

## 2021-01-31 ENCOUNTER — Other Ambulatory Visit: Payer: Self-pay

## 2021-01-31 DIAGNOSIS — M25511 Pain in right shoulder: Secondary | ICD-10-CM

## 2021-01-31 MED ORDER — IOPAMIDOL (ISOVUE-M 200) INJECTION 41%
12.0000 mL | Freq: Once | INTRAMUSCULAR | Status: AC
  Administered 2021-01-31: 12 mL via INTRA_ARTICULAR

## 2021-02-10 ENCOUNTER — Telehealth: Payer: Self-pay

## 2021-02-10 NOTE — Telephone Encounter (Signed)
NOTES ON FILE FROM [facility] [phone] SENT REFERRAL TO SCHEDULING 

## 2021-02-15 ENCOUNTER — Telehealth: Payer: Self-pay | Admitting: *Deleted

## 2021-02-15 NOTE — Telephone Encounter (Signed)
XXXXX

## 2021-02-15 NOTE — Telephone Encounter (Signed)
CALLED PATIENT TO REMIND OF PRE-SEED APPTS.FOR 02-16-21, SPOKE WITH PATIENT AND HE IS AWARE OF THESE APPTS.

## 2021-02-16 ENCOUNTER — Ambulatory Visit (HOSPITAL_COMMUNITY)
Admission: RE | Admit: 2021-02-16 | Discharge: 2021-02-16 | Disposition: A | Discharge status: Home / Self Care | Payer: Medicare Other | Source: Ambulatory Visit | Attending: Urology | Admitting: Urology

## 2021-02-16 ENCOUNTER — Encounter (HOSPITAL_COMMUNITY): Admission: RE | Admit: 2021-02-16 | Payer: Medicare Other | Source: Ambulatory Visit

## 2021-02-16 ENCOUNTER — Ambulatory Visit
Admission: RE | Admit: 2021-02-16 | Discharge: 2021-02-16 | Disposition: A | Discharge status: Home / Self Care | Payer: Medicare Other | Source: Ambulatory Visit | Attending: Radiation Oncology | Admitting: Radiation Oncology

## 2021-02-16 ENCOUNTER — Ambulatory Visit
Admission: RE | Admit: 2021-02-16 | Discharge: 2021-02-16 | Disposition: A | Discharge status: Home / Self Care | Payer: Medicare Other | Source: Ambulatory Visit | Attending: Urology | Admitting: Urology

## 2021-02-16 ENCOUNTER — Other Ambulatory Visit: Payer: Self-pay

## 2021-02-16 ENCOUNTER — Encounter (HOSPITAL_COMMUNITY)
Admission: RE | Admit: 2021-02-16 | Discharge: 2021-02-16 | Disposition: A | Discharge status: Home / Self Care | Payer: Medicare Other | Source: Ambulatory Visit | Attending: Urology | Admitting: Urology

## 2021-02-16 DIAGNOSIS — C61 Malignant neoplasm of prostate: Secondary | ICD-10-CM

## 2021-02-16 NOTE — Progress Notes (Signed)
  Radiation Oncology         [phone] ________________________________  Name: Thomas Bowman MRN: [id number]  Date: 02/16/2021  DOB: 08/31/1951  SIMULATION AND TREATMENT PLANNING NOTE PUBIC ARCH STUDY  NU:UVOZDGUY, [facility], [facility]  DIAGNOSIS: 70 y.o. gentleman with Stage T1c adenocarcinoma of the prostate with Gleason score of 3+4, and PSA of 7.5.  Oncology History  Malignant neoplasm of prostate ([place])  12/01/2020 Cancer Staging   Staging form: Prostate, AJCC 8th Edition - Clinical stage from 12/01/2020: Stage IIB (cT1c, cN0, cM0, PSA: 7.5, Grade Group: 2) - Signed by Freeman Caldron, PA-C on 01/10/2021 Histopathologic type: Adenocarcinoma, NOS Stage prefix: Initial diagnosis Prostate specific antigen (PSA) range: Less than 10 Gleason primary pattern: 3 Gleason secondary pattern: 4 Gleason score: 7 Histologic grading system: 5 grade system Number of biopsy cores examined: 12 Number of biopsy cores positive: 5 Location of positive needle core biopsies: One side   01/10/2021 Initial Diagnosis   Malignant neoplasm of prostate ([place])       ICD-10-CM   1. Malignant neoplasm of prostate ([place])  C61     COMPLEX SIMULATION:  The patient presented today for evaluation for possible prostate seed implant. He was brought to the radiation planning suite and placed supine on the CT couch. A 3-dimensional image study set was obtained in upload to the planning computer. There, on each axial slice, I contoured the prostate gland. Then, using three-dimensional radiation planning tools I reconstructed the prostate in view of the structures from the transperineal needle pathway to assess for possible pubic arch interference. In doing so, I did not appreciate any pubic arch interference. Also, the patient's prostate volume was estimated based on the drawn structure. The volume was 67 cc.  Given the pubic arch appearance and prostate volume, patient remains a good  candidate to proceed with prostate seed implant. Today, he freely provided informed written consent to proceed.    PLAN: The patient will undergo prostate seed implant.   ________________________________  Sheral Apley. Tammi Klippel, M.D.

## 2021-03-06 ENCOUNTER — Telehealth: Payer: Self-pay | Admitting: *Deleted

## 2021-03-06 NOTE — Telephone Encounter (Signed)
xxxx 

## 2021-03-06 NOTE — Telephone Encounter (Signed)
RETURNED PATIENT'S PHONE CALL, SPOKE WITH PATIENT. ?

## 2021-03-24 ENCOUNTER — Encounter (HOSPITAL_BASED_OUTPATIENT_CLINIC_OR_DEPARTMENT_OTHER): Admission: RE | Payer: Self-pay | Source: Home / Self Care

## 2021-03-24 ENCOUNTER — Ambulatory Visit (HOSPITAL_BASED_OUTPATIENT_CLINIC_OR_DEPARTMENT_OTHER): Admission: RE | Admit: 2021-03-24 | Payer: Medicare Other | Source: Home / Self Care | Admitting: Urology

## 2021-03-24 SURGERY — INSERTION, RADIATION SOURCE, PROSTATE
Anesthesia: General

## 2021-03-27 ENCOUNTER — Ambulatory Visit (INDEPENDENT_AMBULATORY_CARE_PROVIDER_SITE_OTHER): Payer: Medicare Other | Admitting: Cardiology

## 2021-03-27 ENCOUNTER — Other Ambulatory Visit: Payer: Self-pay

## 2021-03-27 ENCOUNTER — Encounter: Payer: Self-pay | Admitting: Cardiology

## 2021-03-27 VITALS — BP 126/70 | HR 67 | Ht 66.0 in | Wt 172.8 lb

## 2021-03-27 DIAGNOSIS — I10 Essential (primary) hypertension: Secondary | ICD-10-CM | POA: Diagnosis not present

## 2021-03-27 DIAGNOSIS — E7800 Pure hypercholesterolemia, unspecified: Secondary | ICD-10-CM | POA: Diagnosis not present

## 2021-03-27 DIAGNOSIS — R079 Chest pain, unspecified: Secondary | ICD-10-CM | POA: Diagnosis not present

## 2021-03-27 DIAGNOSIS — R011 Cardiac murmur, unspecified: Secondary | ICD-10-CM | POA: Diagnosis not present

## 2021-03-27 DIAGNOSIS — R072 Precordial pain: Secondary | ICD-10-CM

## 2021-03-27 MED ORDER — METOPROLOL TARTRATE 100 MG PO TABS: 100.0000 mg | ORAL_TABLET | Freq: Once | ORAL | 0 refills | Status: DC

## 2021-03-27 NOTE — Addendum Note (Signed)
Addended by: Antonieta Iba on: 03/27/2021 03:04 PM   Modules accepted: Orders

## 2021-03-27 NOTE — Progress Notes (Signed)
Cardiology CONSULT Note    Date:  03/27/2021   ID:  Thomas Bowman, DOB January 07, 1951, MRN [id number]  PCP:  [facility]  Cardiologist:  Fransico Him, MD   Chief Complaint  Patient presents with  . New Patient (Initial Visit)    Chest pain    History of Present Illness:  Thomas Bowman is a 70 y.o. male who is being seen today for the evaluation of chest pain at the request of Cipriano Mile, NP.  This is a 70yo AAM with a hx of DM, HLD, HTN, OSA N(not on CPAP) who was noted to have a heart murmur on PE and 2D echo was done showing normal LVF with trivial MR and mildly thickened AV leaflets with no AS.  He also mentioned that he was having chest pain and is now referred for further evaluation. He tells me that he likes to walk and since he started getting chest pain and DOE he cannot do the activities he used to do.  He says that he has been getting exertional CP and DOE for about 6 months.  It is always when he exerts himself and he will break out in a sweat and get SOB with the chest pain.  It can last up to 30 minutes and has to sit down for it to pass.  There is no radiation of the pain.  He denies any palpitations, LE edema or syncope but does get dizzy from time to time.     Past Medical History:  Diagnosis Date  . Bone cancer ([place]) 90s  . DM (diabetes mellitus) ([place])   . History of bone cancer   . HLD (hyperlipidemia)   . Hypertension   . Leg pain   . Muscle strain of shoulder region   . OSA (obstructive sleep apnea)   . Overweight   . Prostate cancer ([place])   . PSA elevation   . Shoulder pain     Past Surgical History:  Procedure Laterality Date  . APPENDECTOMY    . FACIAL NERVE SURGERY Right   . PROSTATE BIOPSY      Current Medications: Current Meds  Medication Sig  . amLODipine (NORVASC) 10 MG tablet TAKE ONE TABLET BY MOUTH ONCE DAILY  . aspirin 81 MG tablet Take 81 mg by mouth daily.  Marland Kitchen atorvastatin (LIPITOR) 20 MG tablet   . cloNIDine  (CATAPRES) 0.1 MG tablet Take 1 tablet (0.1 mg total) by mouth 2 (two) times daily.  . clotrimazole (LOTRIMIN) 1 % cream Apply 1 application topically 2 (two) times daily.  . cyclobenzaprine (FLEXERIL) 10 MG tablet Take 10 mg by mouth 3 (three) times daily as needed for muscle spasms.  . diclofenac sodium (VOLTAREN) 1 % GEL Apply 4 g topically 4 (four) times daily.  . DULoxetine (CYMBALTA) 60 MG capsule Take 1 capsule (60 mg total) by mouth daily.  . empagliflozin (JARDIANCE) 10 MG TABS tablet Take by mouth daily.  . ergocalciferol (VITAMIN D2) 1.25 MG (50000 UT) capsule TAKE 1 CAPSULE BY MOUTH ONCE A WEEK FOR 30 DAYS  . glucose blood (TRUE METRIX PRO BLOOD GLUCOSE) test strip 1 each by Other route as needed for other. Use as instructed  . hydrochlorothiazide (HYDRODIURIL) 50 MG tablet Take 1 tablet (50 mg total) by mouth daily.  Marland Kitchen ibuprofen (ADVIL,MOTRIN) 800 MG tablet Take 1 tablet (800 mg total) by mouth 3 (three) times daily.  . Lancets Thin MISC by Does not apply route. 23 gauge  .  lidocaine-prilocaine (EMLA) cream Apply 1 application topically as needed.  Marland Kitchen lisinopril (PRINIVIL,ZESTRIL) 40 MG tablet TAKE ONE TABLET BY MOUTH ONCE DAILY  . Magnesium Oxide 200 MG TABS Take 1 tablet (200 mg total) by mouth daily.  . metFORMIN (GLUCOPHAGE) 1000 MG tablet TAKE 1 TABLET BY MOUTH TWICE DAILY WITH MORNING MEAL AND WITH EVENING MEAL FOR 90 DAYS  . methocarbamol (ROBAXIN) 500 MG tablet Take 1 tablet (500 mg total) by mouth 2 (two) times daily.  . ondansetron (ZOFRAN ODT) 8 MG disintegrating tablet Take 1 tablet (8 mg total) by mouth every 8 (eight) hours as needed for nausea or vomiting.  . pantoprazole (PROTONIX) 20 MG tablet Take 20 mg by mouth daily.  . potassium chloride (K-DUR) 10 MEQ tablet Take 1 tablet (10 mEq total) by mouth daily.  Marland Kitchen spironolactone (ALDACTONE) 25 MG tablet Take 1 tablet (25 mg total) by mouth daily.    Allergies:   Hydralazine hcl and Sulfa antibiotics   Social History    Socioeconomic History  . Marital status: Single    Spouse name: Not on file  . Number of children: Not on file  . Years of education: Not on file  . Highest education level: Not on file  Occupational History  . Not on file  Tobacco Use  . Smoking status: Never Smoker  . Smokeless tobacco: Never Used  Vaping Use  . Vaping Use: Never used  Substance and Sexual Activity  . Alcohol use: No  . Drug use: No  . Sexual activity: Yes  Other Topics Concern  . Not on file  Social History Narrative  . Not on file   Social Determinants of Health   Financial Resource Strain: Not on file  Food Insecurity: Not on file  Transportation Needs: Not on file  Physical Activity: Not on file  Stress: Not on file  Social Connections: Not on file     Family History:  The patient's family history includes Cancer in his father and mother; Hypertension in his brother and sister.   ROS:   Please see the history of present illness.    ROS All other systems reviewed and are negative.  No flowsheet data found.     PHYSICAL EXAM:   VS:  BP 126/70   Pulse 67   Ht 5\' 6"  (1.676 m)   Wt 172 lb 12.8 oz (78.4 kg)   SpO2 99%   BMI 27.89 kg/m    GEN: Well nourished, well developed, in no acute distress  HEENT: normal  Neck: no JVD, carotid bruits, or masses Cardiac: RRR; no murmurs, rubs, or gallops,no edema.  Intact distal pulses bilaterally.  Respiratory:  clear to auscultation bilaterally, normal work of breathing GI: soft, nontender, nondistended, + BS MS: no deformity or atrophy  Skin: warm and dry, no rash Neuro:  Alert and Oriented x 3, Strength and sensation are intact Psych: euthymic mood, full affect  Wt Readings from Last 3 Encounters:  03/27/21 172 lb 12.8 oz (78.4 kg)  03/19/19 185 lb (83.9 kg)  06/12/18 182 lb 8 oz (82.8 kg)      Studies/Labs Reviewed:   EKG:  EKG is not ordered today.    Recent Labs: No results found for requested labs within last 8760 hours.    Lipid Panel    Component Value Date/Time   CHOL 181 11/29/2015 1425   TRIG 135 11/29/2015 1425   HDL 47 11/29/2015 1425   CHOLHDL 3.9 11/29/2015 1425   VLDL 27 11/29/2015  [place] 11/29/2015 1425   Additional studies/ records that were reviewed today include:  OV notes from PCP, 2D echo ordered by PCP    ASSESSMENT:    1. Chest pain of uncertain etiology   2. Heart murmur   3. Primary hypertension   4. Pure hypercholesterolemia      PLAN:  In order of problems listed above:  1. Chest pain -His CP is exertional with SOB and diaphoresis and worrisome for angina.  -he has multiple CRFs including HLD, HTN, DM -2D echo from ordered by PCP showed normal LVF with no valvular heart disease -I have personally reviewed and interpreted outside EKG performed by patient's PCP which showed NSR with no ST changes on 02/16/2021 -I will get a coronary CTA to define coronary anatomy -he will need to hold his metformin for 48 hours prior to and 48 hours after his CT  2,.  Heart murmur -I have personally reviewed and interpreted outside 2D echo  performed by patient's PCP which showed normal LVF with mild AVSC.  3.  HTN -BP is controlled on exam today -Continue prescription drug management with amlodipine10mg  daily, clonidine 0.1mg  BID, Lisinopril 40mg  daily, HCTZ 50mg  daily and spiro 25mg  daily  4.  HLD -LDL goal < 100 -I have personally reviewed and interpreted outside labs performed by patient's PCP which showed LDL138, HDL 49, TAG 99 and ALT 15 from Sept 2021 -continue Atorvastatin 20mg  daily  Time Spent: 25 minutes total time of encounter, including 15 minutes spent in face-to-face patient care on the date of this encounter. This time includes coordination of care and counseling regarding above mentioned problem list. Remainder of non-face-to-face time involved reviewing chart documents/testing relevant to the patient encounter and documentation in the medical record. I  have independently reviewed documentation from referring provider  Medication Adjustments/Labs and Tests Ordered: Current medicines are reviewed at length with the patient today.  Concerns regarding medicines are outlined above.  Medication changes, Labs and Tests ordered today are listed in the Patient Instructions below.  There are no Patient Instructions on file for this visit.   Signed, Fransico Him, MD  03/27/2021 2:51 PM    [facility] [place], [place], [place]  [address] Phone: [phone]; Fax: [phone]

## 2021-03-27 NOTE — Patient Instructions (Addendum)
Medication Instructions:  Your physician recommends that you continue on your current medications as directed. Please refer to the Current Medication list given to you today.  *If you need a refill on your cardiac medications before your next appointment, please call your pharmacy*    Testing/Procedures: Your provider has recommended that you have a coronary CTA scan. Please see below for further instructions    Follow-Up: At [facility], you and your health needs are our priority.  As part of our continuing mission to provide you with exceptional heart care, we have created designated Provider Care Teams.  These Care Teams include your primary Cardiologist (physician) and Advanced Practice Providers (APPs -  Physician Assistants and Nurse Practitioners) who all work together to provide you with the care you need, when you need it.  Follow up with Dr. Radford Pax as needed based on results of testing.    Other Instructions Your cardiac CT will be scheduled at:   [facility] [address] [place], [place] [address] [phone]  Please arrive at the [facility] main entrance (entrance A) of [facility] 30 minutes prior to test start time. Proceed to the [facility] Radiology Department (first floor) to check-in and test prep.   Please follow these instructions carefully (unless otherwise directed):  Hold all erectile dysfunction medications at least 3 days (72 hrs) prior to test.  On the Night Before the Test: . Be sure to Drink plenty of water. . Do not consume any caffeinated/decaffeinated beverages or chocolate 12 hours prior to your test. . Do not take any antihistamines 12 hours prior to your test.  On the Day of the Test: . Drink plenty of water until 1 hour prior to the test. . Do not eat any food 4 hours prior to the test. . You may take your regular medications prior to the test.  . Take metoprolol (Lopressor) two hours prior to test. . HOLD  Hydrochlorothiazide morning of the test.       After the Test: . Drink plenty of water. . After receiving IV contrast, you may experience a mild flushed feeling. This is normal. . On occasion, you may experience a mild rash up to 24 hours after the test. This is not dangerous. If this occurs, you can take Benadryl 25 mg and increase your fluid intake. . If you experience trouble breathing, this can be serious. If it is severe call 911 IMMEDIATELY. If it is mild, please call our office. . If you take any of these medications: Glipizide/Metformin, Avandament, Glucavance, please do not take 48 hours prior to testing and 48 hours after completing test.   Once we have confirmed authorization from your insurance company, we will call you to set up a date and time for your test. Based on how quickly your insurance processes prior authorizations requests, please allow up to 4 weeks to be contacted for scheduling your Cardiac CT appointment. Be advised that routine Cardiac CT appointments could be scheduled as many as 8 weeks after your provider has ordered it.  For non-scheduling related questions, please contact the cardiac imaging nurse navigator should you have any questions/concerns: Marchia Bond, Cardiac Imaging Nurse Navigator Gordy Clement, Cardiac Imaging Nurse Navigator [facility] Direct Office Dial: [phone]   For scheduling needs, including cancellations and rescheduling, please call [place], [phone].

## 2021-03-28 ENCOUNTER — Encounter (INDEPENDENT_AMBULATORY_CARE_PROVIDER_SITE_OTHER): Payer: Medicare Other | Admitting: Ophthalmology

## 2021-03-30 ENCOUNTER — Other Ambulatory Visit: Payer: Medicare Other | Admitting: *Deleted

## 2021-03-30 ENCOUNTER — Other Ambulatory Visit: Payer: Self-pay

## 2021-03-30 DIAGNOSIS — I10 Essential (primary) hypertension: Secondary | ICD-10-CM

## 2021-03-30 DIAGNOSIS — R079 Chest pain, unspecified: Secondary | ICD-10-CM

## 2021-03-30 LAB — BASIC METABOLIC PANEL
BUN/Creatinine Ratio: 15 (ref 10–24)
BUN: 18 mg/dL (ref 8–27)
CO2: 20 mmol/L (ref 20–29)
Calcium: 9.3 mg/dL (ref 8.6–10.2)
Chloride: 101 mmol/L (ref 96–106)
Creatinine, Ser: 1.21 mg/dL (ref 0.76–1.27)
Glucose: 160 mg/dL — ABNORMAL HIGH (ref 65–99)
Potassium: 4.4 mmol/L (ref 3.5–5.2)
Sodium: 136 mmol/L (ref 134–144)
eGFR: 65 mL/min/{1.73_m2} (ref >59)

## 2021-04-12 ENCOUNTER — Telehealth (HOSPITAL_COMMUNITY): Payer: Self-pay | Admitting: Emergency Medicine

## 2021-04-12 DIAGNOSIS — R079 Chest pain, unspecified: Secondary | ICD-10-CM

## 2021-04-12 MED ORDER — METOPROLOL TARTRATE 100 MG PO TABS: 100.0000 mg | ORAL_TABLET | Freq: Once | ORAL | 0 refills | Status: DC

## 2021-04-12 NOTE — Telephone Encounter (Signed)
Reaching out to patient to offer assistance regarding upcoming cardiac imaging study; pt verbalizes understanding of appt date/time, parking situation and where to check in, pre-test NPO status and medications ordered, and verified current allergies; name and call back number provided for further questions should they arise Marchia Bond RN Navigator Cardiac [facility] [place] [facility] [phone] office [phone] cell  Pt reports he stopped taking all medications this week in preparation for CCTA on Friday. I cautioned him about this since he takes many different medications.   He said he didn't have metoprolol tartrate 100mg  so I represcribed this medication and [facility]   Clarise Cruz

## 2021-04-14 ENCOUNTER — Ambulatory Visit (HOSPITAL_COMMUNITY)
Admission: RE | Admit: 2021-04-14 | Discharge: 2021-04-14 | Disposition: A | Discharge status: Home / Self Care | Payer: Medicare Other | Source: Ambulatory Visit | Attending: Cardiology | Admitting: Cardiology

## 2021-04-14 ENCOUNTER — Other Ambulatory Visit: Payer: Self-pay

## 2021-04-14 ENCOUNTER — Telehealth: Payer: Self-pay

## 2021-04-14 ENCOUNTER — Ambulatory Visit (HOSPITAL_COMMUNITY)
Admission: RE | Admit: 2021-04-14 | Discharge: 2021-04-14 | Disposition: A | Discharge status: Home / Self Care | Payer: Medicare Other | Source: Ambulatory Visit | Attending: Internal Medicine | Admitting: Internal Medicine

## 2021-04-14 ENCOUNTER — Other Ambulatory Visit: Payer: Self-pay | Admitting: Internal Medicine

## 2021-04-14 DIAGNOSIS — R072 Precordial pain: Secondary | ICD-10-CM | POA: Diagnosis present

## 2021-04-14 DIAGNOSIS — R079 Chest pain, unspecified: Secondary | ICD-10-CM

## 2021-04-14 DIAGNOSIS — R931 Abnormal findings on diagnostic imaging of heart and coronary circulation: Secondary | ICD-10-CM

## 2021-04-14 DIAGNOSIS — I2510 Atherosclerotic heart disease of native coronary artery without angina pectoris: Secondary | ICD-10-CM | POA: Diagnosis not present

## 2021-04-14 DIAGNOSIS — I700 Atherosclerosis of aorta: Secondary | ICD-10-CM | POA: Diagnosis not present

## 2021-04-14 MED ORDER — NITROGLYCERIN 0.4 MG SL SUBL: 0.8000 mg | SUBLINGUAL_TABLET | Freq: Once | SUBLINGUAL | Status: AC

## 2021-04-14 MED ORDER — NITROGLYCERIN 0.4 MG SL SUBL
SUBLINGUAL_TABLET | SUBLINGUAL | Status: AC
  Administered 2021-04-14: 0.8 mg via SUBLINGUAL
  Filled 2021-04-14: qty 2

## 2021-04-14 MED ORDER — IOHEXOL 350 MG/ML SOLN
100.0000 mL | Freq: Once | INTRAVENOUS | Status: AC | PRN
  Administered 2021-04-14: 100 mL via INTRAVENOUS

## 2021-04-14 NOTE — Telephone Encounter (Signed)
Cardiac catheterization has been scheduled for 6/28 with Dr. Tamala Julian. Patient is coming by on Monday 6/27 for lab work. I will review instructions with him at that time. Patient is aware to report to the ER over the weekend if he develops any chest pain.

## 2021-04-14 NOTE — H&P (View-Only) (Signed)
CT FFR will be submitted, see separate report for results

## 2021-04-14 NOTE — Progress Notes (Signed)
CT FFR will be submitted, see separate report for results

## 2021-04-14 NOTE — Telephone Encounter (Signed)
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  I have recommended proceeding with LHC with possible PCI early next week.  Please get patient set up for cardiac cath early next week.  Patient was instructed to continue on ASA and statin and to go to ER if he develops pain over the weekend.   Shared Decision Making/Informed Consent The risks [stroke (1 in 1000), death (1 in 1000), kidney failure [usually temporary] (1 in 500), bleeding (1 in 200), allergic reaction [possibly serious] (1 in 200)], benefits (diagnostic support and management of coronary artery disease) and alternatives of a cardiac catheterization were discussed in detail with Thomas Bowman and he is willing to proceed.

## 2021-04-17 ENCOUNTER — Other Ambulatory Visit: Payer: Self-pay

## 2021-04-17 ENCOUNTER — Other Ambulatory Visit: Payer: Medicare Other

## 2021-04-17 ENCOUNTER — Other Ambulatory Visit: Payer: Self-pay | Admitting: *Deleted

## 2021-04-17 ENCOUNTER — Telehealth: Payer: Self-pay | Admitting: *Deleted

## 2021-04-17 ENCOUNTER — Other Ambulatory Visit: Payer: Medicare Other | Admitting: *Deleted

## 2021-04-17 DIAGNOSIS — R931 Abnormal findings on diagnostic imaging of heart and coronary circulation: Secondary | ICD-10-CM

## 2021-04-17 DIAGNOSIS — Z01812 Encounter for preprocedural laboratory examination: Secondary | ICD-10-CM

## 2021-04-17 DIAGNOSIS — I2510 Atherosclerotic heart disease of native coronary artery without angina pectoris: Secondary | ICD-10-CM

## 2021-04-17 LAB — BASIC METABOLIC PANEL
BUN/Creatinine Ratio: 14 (ref 10–24)
BUN: 14 mg/dL (ref 8–27)
CO2: 29 mmol/L (ref 20–29)
Calcium: 9.2 mg/dL (ref 8.6–10.2)
Chloride: 101 mmol/L (ref 96–106)
Creatinine, Ser: 0.99 mg/dL (ref 0.76–1.27)
Glucose: 217 mg/dL — ABNORMAL HIGH (ref 65–99)
Potassium: 4.3 mmol/L (ref 3.5–5.2)
Sodium: 137 mmol/L (ref 134–144)
eGFR: 82 mL/min/{1.73_m2} (ref >59)

## 2021-04-17 LAB — CBC
Hematocrit: 42.1 % (ref 37.5–51.0)
Hemoglobin: 14.3 g/dL (ref 13.0–17.7)
MCH: 29.1 pg (ref 26.6–33.0)
MCHC: 34 g/dL (ref 31.5–35.7)
MCV: 86 fL (ref 79–97)
Platelets: 178 10*3/uL (ref 150–450)
RBC: 4.91 x10E6/uL (ref 4.14–5.80)
RDW: 14.3 % (ref 11.6–15.4)
WBC: 4.3 10*3/uL (ref 3.4–10.8)

## 2021-04-17 NOTE — Telephone Encounter (Addendum)
Pt contacted pre-catheterization scheduled at [facility] for: Tuesday April 18, 2021 12 Noon Verified arrival time and place: [facility]) at: 10 AM   No solid food after midnight prior to cath, clear liquids until 5 AM day of procedure.  Hold: HCTZ/KCl-AM of procedure Spironolactone-AM of procedure Metformin-day of procedure and 48 hours post procedure Jardiance-AM of procedure  Except hold medications AM meds can be  taken pre-cath with sips of water including: Aspirin 81 mg   Confirmed patient has responsible adult to drive home post procedure and be with patient first 24 hours after arriving home: yes  You are allowed ONE visitor in the waiting room during the time you are at the hospital for your procedure. Both you and your visitor must wear a mask once you enter the hospital.   Patient reports does not currently have any symptoms concerning for COVID-19 and no household members with COVID-19 like illness.    Reviewed procedure/mask/visitor instructions with patient.

## 2021-04-17 NOTE — H&P (Signed)
CP and DOE Abnormal CTA coronary. FFR okay

## 2021-04-18 ENCOUNTER — Other Ambulatory Visit: Payer: Self-pay

## 2021-04-18 ENCOUNTER — Ambulatory Visit (HOSPITAL_COMMUNITY)
Admission: RE | Admit: 2021-04-18 | Discharge: 2021-04-18 | Disposition: A | Discharge status: Home / Self Care | Payer: Medicare Other | Attending: Interventional Cardiology | Admitting: Interventional Cardiology

## 2021-04-18 ENCOUNTER — Encounter (HOSPITAL_COMMUNITY)
Admission: RE | Disposition: A | Discharge status: Home / Self Care | Payer: Self-pay | Source: Home / Self Care | Attending: Interventional Cardiology

## 2021-04-18 DIAGNOSIS — I209 Angina pectoris, unspecified: Secondary | ICD-10-CM | POA: Diagnosis present

## 2021-04-18 DIAGNOSIS — G4733 Obstructive sleep apnea (adult) (pediatric): Secondary | ICD-10-CM | POA: Diagnosis present

## 2021-04-18 DIAGNOSIS — I2510 Atherosclerotic heart disease of native coronary artery without angina pectoris: Secondary | ICD-10-CM

## 2021-04-18 DIAGNOSIS — I10 Essential (primary) hypertension: Secondary | ICD-10-CM | POA: Diagnosis present

## 2021-04-18 DIAGNOSIS — I25119 Atherosclerotic heart disease of native coronary artery with unspecified angina pectoris: Secondary | ICD-10-CM | POA: Diagnosis not present

## 2021-04-18 HISTORY — PX: LEFT HEART CATH AND CORONARY ANGIOGRAPHY: CATH118249

## 2021-04-18 HISTORY — PX: CORONARY PRESSURE/FFR STUDY: CATH118243

## 2021-04-18 LAB — GLUCOSE, CAPILLARY
Glucose-Capillary: 182 mg/dL — ABNORMAL HIGH (ref 70–99)
Glucose-Capillary: 135 mg/dL — ABNORMAL HIGH (ref 70–99)

## 2021-04-18 LAB — POCT ACTIVATED CLOTTING TIME: Activated Clotting Time: 306 seconds

## 2021-04-18 SURGERY — LEFT HEART CATH AND CORONARY ANGIOGRAPHY
Anesthesia: LOCAL

## 2021-04-18 MED ORDER — FENTANYL CITRATE (PF) 100 MCG/2ML IJ SOLN
INTRAMUSCULAR | Status: AC
  Filled 2021-04-18: qty 2

## 2021-04-18 MED ORDER — LIDOCAINE HCL (PF) 1 % IJ SOLN
INTRAMUSCULAR | Status: DC | PRN
  Administered 2021-04-18: 2 mL

## 2021-04-18 MED ORDER — LABETALOL HCL 5 MG/ML IV SOLN: 10.0000 mg | INTRAVENOUS | Status: DC | PRN

## 2021-04-18 MED ORDER — SODIUM CHLORIDE 0.9% FLUSH: 3.0000 mL | INTRAVENOUS | Status: DC | PRN

## 2021-04-18 MED ORDER — SODIUM CHLORIDE 0.9 % WEIGHT BASED INFUSION: 1.0000 mL/kg/h | INTRAVENOUS | Status: DC

## 2021-04-18 MED ORDER — HEPARIN SODIUM (PORCINE) 1000 UNIT/ML IJ SOLN
INTRAMUSCULAR | Status: AC
  Filled 2021-04-18: qty 1

## 2021-04-18 MED ORDER — HEPARIN SODIUM (PORCINE) 1000 UNIT/ML IJ SOLN
INTRAMUSCULAR | Status: DC | PRN
  Administered 2021-04-18 (×2): 4500 [IU] via INTRAVENOUS

## 2021-04-18 MED ORDER — MIDAZOLAM HCL 2 MG/2ML IJ SOLN
INTRAMUSCULAR | Status: AC
  Filled 2021-04-18: qty 2

## 2021-04-18 MED ORDER — ADENOSINE 12 MG/4ML IV SOLN
INTRAVENOUS | Status: AC
  Filled 2021-04-18: qty 4

## 2021-04-18 MED ORDER — SODIUM CHLORIDE 0.9 % WEIGHT BASED INFUSION
3.0000 mL/kg/h | INTRAVENOUS | Status: AC
  Administered 2021-04-18: 3 mL/kg/h via INTRAVENOUS

## 2021-04-18 MED ORDER — HEPARIN (PORCINE) IN NACL 1000-0.9 UT/500ML-% IV SOLN
INTRAVENOUS | Status: DC | PRN
  Administered 2021-04-18 (×2): 500 mL

## 2021-04-18 MED ORDER — ADENOSINE (DIAGNOSTIC) 140MCG/KG/MIN
INTRAVENOUS | Status: DC | PRN
  Administered 2021-04-18: 140 ug/kg/min via INTRAVENOUS

## 2021-04-18 MED ORDER — ONDANSETRON HCL 4 MG/2ML IJ SOLN: 4.0000 mg | Freq: Four times a day (QID) | INTRAMUSCULAR | Status: DC | PRN

## 2021-04-18 MED ORDER — LIDOCAINE HCL (PF) 1 % IJ SOLN
INTRAMUSCULAR | Status: AC
  Filled 2021-04-18: qty 30

## 2021-04-18 MED ORDER — SODIUM CHLORIDE 0.9 % IV SOLN: 250.0000 mL | INTRAVENOUS | Status: DC | PRN

## 2021-04-18 MED ORDER — SODIUM CHLORIDE 0.9% FLUSH: 3.0000 mL | Freq: Two times a day (BID) | INTRAVENOUS | Status: DC

## 2021-04-18 MED ORDER — VERAPAMIL HCL 2.5 MG/ML IV SOLN
INTRAVENOUS | Status: DC | PRN
  Administered 2021-04-18: 10 mL via INTRA_ARTERIAL

## 2021-04-18 MED ORDER — IOHEXOL 350 MG/ML SOLN
INTRAVENOUS | Status: DC | PRN
  Administered 2021-04-18: 45 mL

## 2021-04-18 MED ORDER — SODIUM CHLORIDE 0.9 % IV SOLN: INTRAVENOUS | Status: DC

## 2021-04-18 MED ORDER — ACETAMINOPHEN 325 MG PO TABS: 650.0000 mg | ORAL_TABLET | ORAL | Status: DC | PRN

## 2021-04-18 MED ORDER — MIDAZOLAM HCL 2 MG/2ML IJ SOLN
INTRAMUSCULAR | Status: DC | PRN
  Administered 2021-04-18 (×2): 1 mg via INTRAVENOUS

## 2021-04-18 MED ORDER — HEPARIN (PORCINE) IN NACL 1000-0.9 UT/500ML-% IV SOLN
INTRAVENOUS | Status: AC
  Filled 2021-04-18: qty 1000

## 2021-04-18 MED ORDER — FENTANYL CITRATE (PF) 100 MCG/2ML IJ SOLN
INTRAMUSCULAR | Status: DC | PRN
  Administered 2021-04-18: 25 ug via INTRAVENOUS

## 2021-04-18 MED ORDER — HYDRALAZINE HCL 20 MG/ML IJ SOLN: 10.0000 mg | INTRAMUSCULAR | Status: DC | PRN

## 2021-04-18 MED ORDER — OXYCODONE HCL 5 MG PO TABS: 5.0000 mg | ORAL_TABLET | ORAL | Status: DC | PRN

## 2021-04-18 MED ORDER — ASPIRIN 81 MG PO CHEW: 81.0000 mg | CHEWABLE_TABLET | Freq: Every day | ORAL | Status: DC

## 2021-04-18 MED ORDER — VERAPAMIL HCL 2.5 MG/ML IV SOLN
INTRAVENOUS | Status: AC
  Filled 2021-04-18: qty 2

## 2021-04-18 MED ORDER — ASPIRIN 81 MG PO CHEW: 81.0000 mg | CHEWABLE_TABLET | ORAL | Status: DC

## 2021-04-18 SURGICAL SUPPLY — 13 items
CATH 5FR JL3.5 JR4 ANG PIG MP (CATHETERS) ×1 IMPLANT
CATH LAUNCHER 6FR EBU3.5 (CATHETERS) ×1 IMPLANT
DEVICE RAD COMP TR BAND LRG (VASCULAR PRODUCTS) ×1 IMPLANT
GLIDESHEATH SLEND A-KIT 6F 22G (SHEATH) ×1 IMPLANT
GUIDEWIRE INQWIRE 1.5J.035X260 (WIRE) IMPLANT
GUIDEWIRE PRESSURE X 175 (WIRE) ×1 IMPLANT
INQWIRE 1.5J .035X260CM (WIRE) ×2
KIT ESSENTIALS PG (KITS) ×1 IMPLANT
KIT HEART LEFT (KITS) ×2 IMPLANT
PACK CARDIAC CATHETERIZATION (CUSTOM PROCEDURE TRAY) ×2 IMPLANT
SHEATH PROBE COVER 6X72 (BAG) ×1 IMPLANT
TRANSDUCER W/STOPCOCK (MISCELLANEOUS) ×2 IMPLANT
TUBING CIL FLEX 10 FLL-RA (TUBING) ×2 IMPLANT

## 2021-04-18 NOTE — Discharge Instructions (Signed)
Radial Site Care  This sheet gives you information about how to care for yourself after your procedure. Your health care provider may also give you more specific instructions. If you have problems or questions, contact your health care provider. What can I expect after the procedure? After the procedure, it is common to have: Bruising and tenderness at the catheter insertion area. Follow these instructions at home: Medicines Take over-the-counter and prescription medicines only as told by your health care provider. Insertion site care Follow instructions from your health care provider about how to take care of your insertion site. Make sure you: Wash your hands with soap and water before you remove your bandage (dressing). If soap and water are not available, use hand sanitizer. May remove dressing in 24 hours. Check your insertion site every day for signs of infection. Check for: Redness, swelling, or pain. Fluid or blood. Pus or a bad smell. Warmth. Do no take baths, swim, or use a hot tub for 5 days. You may shower 24-48 hours after the procedure. Remove the dressing and gently wash the site with plain soap and water. Pat the area dry with a clean towel. Do not rub the site. That could cause bleeding. Do not apply powder or lotion to the site. Activity  For 24 hours after the procedure, or as directed by your health care provider: Do not flex or bend the affected arm. Do not push or pull heavy objects with the affected arm. Do not drive yourself home from the hospital or clinic. You may drive 24 hours after the procedure. Do not operate machinery or power tools. KEEP ARM ELEVATED THE REMAINDER OF THE DAY. Do not push, pull or lift anything that is heavier than 10 lb for 5 days. Ask your health care provider when it is okay to: Return to work or school. Resume usual physical activities or sports. Resume sexual activity. General instructions If the catheter site starts to  bleed, raise your arm and put firm pressure on the site. If the bleeding does not stop, get help right away. This is a medical emergency. DRINK PLENTY OF FLUIDS FOR THE NEXT 2-3 DAYS. No alcohol consumption for 24 hours after receiving sedation. If you went home on the same day as your procedure, a responsible adult should be with you for the first 24 hours after you arrive home. Keep all follow-up visits as told by your health care provider. This is important. Contact a health care provider if: You have a fever. You have redness, swelling, or yellow drainage around your insertion site. Get help right away if: You have unusual pain at the radial site. The catheter insertion area swells very fast. The insertion area is bleeding, and the bleeding does not stop when you hold steady pressure on the area. Your arm or hand becomes pale, cool, tingly, or numb. These symptoms may represent a serious problem that is an emergency. Do not wait to see if the symptoms will go away. Get medical help right away. Call your local emergency services (911 in the [place] Do not drive yourself to the hospital. Summary After the procedure, it is common to have bruising and tenderness at the site. Follow instructions from your health care provider about how to take care of your radial site wound. Check the wound every day for signs of infection.  This information is not intended to replace advice given to you by your health care provider. Make sure you discuss any questions you have with   your health care provider. Document Revised: 11/13/2017 Document Reviewed: 11/13/2017 Elsevier Patient Education  2020 Elsevier Inc.  

## 2021-04-18 NOTE — Interval H&P Note (Signed)
Cath Lab Visit (complete for each Cath Lab visit)  Clinical Evaluation Leading to the Procedure:   ACS: Yes.    Non-ACS:    Anginal Classification: CCS III  Anti-ischemic medical therapy: Minimal Therapy (1 class of medications)  Non-Invasive Test Results: High-risk stress test findings: cardiac mortality >3%/year  Prior CABG: No previous CABG      History and Physical Interval Note:  04/18/2021 12:41 PM  Thomas Bowman  has presented today for surgery, with the diagnosis of hi grade stenosis.  The various methods of treatment have been discussed with the patient and family. After consideration of risks, benefits and other options for treatment, the patient has consented to  Procedure(s): LEFT HEART CATH AND CORONARY ANGIOGRAPHY (N/A) as a surgical intervention.  The patient's history has been reviewed, patient examined, no change in status, stable for surgery.  I have reviewed the patient's chart and labs.  Questions were answered to the patient's satisfaction.     Belva Crome III

## 2021-04-18 NOTE — CV Procedure (Addendum)
Normal epicardial coronary arteries. Microvascular physiologic testing demonstrates: CFR 1.5 (normal >2.0), and IMR (Index of microvascular resistance) 35 (Normal <25 ), with normal FFR .92 and Pd/Pa .95. Normal LV function.  Normal LVEDP.  Studies suggests microvascular dysfunction with increased resistance.  Recommendation is to alter medication regimen perhaps by increasing vasodilator therapy intensity (with calcium channel blocker and ACE inhibitor/ARB) +/-adding long acting nitrates, etc. If this does not work, add beta blocker to amlodipine. Will be a trial and error approach.

## 2021-04-19 ENCOUNTER — Encounter (HOSPITAL_COMMUNITY): Payer: Self-pay | Admitting: Interventional Cardiology

## 2021-04-20 ENCOUNTER — Telehealth: Payer: Self-pay | Admitting: *Deleted

## 2021-04-20 NOTE — Telephone Encounter (Addendum)
Per Dr Ihor Gully should schedule post cath appointment with APP. Patient aware appointment has been scheduled with Thomas Bowman Friday April 21, 2021 3:15 PM

## 2021-04-21 ENCOUNTER — Other Ambulatory Visit: Payer: Self-pay

## 2021-04-21 ENCOUNTER — Encounter: Payer: Self-pay | Admitting: Family

## 2021-04-21 ENCOUNTER — Ambulatory Visit (INDEPENDENT_AMBULATORY_CARE_PROVIDER_SITE_OTHER): Payer: Medicare Other | Admitting: Family

## 2021-04-21 VITALS — BP 162/100 | HR 65 | Ht 66.0 in | Wt 174.8 lb

## 2021-04-21 DIAGNOSIS — I10 Essential (primary) hypertension: Secondary | ICD-10-CM

## 2021-04-21 DIAGNOSIS — I208 Other forms of angina pectoris: Secondary | ICD-10-CM

## 2021-04-21 DIAGNOSIS — E782 Mixed hyperlipidemia: Secondary | ICD-10-CM | POA: Diagnosis not present

## 2021-04-21 DIAGNOSIS — R011 Cardiac murmur, unspecified: Secondary | ICD-10-CM | POA: Diagnosis not present

## 2021-04-21 MED ORDER — DILTIAZEM HCL ER COATED BEADS 240 MG PO CP24: 240.0000 mg | ORAL_CAPSULE | Freq: Every day | ORAL | 5 refills | Status: DC

## 2021-04-21 NOTE — Progress Notes (Signed)
Office Visit    Patient Name: Thomas Bowman Date of Encounter: 04/21/2021  PCP:  [facility], [facility]   [facility]  Cardiologist:  Fransico Him, MD  Advanced Practice Provider:  No care team member to display Electrophysiologist:  None   Chief Complaint    Thomas Bowman is a 70 y.o. male with a hx of Dm2, HLD, HTN, OSA (not on CPAP, cardiac murmur, coronary arteries with microvascular dysfunction presents today for follow up after cardiac cath   Past Medical History    Past Medical History:  Diagnosis Date   Bone cancer ([facility]) 90s   DM (diabetes mellitus) ([place])    History of bone cancer    HLD (hyperlipidemia)    Hypertension    Leg pain    Muscle strain of shoulder region    OSA (obstructive sleep apnea)    Overweight    Prostate cancer ([place])    PSA elevation    Shoulder pain    Past Surgical History:  Procedure Laterality Date   APPENDECTOMY     FACIAL NERVE SURGERY Right    INTRAVASCULAR PRESSURE WIRE/FFR STUDY N/A 04/18/2021   Procedure: INTRAVASCULAR PRESSURE WIRE/FFR STUDY;  Surgeon: Belva Crome, MD;  Location: [facility];  Service: Cardiovascular;  Laterality: N/A;   LEFT HEART CATH AND CORONARY ANGIOGRAPHY N/A 04/18/2021   Procedure: LEFT HEART CATH AND CORONARY ANGIOGRAPHY;  Surgeon: Belva Crome, MD;  Location: [facility];  Service: Cardiovascular;  Laterality: N/A;   PROSTATE BIOPSY      Allergies  Allergies  Allergen Reactions   Hydralazine Hcl     Pt experienced severe cramps and aches in hands and legs when dose increased from 25 TID to 50 TID. Resolved upon discontinuation of drug.   Sulfa Antibiotics Rash    History of Present Illness    Thomas Bowman is a 70 y.o. male with a hx of Dm2, HLD, HTN, OSA (not on CPAP, cardiac murmur, coronary arteries with microvascular dysfunction last seen for cardiac catheterization 04/18/21.  He was seen by Dr. Radford Pax in consultation 6 Lasix was 22 due to  exertional chest discomfort. Coronary CTA with concern for high grade stenosis in proximal RCA with abnormal FFR.   Left heart catheterization 04/18/21 with normal epicardial coronary arteries with right dominant anatomy. LVEF >50%. Abnormal comprehensive microvascular analysis wit CFR 1.5 (normal >2) and index of microvascular resistance (IMR) or 38 (normal less than 25). Recommended to consider negative inotropic CCB and renin angiotensin blockage or consider addition of beta blocker (though not alongside negative inotropic calcium blocker). Also recommended consideration of long acting nitrates.   Case discussed with Dr. Tamala Julian who performed catheterization prior to office visit.  He recommended initial therapy by transitioning amlodipine to diltiazem. If not at maximum benefit and if HR will allow, add beta blocker. Add Imdur as last agent as given his particular microvascular dysfunction Dr. Tamala Julian believes CCB with possible BB and continuation of ACE will be most effective  Presents today for follow up with his wife.  He is overall very frustrated with his cardiac catheterization as feels they "found nothing ". Notes he has had some midsternal chest discomfort since the procedure which occurs both at rest and with activity.  He previously had chest pain only when he would walk on a junk[place].  He very much enjoys walking and staying active.  We discussed microvascular angina and the etiology of his  chest pain.  We reviewed options for medical therapy for microvascular angina.  Tells me he did "does not believe in medications "and that they always cause some other side effects.  He tells me he has had high blood pressure ever since he was a child.  He does get his medications through a bubble pack from the pharmacy.  He did not take his medications today.  EKGs/Labs/Other Studies Reviewed:   The following studies were reviewed today:  LHC 04/18/21  Normal epicardial coronary arteries with right  dominant anatomy. LV function with EF greater than 50%.  EDP less than 15 mmHg. Abnormal comprehensive microvascular analysis with CFR 1.5 (normal greater than 2.0) and index of microvascular resistance (IMR) of 38 (normal less than 25).   RECOMMENDATIONS:   No epicardial coronary disease is noted.  There was no ostial right coronary disease as suspected from the CT. Microvascular physiologic analysis suggest MICROVASCULAR DYSFUNCTION as a possible explanation for the patient's exertional angina. Consider negative inotropic calcium channel blocker and renin angiotensin blockade.  Addition of beta-blocker therapy may be helpful however would not mix with a negative inotropic calcium blocker.  Addition of long-acting nitrates may also be helpful.  Overall, management may include multiple medication changes and combinations to find the correct combination.  Echo 12/27/20 1. Left ventricular ejection fraction, by estimation, is 55 to 60%. Left  ventricular ejection fraction by 3D volume is 55 %. The left ventricle has  normal function. The left ventricle has no regional wall motion  abnormalities. Left ventricular diastolic   parameters are indeterminate. The average left ventricular global  longitudinal strain is -22.3 %. The global longitudinal strain is normal.   2. Right ventricular systolic function is normal. The right ventricular  size is normal. There is normal pulmonary artery systolic pressure. The  estimated right ventricular systolic pressure is 94.7 mmHg.   3. The mitral valve is grossly normal. Trivial mitral valve  regurgitation. No evidence of mitral stenosis.   4. The aortic valve is tricuspid. There is mild calcification of the  aortic valve. Aortic valve regurgitation is not visualized. No aortic  stenosis is present.   5. The inferior vena cava is normal in size with greater than 50%  respiratory variability, suggesting right atrial pressure of 3 mmHg.    Conclusion(s)/Recommendation(s): Normal biventricular function without  evidence of hemodynamically significant valvular heart disease.  EKG:  No EKG is  ordered today.    Recent Labs: 04/17/2021: BUN 14; Creatinine, Ser 0.99; Hemoglobin 14.3; Platelets 178; Potassium 4.3; Sodium 137  Recent Lipid Panel    Component Value Date/Time   CHOL 181 11/29/2015 1425   TRIG 135 11/29/2015 1425   HDL 47 11/29/2015 1425   CHOLHDL 3.9 11/29/2015 1425   VLDL 27 11/29/2015 1425   LDLCALC 107 11/29/2015 1425   Home Medications   Current Meds  Medication Sig   aspirin 81 MG tablet Take 81 mg by mouth daily.   diclofenac sodium (VOLTAREN) 1 % GEL Apply 4 g topically 4 (four) times daily.   diltiazem (CARDIZEM CD) 240 MG 24 hr capsule Take 1 capsule (240 mg total) by mouth daily.   empagliflozin (JARDIANCE) 10 MG TABS tablet Take 10 mg by mouth daily.   finasteride (PROSCAR) 5 MG tablet Take 5 mg by mouth at bedtime.   glucose blood (TRUE METRIX PRO BLOOD GLUCOSE) test strip 1 each by Other route as needed for other. Use as instructed   Lancets Thin MISC by Does not  apply route. 23 gauge   lisinopril (PRINIVIL,ZESTRIL) 40 MG tablet TAKE ONE TABLET BY MOUTH ONCE DAILY   metFORMIN (GLUCOPHAGE) 500 MG tablet Take 500 mg by mouth at bedtime.   [DISCONTINUED] amLODipine (NORVASC) 10 MG tablet TAKE ONE TABLET BY MOUTH ONCE DAILY     Review of Systems   All other systems reviewed and are otherwise negative except as noted above.  Physical Exam    VS:  BP (!) 162/100   Pulse 65   Ht 5\' 6"  (1.676 m)   Wt 174 lb 12.8 oz (79.3 kg)   SpO2 97%   BMI 28.21 kg/m  , BMI Body mass index is 28.21 kg/m.  Wt Readings from Last 3 Encounters:  04/21/21 174 lb 12.8 oz (79.3 kg)  04/18/21 187 lb (84.8 kg)  03/27/21 172 lb 12.8 oz (78.4 kg)     GEN: Well nourished, well developed, in no acute distress. HEENT: normal. Neck: Supple, no JVD, carotid bruits, or masses. Cardiac: RRR, no murmurs, rubs, or  gallops. No clubbing, cyanosis, edema.  Radials/DP/PT 2+ and equal bilaterally.  Respiratory:  Respirations regular and unlabored, clear to auscultation bilaterally. GI: Soft, nontender, nondistended. MS: No deformity or atrophy. Skin: Warm and dry, no rash.  Right radial catheterization site clean, dry, intact with no ecchymosis or evidence of hematoma. Neuro:  Strength and sensation are intact. Psych: Normal affect.  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  Cardiac catheterization with no coronary artery calcification.  Did note microvascular angina.  Plan to optimize medical therapy for INCOA. R radial cath site healing appropriately. Notes continued exertional and rest midsternal discomfort.  Stop Amlodipine. Start Diltiazem 240mg  daily.  Discussed with Dr. Tamala Julian, interventional list, in detail.  Plan to start by addition of calcium channel blocker and uptitrate as needed.  Second line agent of beta-blocker.  Third line agent of Imdur.  Of note, the patient is very hesitant regarding medication changes.  Handout on microvascular angina provided.  Cardiac murmur -  Echo 12/2020 with normal LVEF and no significant valvular abnormality.  HTN -BP uncontrolled today in the setting of not taking his medications.  We discussed the importance of taking his medications daily as prescribed.  He uses a bubble pack from the pharmacy.  HLD -LDL goal less than 100.  Continue atorvastatin 20 mg daily.  Management per PCP.  DM2 - Continue to follow with PCP.   Disposition: Follow up in 2 month(s) with Dr. Radford Pax or APP   Signed, Loel Dubonnet, NP 04/21/2021, 5:22 PM [address]

## 2021-04-21 NOTE — Patient Instructions (Addendum)
Medication Instructions:  Your physician has recommended you make the following change in your medication:   RESUME Lisinopril 40mg  daily  STOP Amlodipine  START Diltiazem 240mg  one time daily  *If you need a refill on your cardiac medications before your next appointment, please call your pharmacy*   Lab Work: None ordered today   Testing/Procedures: Your cardiac catheterization showed microvascular dysfunction - there are additional details below. There was no significant plaque buildup which is a good result.    Follow-Up: At [facility], you and your health needs are our priority.  As part of our continuing mission to provide you with exceptional heart care, we have created designated Provider Care Teams.  These Care Teams include your primary Cardiologist (physician) and Advanced Practice Providers (APPs -  Physician Assistants and Nurse Practitioners) who all work together to provide you with the care you need, when you need it.  We recommend signing up for the patient portal called "MyChart".  Sign up information is provided on this After Visit Summary.  MyChart is used to connect with patients for Virtual Visits (Telemedicine).  Patients are able to view lab/test results, encounter notes, upcoming appointments, etc.  Non-urgent messages can be sent to your provider as well.   To learn more about what you can do with MyChart, go to [email or web address].    Your next appointment:   2 month(s)  07/10/2021 ARRIVE AT 11:25  The format for your next appointment:   In Person  Provider:   You may see Fransico Him, MD or one of the following Advanced Practice Providers on your designated Care Team:   Melina Copa, PA-C Ermalinda Barrios, PA-C   Other Instructions  Coronary Microvascular Disease  Coronary microvascular disease (MVD), sometimes called small artery disease or small vessel disease, is heart disease in which the walls and inner lining of the heart's small arteries  are damaged. The damage may lead to a tightening (spasm) of the arteries, which causes decreased blood flow to the heart. The tiny arteries of the heart may also have abnormalities that decrease the blood flowto the heart muscle.  MVD is different from traditional heart disease, or coronary artery disease (CAD), in which plaque builds up in the large arteries of the heart. The risk factors for MVD are similar to those for CAD, and microvascular disease is morecommon in women. What are the causes? This condition is caused by damage to the walls and inner lining of the smallarteries of the heart. The exact cause of the damage is often not known. What increases the risk? Some things that may increase the risk for MVD include: High blood pressure (hypertension). Diabetes. Smoking. Very high cholesterol levels. Being overweight or obese. Inactivity (sedentary lifestyle). An unhealthy diet. A family history of early heart disease. Being a male. What are the signs or symptoms? Symptoms of this condition include: Chest pain (angina). The pain can: Feel like a burning, squeezing, or pressure sensation. Spread (radiate) to the neck, arm, back, or jaw. Differ from other angina in these ways: It usually lasts longer than 10 minutes and can last longer than 30 minutes. It may occur along with shortness of breath, sleep problems, fatigue, and lack of energy. It is usually first noticed during routine daily activities or times of mental stress. Nausea. Weakness. Dizziness. Sweating. Fluttering or fast heartbeat (palpitations). How is this diagnosed? This condition may be diagnosed based on: Your medical history, including evaluation for risk factors. A physical exam. Tests, such as: Blood  tests. Electrocardiogram (ECG). This test checks the electrical activity in the heart. MRI of the heart (cardiac MRI). This imaging test shows the function and structure of the heart and blood vessels  (cardiovascular system). An exercise stress test. For this test, your heart function and blood pressure are monitored before, during, and after exercise. An ECG is used during this test. Coronary angiogram with additional blood flow testing. For this procedure, dye is injected into your coronary arteries before X-rays are taken to check blood flow and heart function. PET scan of the heart (cardiac PET scan). This imaging test makes pictures to show healthy and damaged heart muscle. Sometimes, standard tests do not show MVD. Standard coronary angiogram only shows blockages in the large arteries. You may still be diagnosed with MVD,even if the angiogram results are normal. How is this treated? This condition is managed with medicines, such as medicines to: Control cholesterol levels (statin medicines). Lower blood pressure (ACE inhibitors). Prevent blood clots or control inflammation. Treat angina and improve blood flow to the heart (nitroglycerin). Relax blood vessels (beta blockers or calcium channel blockers). Follow these instructions at home: Lifestyle  Be physically active every day. Ask your health care provider how much physical activity you need and what types of exercise are best for you. Do not use any products that contain nicotine or tobacco. These products include cigarettes, chewing tobacco, and vaping devices, such as e-cigarettes. If you need help quitting, ask your health care provider. Follow a heart-healthy diet. A dietitian can help you learn about healthy food options and changes. In general, eat plenty of fruits and vegetables, lean meats, and whole grains.  General instructions Take over-the-counter and prescription medicines only as told by your health care provider. Maintain a healthy weight or work with your health care provider to lose weight safely if needed. Manage any other health conditions that affect your heart, such as hypertension and diabetes. Keep all  follow-up visits. This is important. Contact a health care provider if: You have trouble doing your daily activities. You feel nauseous. You feel light-headed. You have a fever or chills. Get help right away if: You have discomfort in the center of your chest that: Lasts for more than a few minutes. Goes away and comes back (recurs). You have shortness of breath. You have pressure, pain, or a sense of fullness or squeezing in your chest. These symptoms may represent a serious problem that is an emergency. Do not wait to see if the symptoms will go away. Get medical help right away. Call your local emergency services (911 in the [place] Do not drive yourself to the hospital. Summary Coronary microvascular disease (MVD) is heart disease in which the walls and inner lining of the heart's small arteries are damaged. Symptoms of this condition include chest pain (angina). This usually lasts at least 10 minutes and can last longer than 30 minutes. This condition is diagnosed by your health care provider based on your medical history, a physical exam, and test results. This condition is managed with medicines and other lifestyle changes. This information is not intended to replace advice given to you by your health care provider. Make sure you discuss any questions you have with your healthcare provider. Document Revised: 03/30/2020 Document Reviewed: 03/30/2020 Elsevier Patient Education  [place].

## 2021-04-28 NOTE — Interval H&P Note (Signed)
Cath Lab Visit (complete for each Cath Lab visit)  Clinical Evaluation Leading to the Procedure:   ACS: No.  Non-ACS:    Anginal Classification: CCS III  Anti-ischemic medical therapy: Minimal Therapy (1 class of medications)  Non-Invasive Test Results: No non-invasive testing performed  Prior CABG: No previous CABG      History and Physical Interval Note:  04/28/2021 11:27 AM  Thomas Bowman  has presented today for surgery, with the diagnosis of hi grade stenosis.  The various methods of treatment have been discussed with the patient and family. After consideration of risks, benefits and other options for treatment, the patient has consented to  Procedure(s): LEFT HEART CATH AND CORONARY ANGIOGRAPHY (N/A) INTRAVASCULAR PRESSURE WIRE/FFR STUDY (N/A) as a surgical intervention.  The patient's history has been reviewed, patient examined, no change in status, stable for surgery.  I have reviewed the patient's chart and labs.  Questions were answered to the patient's satisfaction.     Belva Crome III

## 2021-07-10 ENCOUNTER — Ambulatory Visit (INDEPENDENT_AMBULATORY_CARE_PROVIDER_SITE_OTHER): Payer: Medicare Other | Admitting: Cardiology

## 2021-07-10 ENCOUNTER — Other Ambulatory Visit: Payer: Self-pay

## 2021-07-10 ENCOUNTER — Encounter: Payer: Self-pay | Admitting: Cardiology

## 2021-07-10 ENCOUNTER — Encounter: Payer: Self-pay | Admitting: *Deleted

## 2021-07-10 VITALS — BP 152/82 | HR 73 | Ht 66.5 in | Wt 176.2 lb

## 2021-07-10 DIAGNOSIS — E7800 Pure hypercholesterolemia, unspecified: Secondary | ICD-10-CM | POA: Diagnosis not present

## 2021-07-10 DIAGNOSIS — I10 Essential (primary) hypertension: Secondary | ICD-10-CM

## 2021-07-10 DIAGNOSIS — R011 Cardiac murmur, unspecified: Secondary | ICD-10-CM | POA: Diagnosis not present

## 2021-07-10 DIAGNOSIS — I208 Other forms of angina pectoris: Secondary | ICD-10-CM | POA: Diagnosis not present

## 2021-07-10 DIAGNOSIS — R030 Elevated blood-pressure reading, without diagnosis of hypertension: Secondary | ICD-10-CM

## 2021-07-10 MED ORDER — METOPROLOL SUCCINATE ER 25 MG PO TB24: 12.5000 mg | ORAL_TABLET | Freq: Every day | ORAL | 3 refills | Status: DC

## 2021-07-10 NOTE — Patient Instructions (Addendum)
Medication Instructions:  Your physician has recommended you make the following change in your medication:  1) STOP taking Cardizem  2) START taking Toprol XL 12.5 mg dialy   *If you need a refill on your cardiac medications before your next appointment, please call your pharmacy*   Testing/Procedures: Your physician has recommended that you wear an event monitor. Event monitors are medical devices that record the heart's electrical activity. Doctors most often [place] these monitors to diagnose arrhythmias. Arrhythmias are problems with the speed or rhythm of the heartbeat. The monitor is a small, portable device. You can wear one while you do your normal daily activities. This is usually used to diagnose what is causing palpitations/syncope (passing out).  Your physician has recommended that you wear a 24 hour blood pressure monitor.    Follow-Up: At [facility], you and your health needs are our priority.  As part of our continuing mission to provide you with exceptional heart care, we have created designated Provider Care Teams.  These Care Teams include your primary Cardiologist (physician) and Advanced Practice Providers (APPs -  Physician Assistants and Nurse Practitioners) who all work together to provide you with the care you need, when you need it.  Your next appointment:   4-6 week(s)  The format for your next appointment:   In Person  Provider:   Laurann Montana, NP  Follow up with Dr. Radford Pax in 6 months   Other Instructions [employer] Cardiac Event Monitor Instructions Your physician has requested you wear your cardiac event monitor for _30_ days. [employer] may call or text to confirm a shipping address. The monitor will be sent to a land address via UPS. [employer] will not ship a monitor to a PO BOX. It typically takes 3-5 days to receive your monitor after it has been enrolled. [employer] will assist with USPS tracking if your package is delayed. The telephone number  for [employer] is [phone]. Once you have received your monitor, please review the enclosed instructions. Instruction tutorials can also be viewed under help and settings on the enclosed cell phone. Your monitor has already been registered assigning a specific monitor serial # to you.  Applying the monitor Remove cell phone from case and turn it on. The cell phone works as Dealer and needs to be within [employer] of you at all times. The cell phone will need to be charged on a daily basis. We recommend you plug the cell phone into the enclosed charger at your bedside table every night.  Monitor batteries: You will receive two monitor batteries labelled #1 and #2. These are your recorders. Plug battery #2 onto the second connection on the enclosed charger. Keep one battery on the charger at all times. This will keep the monitor battery deactivated. It will also keep it fully charged for when you need to switch your monitor batteries. A small light will be blinking on the battery emblem when it is charging. The light on the battery emblem will remain on when the battery is fully charged.  Open package of a Monitor strip. Insert battery #1 into black hood on strip and gently squeeze monitor battery onto connection as indicated in instruction booklet. Set aside while preparing skin.  Choose location for your strip, vertical or horizontal, as indicated in the instruction booklet. Shave to remove all hair from location. There cannot be any lotions, oils, powders, or colognes on skin where monitor is to be applied. Wipe skin clean with enclosed Saline wipe. Dry skin  completely.  Peel paper labeled #1 off the back of the Monitor strip exposing the adhesive. Place the monitor on the chest in the vertical or horizontal position shown in the instruction booklet. One arrow on the monitor strip must be pointing upward. Carefully remove paper labeled #2, attaching remainder of strip to your  skin. Try not to create any folds or wrinkles in the strip as you apply it.  Firmly press and release the circle in the center of the monitor battery. You will hear a small beep. This is turning the monitor battery on. The heart emblem on the monitor battery will light up every 5 seconds if the monitor battery in turned on and connected to the patient securely. Do not push and hold the circle down as this turns the monitor battery off. The cell phone will locate the monitor battery. A screen will appear on the cell phone checking the connection of your monitor strip. This may read poor connection initially but change to good connection within the next minute. Once your monitor accepts the connection you will hear a series of 3 beeps followed by a climbing crescendo of beeps. A screen will appear on the cell phone showing the two monitor strip placement options. Touch the picture that demonstrates where you applied the monitor strip.  Your monitor strip and battery are waterproof. You are able to shower, bathe, or swim with the monitor on. They just ask you do not submerge deeper than 3 feet underwater. We recommend removing the monitor if you are swimming in a lake, river, or ocean.  Your monitor battery will need to be switched to a fully charged monitor battery approximately once a week. The cell phone will alert you of an action which needs to be made.  On the cell phone, tap for details to reveal connection status, monitor battery status, and cell phone battery status. The green dots indicates your monitor is in good status. A red dot indicates there is something that needs your attention.  To record a symptom, click the circle on the monitor battery. In 30-60 seconds a list of symptoms will appear on the cell phone. Select your symptom and tap save. Your monitor will record a sustained or significant arrhythmia regardless of you clicking the button. Some patients do not feel the heart  rhythm irregularities. [employer] will notify us of any serious or critical events.  Refer to instruction booklet for instructions on switching batteries, changing strips, the Do not disturb or Pause features, or any additional questions.  Call [employer] at [phone], to confirm your monitor is transmitting and record your baseline. They will answer any questions you may have regarding the monitor instructions at that time.  Returning the monitor to [place] all equipment back into blue box. Peel off strip of paper to expose adhesive and close box securely. There is a prepaid UPS shipping label on this box. Drop in a UPS drop box, or at a UPS facility like [employer]. You may also contact [employer] to arrange UPS to pick up monitor package at your home.

## 2021-07-10 NOTE — Addendum Note (Signed)
Addended by: Antonieta Iba on: 07/10/2021 12:22 PM   Modules accepted: Orders

## 2021-07-10 NOTE — Progress Notes (Unsigned)
Patient ID: Thomas Bowman, male   DOB: 1951/03/13, 70 y.o.   MRN: [id number] Patient enrolled for [employer] to ship a 30 day cardiac event monitor to his home.

## 2021-07-10 NOTE — Progress Notes (Signed)
Office Visit    Patient Name: Thomas Bowman Date of Encounter: 07/10/2021  PCP:  Medicine, [facility]  Cardiologist:  Fransico Him, MD  Advanced Practice Provider:  No care team member to display Electrophysiologist:  None   Chief Complaint    Thomas Bowman is a 70 y.o. male with a hx of Dm2, HLD, HTN, OSA (not on CPAP, cardiac murmur, coronary arteries with microvascular dysfunction presents today for follow up  Past Medical History    Past Medical History:  Diagnosis Date   Bone cancer ([place]) 90s   DM (diabetes mellitus) ([place])    History of bone cancer    HLD (hyperlipidemia)    Hypertension    Leg pain    Muscle strain of shoulder region    OSA (obstructive sleep apnea)    Overweight    Prostate cancer ([place])    PSA elevation    Shoulder pain    Past Surgical History:  Procedure Laterality Date   APPENDECTOMY     FACIAL NERVE SURGERY Right    INTRAVASCULAR PRESSURE WIRE/FFR STUDY N/A 04/18/2021   Procedure: INTRAVASCULAR PRESSURE WIRE/FFR STUDY;  Surgeon: Belva Crome, MD;  Location: [facility];  Service: Cardiovascular;  Laterality: N/A;   LEFT HEART CATH AND CORONARY ANGIOGRAPHY N/A 04/18/2021   Procedure: LEFT HEART CATH AND CORONARY ANGIOGRAPHY;  Surgeon: Belva Crome, MD;  Location: [facility];  Service: Cardiovascular;  Laterality: N/A;   PROSTATE BIOPSY      Allergies  Allergies  Allergen Reactions   Hydralazine Hcl     Pt experienced severe cramps and aches in hands and legs when dose increased from 25 TID to 50 TID. Resolved upon discontinuation of drug.   Sulfa Antibiotics Rash    History of Present Illness    Thomas Bowman is a 70 y.o. male with a hx of Dm2, HLD, HTN, OSA (not on CPAP, cardiac murmur, coronary arteries with microvascular dysfunction last seen for cardiac catheterization 04/18/21.  He initially was seen by me for  exertional chest discomfort. Coronary CTA with  concern for high grade stenosis in proximal RCA with abnormal FFR.   Left heart catheterization 04/18/21 with normal epicardial coronary arteries with right dominant anatomy. LVEF >50%. Abnormal comprehensive microvascular analysis wit CFR 1.5 (normal >2) and index of microvascular resistance (IMR) or 38 (normal less than 25). Recommended to consider negative inotropic CCB and renin angiotensin blockage or consider addition of beta blocker (though not alongside negative inotropic calcium blocker). Also recommended consideration of long acting nitrates.   Recommendations were for initial therapy with diltiazem. If not at maximum benefit and if HR will allow, add beta blocker. Add Imdur as last agent as given his particular microvascular dysfunction it was felt that CCB with possible BB and continuation of ACE will be most effective.  He was seen by the PA recently has changed from Amlodipine to Cardizem CD 240mg  daily.  He says that 3 days after starting the medication he was sitting on the back of his truck talking and got very hot and then passed.  He says that his friend got a wet towel and placed it on his head. He refused for anyone to call EMS.  He stopped taking the Cardizem.    He is here today for followup.  He denies any chest pain or pressure, SOB, DOE, PND, orthopnea, LE edema, dizziness, palpitations. He is compliant  with his meds and is tolerating meds with no SE.     EKGs/Labs/Other Studies Reviewed:   The following studies were reviewed today:  LHC 04/18/21  Normal epicardial coronary arteries with right dominant anatomy. LV function with EF greater than 50%.  EDP less than 15 mmHg. Abnormal comprehensive microvascular analysis with CFR 1.5 (normal greater than 2.0) and index of microvascular resistance (IMR) of 38 (normal less than 25).   RECOMMENDATIONS:   No epicardial coronary disease is noted.  There was no ostial right coronary disease as suspected from the CT. Microvascular  physiologic analysis suggest MICROVASCULAR DYSFUNCTION as a possible explanation for the patient's exertional angina. Consider negative inotropic calcium channel blocker and renin angiotensin blockade.  Addition of beta-blocker therapy may be helpful however would not mix with a negative inotropic calcium blocker.  Addition of long-acting nitrates may also be helpful.  Overall, management may include multiple medication changes and combinations to find the correct combination.  Echo 12/27/20 1. Left ventricular ejection fraction, by estimation, is 55 to 60%. Left  ventricular ejection fraction by 3D volume is 55 %. The left ventricle has  normal function. The left ventricle has no regional wall motion  abnormalities. Left ventricular diastolic   parameters are indeterminate. The average left ventricular global  longitudinal strain is -22.3 %. The global longitudinal strain is normal.   2. Right ventricular systolic function is normal. The right ventricular  size is normal. There is normal pulmonary artery systolic pressure. The  estimated right ventricular systolic pressure is 34.7 mmHg.   3. The mitral valve is grossly normal. Trivial mitral valve  regurgitation. No evidence of mitral stenosis.   4. The aortic valve is tricuspid. There is mild calcification of the  aortic valve. Aortic valve regurgitation is not visualized. No aortic  stenosis is present.   5. The inferior vena cava is normal in size with greater than 50%  respiratory variability, suggesting right atrial pressure of 3 mmHg.   Conclusion(s)/Recommendation(s): Normal biventricular function without  evidence of hemodynamically significant valvular heart disease.  EKG:  No EKG is  ordered today.    Recent Labs: 04/17/2021: BUN 14; Creatinine, Ser 0.99; Hemoglobin 14.3; Platelets 178; Potassium 4.3; Sodium 137  Recent Lipid Panel    Component Value Date/Time   CHOL 181 11/29/2015 1425   TRIG 135 11/29/2015 1425   HDL 47  11/29/2015 1425   CHOLHDL 3.9 11/29/2015 1425   VLDL 27 11/29/2015 1425   LDLCALC 107 11/29/2015 1425   Home Medications   Current Meds  Medication Sig   aspirin 81 MG tablet Take 81 mg by mouth daily.   diclofenac sodium (VOLTAREN) 1 % GEL Apply 4 g topically 4 (four) times daily.   diltiazem (CARDIZEM CD) 240 MG 24 hr capsule Take 1 capsule (240 mg total) by mouth daily.   empagliflozin (JARDIANCE) 10 MG TABS tablet Take 10 mg by mouth daily.   finasteride (PROSCAR) 5 MG tablet Take 5 mg by mouth at bedtime.   glucose blood (TRUE METRIX PRO BLOOD GLUCOSE) test strip 1 each by Other route as needed for other. Use as instructed   Lancets Thin MISC by Does not apply route. 23 gauge   lisinopril (PRINIVIL,ZESTRIL) 40 MG tablet TAKE ONE TABLET BY MOUTH ONCE DAILY   metFORMIN (GLUCOPHAGE) 500 MG tablet Take 500 mg by mouth at bedtime.     Review of Systems   All other systems reviewed and are otherwise negative except as noted above.  Physical  Exam    VS:  BP (!) 152/82   Pulse 73   Ht 5' 6.5" (1.689 m)   Wt 176 lb 3.2 oz (79.9 kg)   SpO2 97%   BMI 28.01 kg/m  , BMI Body mass index is 28.01 kg/m.  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   -Cardiac catheterization with no coronary artery calcification.  Did note microvascular angina.   -Plan to optimize medical therapy for INCOA.   -Continue ASA with PRN refills   Cardiac murmur  - Echo 12/2020 with normal LVEF and no significant valvular abnormality.  HTN  -BP borderline controlled on exam today -encouraged him to be compliant with his medication -Continue prescription drug management with Lisinopril 40mg  daily>refilled -he stopped Cardizem and said he wont take it as he thinks it caused him to pass out -will start Toprol XL 12.5mg  daily -I have personally reviewed and interpreted outside labs performed by patient's PCP which showed SCr 0.99, K+ 4.3 in June 2022    HLD  -LDL goal less than 100.   -Continue atorvastatin 20 mg daily.   -Management per PCP.  5.  Syncope -?etiology -he got diaphoretic prior to the event ? Whether related to low BS - he says he had never had problems with low BS in the past -will not prescribe Cardizem and will change to Toprol XL 12.5mg  daily -will get 30 day event monitor to rule out arrhythmia   Disposition: Follow up4 weeks with Laurann Montana, NP and me in 6 months  Signed, Fransico Him, MD 07/10/2021, 12:08 PM [place]

## 2021-07-20 ENCOUNTER — Telehealth: Payer: Self-pay | Admitting: *Deleted

## 2021-07-20 NOTE — Telephone Encounter (Signed)
LMVM- Attempting to schedule appointment for a 24 hour ambulatory blood pressure monitor at [facility] office.  The monitor is applied and worn 24 hours, then it needs to be returned to our office the following day.  Please call Darrick Penna or Valetta Fuller in the monitor department at [phone] to schedule.

## 2021-07-24 ENCOUNTER — Ambulatory Visit (INDEPENDENT_AMBULATORY_CARE_PROVIDER_SITE_OTHER): Payer: Medicare Other

## 2021-07-24 DIAGNOSIS — R55 Syncope and collapse: Secondary | ICD-10-CM

## 2021-07-24 DIAGNOSIS — R011 Cardiac murmur, unspecified: Secondary | ICD-10-CM

## 2021-07-24 DIAGNOSIS — E7800 Pure hypercholesterolemia, unspecified: Secondary | ICD-10-CM

## 2021-07-24 DIAGNOSIS — I208 Other forms of angina pectoris: Secondary | ICD-10-CM

## 2021-07-24 DIAGNOSIS — I10 Essential (primary) hypertension: Secondary | ICD-10-CM

## 2021-07-25 ENCOUNTER — Ambulatory Visit (INDEPENDENT_AMBULATORY_CARE_PROVIDER_SITE_OTHER): Payer: Medicare Other | Admitting: Podiatry

## 2021-07-25 ENCOUNTER — Encounter: Payer: Self-pay | Admitting: Podiatry

## 2021-07-25 ENCOUNTER — Other Ambulatory Visit: Payer: Self-pay

## 2021-07-25 DIAGNOSIS — M2140 Flat foot [pes planus] (acquired), unspecified foot: Secondary | ICD-10-CM | POA: Diagnosis not present

## 2021-07-25 DIAGNOSIS — E119 Type 2 diabetes mellitus without complications: Secondary | ICD-10-CM | POA: Diagnosis not present

## 2021-07-25 NOTE — Patient Instructions (Signed)

## 2021-07-30 NOTE — Progress Notes (Signed)
Subjective:   Patient ID: Thomas Bowman, male   DOB: 70 y.o.   MRN: [id number]   HPI 70 year old male presents the office today for diabetic foot exam.  He states he has no concerns to his feet but gets some occasional swelling to his ankle which is not a new issue.  Denies any ulcerations.  He has no cramping or claudication symptoms in his legs.  He is unsure of his last blood sugar or A1c.   Review of Systems  All other systems reviewed and are negative.  Past Medical History:  Diagnosis Date   Bone cancer ([place]) 90s   DM (diabetes mellitus) ([place])    History of bone cancer    HLD (hyperlipidemia)    Hypertension    Leg pain    Muscle strain of shoulder region    OSA (obstructive sleep apnea)    Overweight    Prostate cancer ([facility])    PSA elevation    Shoulder pain     Past Surgical History:  Procedure Laterality Date   APPENDECTOMY     FACIAL NERVE SURGERY Right    INTRAVASCULAR PRESSURE WIRE/FFR STUDY N/A 04/18/2021   Procedure: INTRAVASCULAR PRESSURE WIRE/FFR STUDY;  Surgeon: Belva Crome, MD;  Location: [facility];  Service: Cardiovascular;  Laterality: N/A;   LEFT HEART CATH AND CORONARY ANGIOGRAPHY N/A 04/18/2021   Procedure: LEFT HEART CATH AND CORONARY ANGIOGRAPHY;  Surgeon: Belva Crome, MD;  Location: [facility];  Service: Cardiovascular;  Laterality: N/A;   PROSTATE BIOPSY       Current Outpatient Medications:    amLODipine (NORVASC) 5 MG tablet, , Disp: , Rfl:    atorvastatin (LIPITOR) 20 MG tablet, Take by mouth., Disp: , Rfl:    metFORMIN (GLUCOPHAGE) 1000 MG tablet, Take by mouth., Disp: , Rfl:    aspirin 81 MG tablet, Take 81 mg by mouth daily., Disp: , Rfl:    diclofenac sodium (VOLTAREN) 1 % GEL, Apply 4 g topically 4 (four) times daily., Disp: 100 g, Rfl: 11   empagliflozin (JARDIANCE) 10 MG TABS tablet, Take 10 mg by mouth daily., Disp: , Rfl:    finasteride (PROSCAR) 5 MG tablet, Take 5 mg by mouth at bedtime., Disp: , Rfl:     glucose blood (TRUE METRIX PRO BLOOD GLUCOSE) test strip, 1 each by Other route as needed for other. Use as instructed, Disp: , Rfl:    Lancets Thin MISC, by Does not apply route. 23 gauge, Disp: , Rfl:    lisinopril (PRINIVIL,ZESTRIL) 40 MG tablet, TAKE ONE TABLET BY MOUTH ONCE DAILY, Disp: 90 tablet, Rfl: 3   metFORMIN (GLUCOPHAGE) 500 MG tablet, Take 500 mg by mouth at bedtime., Disp: , Rfl:    metoprolol succinate (TOPROL XL) 25 MG 24 hr tablet, Take 0.5 tablets (12.5 mg total) by mouth daily., Disp: 45 tablet, Rfl: 3  Allergies  Allergen Reactions   Hydralazine Hcl     Pt experienced severe cramps and aches in hands and legs when dose increased from 25 TID to 50 TID. Resolved upon discontinuation of drug.   Sulfa Antibiotics Rash          Objective:  Physical Exam  General: AAO x3, NAD  Dermatological: Skin is warm, dry and supple bilateral. Nails x 10 are well manicured. There are no open sores, no preulcerative lesions, no rash or signs of infection present.  Vascular: Dorsalis Pedis artery and Posterior Tibial artery pedal pulses are 2/4 bilateral with  immedate capillary fill time.  There is no pain with calf compression, swelling, warmth, erythema.  Trace edema to the ankles but there is no erythema or warmth.  Neruologic: Sensation intact with Thornell Mule monofilament  Musculoskeletal: Foot is present.  There is no area of tenderness identified bilaterally.  Flexor, extensor tendons appear to be intact.  Muscular strength 5/5 in all groups tested bilateral.  Gait: Unassisted, Nonantalgic.       Assessment:   Diabetic foot exam     Plan:  Overall he is doing well from a diabetes standpoint.  Discussed with him glucose control check his blood sugar on a regular basis.  Discussed daily foot inspection.  Return in about 1 year (around 07/25/2022) for diabetic foot exam.  Trula Slade DPM

## 2021-08-14 ENCOUNTER — Other Ambulatory Visit: Payer: Self-pay

## 2021-08-14 ENCOUNTER — Ambulatory Visit (INDEPENDENT_AMBULATORY_CARE_PROVIDER_SITE_OTHER): Payer: Medicare Other

## 2021-08-14 ENCOUNTER — Other Ambulatory Visit: Payer: Self-pay | Admitting: Cardiology

## 2021-08-14 DIAGNOSIS — R030 Elevated blood-pressure reading, without diagnosis of hypertension: Secondary | ICD-10-CM | POA: Diagnosis not present

## 2021-08-14 DIAGNOSIS — E7800 Pure hypercholesterolemia, unspecified: Secondary | ICD-10-CM

## 2021-08-14 DIAGNOSIS — I10 Essential (primary) hypertension: Secondary | ICD-10-CM

## 2021-08-14 DIAGNOSIS — I208 Other forms of angina pectoris: Secondary | ICD-10-CM

## 2021-08-14 DIAGNOSIS — R011 Cardiac murmur, unspecified: Secondary | ICD-10-CM

## 2021-08-16 ENCOUNTER — Ambulatory Visit (HOSPITAL_BASED_OUTPATIENT_CLINIC_OR_DEPARTMENT_OTHER): Payer: Medicare Other | Admitting: Family

## 2021-08-16 ENCOUNTER — Telehealth: Payer: Self-pay

## 2021-08-16 MED ORDER — METOPROLOL SUCCINATE ER 25 MG PO TB24: 25.0000 mg | ORAL_TABLET | Freq: Every day | ORAL | 3 refills | Status: AC

## 2021-08-16 NOTE — Telephone Encounter (Signed)
The patient has been notified of the result and verbalized understanding.  All questions (if any) were answered. Antonieta Iba, RN 08/16/2021 5:30 PM  Patient has been scheduled to see PharmD.

## 2021-08-16 NOTE — Telephone Encounter (Signed)
-----   Message from Sueanne Margarita, MD sent at 08/16/2021  2:55 PM EDT ----- BP is not adequately controlled.  Increase Toprol to 25 mg daily.  Follow-up with Pharm.D. in hypertension clinic in 2 weeks

## 2021-08-31 ENCOUNTER — Ambulatory Visit: Payer: Medicare Other | Admitting: Pharmacist

## 2021-08-31 NOTE — Progress Notes (Deleted)
Patient ID: BENIGNO CHECK                 DOB: September 22, 1951                      MRN: [id number]     HPI: Thomas Bowman is a 70 y.o. male referred by Dr. Radford Pax to HTN clinic. PMH is significant for microvascular angina, HTN, DM2, HLD, OSA, and prostate cancer. 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 LHC 04/18/21 showed normal epicardial coronary arteries, LVEF > 50%. Abnormal comprehensive microvascular analysis with CFR 1.5 (normal >2) and index of microvascular resistance of 38 (normal < 25). Recommended to consider negative inotropic CCB and RAAS blockage or consider addition of beta blocker (though not alongside negative inotropic calcium blocker). Also recommended consideration of long acting nitrates.   Recommendations were for initial therapy with diltiazem. If not at maximum benefit and if HR will allow, add beta blocker. Add Imdur as last agent as given his particular microvascular dysfunction it was felt that CCB with possible BB and continuation of ACE will be most effective.  At last visit on 9/19 with Dr Radford Pax, BP was elevated at 152/82, diltiazem was stopped secondary to pt passing out, and Toprol 12.5mg  daily was started. Heart monitor was normal with average HR 69bpm. He underwent 24 hour ambulatory BP monitoring 08/14/21 and his Toprol was increased to 25mg  daily after:  Average overall blood pressure 144/80 mmHg. Average awake blood pressure 148/83 mmHg. Average asleep blood pressure 132/60 mmHg 82% of systolic blood pressures while awake were greater than 140 mmHg and greater than 120 mmHg during sleep 42% of diastolic blood pressures while awake were greater than 90 mmHg and greater than 80 mmHg during sleep.  Avoid thiazides sulfa allergy  Is pt on amlodipine or lisinopril? Resume lisin if needed, can inc toprol, if still chest pain add  imdur 30  Current HTN meds: amlodipine 5mg  daily, lisinopril 40mg  daily, Toprol 25mg  daily Previously tried: hydralazine - cramps in hands and legs on 50mg  TID (tolerated 25mg  TID); diltiazem - passed out on 240mg  daily, thiazides - will avoid d/t rash with sulfa abx BP goal: <130/55mmHg  Family History:   Social History:   Diet:   Exercise:   Home BP readings:   Wt Readings from Last 3 Encounters:  07/10/21 176 lb 3.2 oz (79.9 kg)  04/21/21 174 lb 12.8 oz (79.3 kg)  04/18/21 187 lb (84.8 kg)   BP Readings from Last 3 Encounters:  07/10/21 (!) 152/82  04/21/21 (!) 162/100  04/18/21 (!) 162/86   Pulse Readings from Last 3 Encounters:  07/10/21 73  04/21/21 65  04/18/21 (!) 54    Renal function: CrCl cannot be calculated (Patient's most recent lab result is older than the maximum 21 days allowed.).  Past Medical History:  Diagnosis Date   Bone cancer ([place]) 90s   DM (diabetes mellitus) ([place])    History of bone cancer    HLD (hyperlipidemia)    Hypertension    Leg pain    Muscle strain of shoulder region    OSA (obstructive sleep apnea)    Overweight    Prostate cancer ([facility])    PSA elevation    Shoulder pain     Current Outpatient Medications on File Prior to Visit  Medication Sig Dispense Refill  amLODipine (NORVASC) 5 MG tablet      aspirin 81 MG tablet Take 81 mg by mouth daily.     atorvastatin (LIPITOR) 20 MG tablet Take by mouth.     diclofenac sodium (VOLTAREN) 1 % GEL Apply 4 g topically 4 (four) times daily. 100 g 11   empagliflozin (JARDIANCE) 10 MG TABS tablet Take 10 mg by mouth daily.     finasteride (PROSCAR) 5 MG tablet Take 5 mg by mouth at bedtime.     glucose blood (TRUE METRIX PRO BLOOD GLUCOSE) test strip 1 each by Other route as needed for other. Use as instructed     Lancets Thin MISC by Does not apply route. 23 gauge     lisinopril (PRINIVIL,ZESTRIL) 40 MG tablet TAKE ONE TABLET BY MOUTH ONCE DAILY 90 tablet 3   metFORMIN (GLUCOPHAGE)  1000 MG tablet Take by mouth.     metFORMIN (GLUCOPHAGE) 500 MG tablet Take 500 mg by mouth at bedtime.     metoprolol succinate (TOPROL XL) 25 MG 24 hr tablet Take 1 tablet (25 mg total) by mouth daily. 90 tablet 3   No current facility-administered medications on file prior to visit.    Allergies  Allergen Reactions   Hydralazine Hcl     Pt experienced severe cramps and aches in hands and legs when dose increased from 25 TID to 50 TID. Resolved upon discontinuation of drug.   Sulfa Antibiotics Rash     Assessment/Plan:  1. Hypertension -

## 2021-12-29 ENCOUNTER — Emergency Department (HOSPITAL_COMMUNITY): Payer: Medicare Other

## 2021-12-29 ENCOUNTER — Encounter (HOSPITAL_COMMUNITY): Payer: Self-pay

## 2021-12-29 ENCOUNTER — Emergency Department (HOSPITAL_COMMUNITY)
Admission: EM | Admit: 2021-12-29 | Discharge: 2021-12-29 | Disposition: A | Discharge status: Home / Self Care | Payer: Medicare Other | Attending: Emergency Medicine | Admitting: Emergency Medicine

## 2021-12-29 ENCOUNTER — Other Ambulatory Visit: Payer: Self-pay

## 2021-12-29 DIAGNOSIS — Z85819 Personal history of malignant neoplasm of unspecified site of lip, oral cavity, and pharynx: Secondary | ICD-10-CM | POA: Diagnosis not present

## 2021-12-29 DIAGNOSIS — Z7984 Long term (current) use of oral hypoglycemic drugs: Secondary | ICD-10-CM | POA: Diagnosis not present

## 2021-12-29 DIAGNOSIS — Z7982 Long term (current) use of aspirin: Secondary | ICD-10-CM | POA: Diagnosis not present

## 2021-12-29 DIAGNOSIS — Z8546 Personal history of malignant neoplasm of prostate: Secondary | ICD-10-CM | POA: Diagnosis not present

## 2021-12-29 DIAGNOSIS — R079 Chest pain, unspecified: Secondary | ICD-10-CM | POA: Diagnosis present

## 2021-12-29 DIAGNOSIS — E119 Type 2 diabetes mellitus without complications: Secondary | ICD-10-CM | POA: Insufficient documentation

## 2021-12-29 DIAGNOSIS — Z79899 Other long term (current) drug therapy: Secondary | ICD-10-CM | POA: Diagnosis not present

## 2021-12-29 DIAGNOSIS — I10 Essential (primary) hypertension: Secondary | ICD-10-CM | POA: Insufficient documentation

## 2021-12-29 LAB — CBC
HCT: 46.2 % (ref 39.0–52.0)
Hemoglobin: 15.9 g/dL (ref 13.0–17.0)
MCH: 29.8 pg (ref 26.0–34.0)
MCHC: 34.4 g/dL (ref 30.0–36.0)
MCV: 86.5 fL (ref 80.0–100.0)
Platelets: 166 10*3/uL (ref 150–400)
RBC: 5.34 MIL/uL (ref 4.22–5.81)
RDW: 12.8 % (ref 11.5–15.5)
WBC: 3.5 10*3/uL — ABNORMAL LOW (ref 4.0–10.5)
nRBC: 0 % (ref 0.0–0.2)

## 2021-12-29 LAB — BASIC METABOLIC PANEL
Anion gap: 6 (ref 5–15)
BUN: 23 mg/dL (ref 8–23)
CO2: 27 mmol/L (ref 22–32)
Calcium: 9 mg/dL (ref 8.9–10.3)
Chloride: 103 mmol/L (ref 98–111)
Creatinine, Ser: 1.08 mg/dL (ref 0.61–1.24)
GFR, Estimated: >60 mL/min (ref >60)
Glucose, Bld: 159 mg/dL — ABNORMAL HIGH (ref 70–99)
Potassium: 4.2 mmol/L (ref 3.5–5.1)
Sodium: 136 mmol/L (ref 135–145)

## 2021-12-29 LAB — TROPONIN I (HIGH SENSITIVITY)
Troponin I (High Sensitivity): 19 ng/L — ABNORMAL HIGH (ref <18)
Troponin I (High Sensitivity): 17 ng/L (ref <18)

## 2021-12-29 LAB — BRAIN NATRIURETIC PEPTIDE: B Natriuretic Peptide: 21.9 pg/mL (ref 0.0–100.0)

## 2021-12-29 MED ORDER — NITROGLYCERIN 0.4 MG SL SUBL
0.4000 mg | SUBLINGUAL_TABLET | SUBLINGUAL | Status: DC | PRN
  Administered 2021-12-29 (×2): 0.4 mg via SUBLINGUAL
  Filled 2021-12-29: qty 1

## 2021-12-29 MED ORDER — NITROGLYCERIN 0.4 MG SL SUBL
0.4000 mg | SUBLINGUAL_TABLET | SUBLINGUAL | Status: AC
  Administered 2021-12-29: 0.4 mg via SUBLINGUAL

## 2021-12-29 MED ORDER — AMLODIPINE BESYLATE 5 MG PO TABS: 5.0000 mg | ORAL_TABLET | Freq: Once | ORAL | Status: DC

## 2021-12-29 MED ORDER — ASPIRIN 325 MG PO TABS
325.0000 mg | ORAL_TABLET | Freq: Every day | ORAL | Status: DC
  Administered 2021-12-29: 325 mg via ORAL
  Filled 2021-12-29: qty 1

## 2021-12-29 NOTE — ED Provider Triage Note (Signed)
Emergency Medicine Provider Triage Evaluation Note ? ?Thomas Bowman , a 71 y.o. male  was evaluated in triage.  Pt complains of chest pain for 3 weeks.  Patient reports chest pain is constant, located in the center/right of his chest.  Patient describes the pain as a "soreness".  Patient states he has a cardiac history, was seen by cardiology 1 year ago for suspected blockage.  Patient states he had a procedure showing no blockage.  Patient states since seeing cardiology he has had intermittent episodes of chest pain.  Patient reports being seen at P[facility] yesterday for chest pain, having abnormal lab result which she cannot specify.  Patient reports chest pain is nonexertional, nonradiating.  Patient states he has hypertension as well as diabetes.  Patient reports being somewhat compliant on medications for these 2 diagnoses. ? ?Review of Systems  ?Positive: Chest pain ?Negative: Shortness of breath, nausea, vomiting, diaphoresis, abdominal pain, headache ? ?Physical Exam  ?BP (!) 191/99 (BP Location: Left Arm)   Pulse 60   Temp 98.1 ?F (36.7 ?C) (Oral)   Resp 16   Ht 5' 6.5" (1.689 m)   Wt 78.9 kg   SpO2 99%   BMI 27.66 kg/m?  ?Gen:   Awake, no distress   ?Resp:  Normal effort  ?MSK:   Moves extremities without difficulty  ?Other:  Lungs clear.  Negative Levine sign ? ?Medical Decision Making  ?Medically screening exam initiated at 4:03 PM.  Appropriate orders placed.  Thomas Bowman was informed that the remainder of the evaluation will be completed by another provider, this initial triage assessment does not replace that evaluation, and the importance of remaining in the ED until their evaluation is complete. ? ? ?  ?Azucena Cecil, PA-C ?12/29/21 1605 ? ?

## 2021-12-29 NOTE — Discharge Instructions (Addendum)
Return to ED with any new symptoms such as shortness of breath, chest pain, lower extremity swelling ?Please follow-up with your cardiologist this week.  You will need to call her to make an appointment.  It is important that you see your cardiologist and get back on medications for your chest pain ?Please begin taking the medications that your doctors prescribed as directed.  It is important for you to be on the medications that you are prescribed. ?

## 2021-12-29 NOTE — ED Triage Notes (Signed)
Patient c/o "steady" mid chest pain x 3 weeks. Patient denies SOB, N/v, or diaphoresis. Patient states his chest "feels sore." Patient reports that the chest pain radiates into his jaw "some." ?

## 2021-12-29 NOTE — ED Provider Notes (Signed)
[facility] DEPT Provider Note   CSN: [id number] Arrival date & time: 12/29/21  1552     History  Chief Complaint  Patient presents with   Chest Pain    Thomas Bowman is a 71 y.o. male with medical history significant for jaw cancer, diabetes, hypertension, prostate cancer, hyperlipidemia.  Patient presents to ED complaining of chest pain. Pt complains of chest pain for 3 weeks.  Patient reports chest pain is constant, located in the center/right of his chest.  Patient describes the pain as a "soreness".  Patient states he has a cardiac history, was seen by cardiology 1 year ago for suspected blockage.  Patient states he had a procedure showing no blockage.  Patient states since seeing cardiology he has had intermittent episodes of chest pain.  Patient reports being seen at PCP yesterday for chest pain, having abnormal lab result which he cannot specify.  Patient reports chest pain is nonexertional, nonradiating.  Patient states he has hypertension as well as diabetes.  Patient reports being somewhat compliant on medications for these 2 diagnoses.  On examination today, the patient only complaining of chest pain.  The patient denies nausea, vomiting, diaphoresis, shortness of breath, headache, blurred vision, back pain, lightheadedness, dizziness, weakness.  Of note, this patient told this provider that he is noncompliant on majority of his medications.  The patient reports that he "does not take medications every day".  The patient reports that he believes that "when he started taking medications you just trade 1 problem for another".  Cardiology was to begin this patient on 4 medications for his chest discomfort back in September however the patient states that he took 1 dose of Cardizem, passed out and has not taken any since.  He was seen by [facility] cardiology in November who also recommended starting patient on more medications for his chest pain.  The patient reports  being noncompliant.   Chest Pain Associated symptoms: no abdominal pain, no back pain, no diaphoresis, no dizziness, no headache, no nausea, no shortness of breath, no vomiting and no weakness       Home Medications Prior to Admission medications   Medication Sig Start Date End Date Taking? Authorizing Provider  amLODipine (NORVASC) 5 MG tablet  12/29/10   [provider]  aspirin 81 MG tablet Take 81 mg by mouth daily.    [provider]  atorvastatin (LIPITOR) 20 MG tablet Take by mouth. 12/03/18   [provider]  diclofenac sodium (VOLTAREN) 1 % GEL Apply 4 g topically 4 (four) times daily. 06/12/18   Marcial Pacas, MD  empagliflozin (JARDIANCE) 10 MG TABS tablet Take 10 mg by mouth daily.    [provider]  finasteride (PROSCAR) 5 MG tablet Take 5 mg by mouth at bedtime. 04/11/21   [provider]  glucose blood (TRUE METRIX PRO BLOOD GLUCOSE) test strip 1 each by Other route as needed for other. Use as instructed    [provider]  Lancets Thin MISC by Does not apply route. 23 gauge    [provider]  lisinopril (PRINIVIL,ZESTRIL) 40 MG tablet TAKE ONE TABLET BY MOUTH ONCE DAILY 08/22/16   Imogene Burn, PA-C  metFORMIN (GLUCOPHAGE) 1000 MG tablet Take by mouth. 02/16/19   [provider]  metFORMIN (GLUCOPHAGE) 500 MG tablet Take 500 mg by mouth at bedtime. 02/16/19   [provider]  metoprolol succinate (TOPROL XL) 25 MG 24 hr tablet Take 1 tablet (25 mg total)  by mouth daily. 08/16/21   Sueanne Margarita, MD      Allergies    Hydralazine hcl and Sulfa antibiotics    Review of Systems   Review of Systems  Constitutional:  Negative for diaphoresis.  Eyes:  Negative for visual disturbance.  Respiratory:  Negative for shortness of breath.   Cardiovascular:  Positive for chest pain.  Gastrointestinal:  Negative for abdominal pain, nausea and vomiting.  Musculoskeletal:  Negative for back pain.   Neurological:  Negative for dizziness, weakness, light-headedness and headaches.   Physical Exam Updated Vital Signs BP (!) 142/81    Pulse (!) 54    Temp 98.1 F (36.7 C) (Oral)    Resp 20    Ht 5' 6.5" (1.689 m)    Wt 78.9 kg    SpO2 96%    BMI 27.66 kg/m  Physical Exam Vitals and nursing note reviewed.  Constitutional:      General: He is not in acute distress.    Appearance: He is not ill-appearing, toxic-appearing or diaphoretic.  HENT:     Head: Normocephalic and atraumatic.     Nose: Nose normal.     Mouth/Throat:     Mouth: Mucous membranes are moist.     Pharynx: Oropharynx is clear.  Eyes:     Extraocular Movements: Extraocular movements intact.     Pupils: Pupils are equal, round, and reactive to light.  Neck:     Vascular: No JVD.  Cardiovascular:     Rate and Rhythm: Normal rate and regular rhythm.  Pulmonary:     Effort: Pulmonary effort is normal.     Breath sounds: No decreased breath sounds, wheezing, rhonchi or rales.  Chest:     Chest wall: No mass, deformity, tenderness or crepitus.  Abdominal:     General: Bowel sounds are normal.     Palpations: Abdomen is soft.     Tenderness: There is no abdominal tenderness.  Musculoskeletal:        General: Normal range of motion.     Cervical back: Normal range of motion.     Right lower leg: No tenderness. No edema.     Left lower leg: No tenderness. No edema.  Skin:    General: Skin is warm and dry.     Capillary Refill: Capillary refill takes less than 2 seconds.  Neurological:     Mental Status: He is alert and oriented to person, place, and time.     GCS: GCS eye subscore is 4. GCS verbal subscore is 5. GCS motor subscore is 6.     Cranial Nerves: Cranial nerves 2-12 are intact. No dysarthria.     Sensory: Sensation is intact. No sensory deficit.     Motor: Motor function is intact. No weakness.     Coordination: Coordination is intact. Heel to [facility] Test normal.    ED Results / Procedures /  Treatments   Labs (all labs ordered are listed, but only abnormal results are displayed) Labs Reviewed  BASIC METABOLIC PANEL - Abnormal; Notable for the following components:      Result Value   Glucose, Bld 159 (*)    All other components within normal limits  CBC - Abnormal; Notable for the following components:   WBC 3.5 (*)    All other components within normal limits  TROPONIN I (HIGH SENSITIVITY) - Abnormal; Notable for the following components:   Troponin I (High Sensitivity) 19 (*)    All other components within normal  limits  BRAIN NATRIURETIC PEPTIDE  TROPONIN I (HIGH SENSITIVITY)    EKG EKG Interpretation  Date/Time:  Friday December 29 2021 17:31:30 EST Ventricular Rate:  68 PR Interval:  157 QRS Duration: 90 QT Interval:  398 QTC Calculation: 424 R Axis:   61 Text Interpretation: Sinus rhythm Consider right atrial enlargement no acute ST/T changes similar to June 2022 Confirmed by Sherwood Gambler [phone]) on 12/29/2021 5:37:51 PM  Radiology DG Chest Port 1 View  Result Date: 12/29/2021 CLINICAL DATA:  cp EXAM: PORTABLE CHEST 1 VIEW COMPARISON:  Chest x-ray 02/16/2021, CT heart 04/14/2021 FINDINGS: The heart and mediastinal contours are unchanged. No focal consolidation. No pulmonary edema. No pleural effusion. No pneumothorax. No acute osseous abnormality. IMPRESSION: No active disease. Electronically Signed   By: Iven Finn M.D.   On: 12/29/2021 16:48    Procedures Procedures    Medications Ordered in ED Medications  aspirin tablet 325 mg (325 mg Oral Given 12/29/21 1640)  nitroGLYCERIN (NITROSTAT) SL tablet 0.4 mg (0.4 mg Sublingual Given 12/29/21 1727)    ED Course/ Medical Decision Making/ A&P                           Medical Decision Making Amount and/or Complexity of Data Reviewed Labs: ordered. Radiology: ordered.  Risk OTC drugs. Prescription drug management.   71 year old male presents to ED for evaluation of chest pain.  Please see HPI  for further details. On examination, the patient is afebrile, nontachycardic, not hypoxic, clear lung sounds bilaterally, soft compressible abdomen.  Patient neuro exam shows no focal neurodeficits.  Negative Levine sign.  No JVD.  No lower extremity edema bilaterally.  Patient worked up utilizing the following labs and imaging studies interpreted by me personally: - BMP shows slightly elevated glucose 159, noncontributory to symptoms - CBC shows decrease white blood cell count of 3.5.  On chart review, this is patient baseline - BNP unremarkable - Troponin initially 19, delta troponin down two.  Secondary troponin resulted as 17 - Chest x-ray shows no signs of cardiomegaly, vascular congestion, mediastinal widening, consolidation, effusion - EKG is sinus rhythm  Patient treated utilizing following medications: 0.4 mg sublingual nitroglycerin  Patient reports after administration of nitroglycerin his chest pain has resolved.  Dr. Chancy Milroy of cardiology consulted for further expertise.  Dr. Chancy Milroy states that this patient is fine to be discharged home with close outpatient follow-up with his cardiologist.  At this time, after discussion with my attending Dr. Regenia Skeeter, we feel this patient is stable for discharge home.  Patient will be provided with return precautions and he has voiced understanding.  Patient questions answered to his satisfaction.  Patient stable at this time for discharge home.   Final Clinical Impression(s) / ED Diagnoses Final diagnoses:  Chest pain, unspecified type    Rx / DC Orders ED Discharge Orders     None         Lawana Chambers 12/29/21 2026    Sherwood Gambler, MD 12/29/21 2232

## 2022-01-10 ENCOUNTER — Other Ambulatory Visit: Payer: Self-pay

## 2022-01-10 ENCOUNTER — Ambulatory Visit: Payer: Medicare Other | Admitting: Cardiology

## 2022-01-10 NOTE — Progress Notes (Deleted)
? ?Office Visit  ?  ?Patient Name: Thomas Bowman ?Date of Encounter: 01/10/2022 ? ?PCP:  [facility] ?  ?[place]  ?Cardiologist:  Fransico Him, MD  ?Advanced Practice Provider:  No care team member to display ?Electrophysiologist:  None  ? ?Chief Complaint  ?  ?Thomas Bowman is a 71 y.o. male with a hx of Dm2, HLD, HTN, OSA (not on CPAP, cardiac murmur, coronary arteries with microvascular dysfunction presents today for follow up. ? ?He is here today for followup and is doing well.  He denies any chest pain or pressure, SOB, DOE, PND, orthopnea, LE edema, dizziness, palpitations or syncope. He is compliant with her meds and is tolerating meds with no SE.    ? ?Past Medical History  ?  ?Past Medical History:  ?Diagnosis Date  ? Bone cancer ([place]) 90s  ? DM (diabetes mellitus) ([place])   ? History of bone cancer   ? HLD (hyperlipidemia)   ? Hypertension   ? Leg pain   ? Muscle strain of shoulder region   ? OSA (obstructive sleep apnea)   ? Overweight   ? Prostate cancer ([place])   ? PSA elevation   ? Shoulder pain   ? ?Past Surgical History:  ?Procedure Laterality Date  ? APPENDECTOMY    ? FACIAL NERVE SURGERY Right   ? INTRAVASCULAR PRESSURE WIRE/FFR STUDY N/A 04/18/2021  ? Procedure: INTRAVASCULAR PRESSURE WIRE/FFR STUDY;  Surgeon: Belva Crome, MD;  Location: [facility];  Service: Cardiovascular;  Laterality: N/A;  ? LEFT HEART CATH AND CORONARY ANGIOGRAPHY N/A 04/18/2021  ? Procedure: LEFT HEART CATH AND CORONARY ANGIOGRAPHY;  Surgeon: Belva Crome, MD;  Location: [facility];  Service: Cardiovascular;  Laterality: N/A;  ? PROSTATE BIOPSY    ? ? ?Allergies ? ?Allergies  ?Allergen Reactions  ? Hydralazine Hcl   ?  Pt experienced severe cramps and aches in hands and legs when dose increased from 25 TID to 50 TID. Resolved upon discontinuation of drug.  ? Sulfa Antibiotics Rash  ? ? ?History of Present Illness  ?  ?Thomas Bowman is a 71 y.o. male with a hx of  Dm2, HLD, HTN, OSA (not on CPAP, cardiac murmur, coronary arteries with microvascular dysfunction last seen for cardiac catheterization 04/18/21. ? ?He initially was seen by me for  exertional chest discomfort. Coronary CTA with concern for high grade stenosis in proximal RCA with abnormal FFR.  ? ?Left heart catheterization 04/18/21 with normal epicardial coronary arteries with right dominant anatomy. LVEF >50%. Abnormal comprehensive microvascular analysis wit CFR 1.5 (normal >2) and index of microvascular resistance (IMR) or 38 (normal less than 25). Recommended to consider negative inotropic CCB and renin angiotensin blockage or consider addition of beta blocker (though not alongside negative inotropic calcium blocker). Also recommended consideration of long acting nitrates.  ? ?Recommendations were for initial therapy with diltiazem. If not at maximum benefit and if HR will allow, add beta blocker. Add Imdur as last agent as given his particular microvascular dysfunction it was felt that CCB with possible BB and continuation of ACE will be most effective. ? ?He was seen by the PA recently has changed from Amlodipine to Cardizem CD '240mg'$  daily.  He says that 3 days after starting the medication he was sitting on the back of his truck talking and got very hot and then passed.  He says that his friend got a wet towel and placed  it on his head. He refused for anyone to call EMS.  He stopped taking the Cardizem.   ? ?He is here today for followup.  He denies any chest pain or pressure, SOB, DOE, PND, orthopnea, LE edema, dizziness, palpitations. He is compliant with his meds and is tolerating meds with no SE.    ? ?EKGs/Labs/Other Studies Reviewed:  ? ?The following studies were reviewed today: ? ?LHC 04/18/21 ? ?Normal epicardial coronary arteries with right dominant anatomy. ?LV function with EF greater than 50%.  EDP less than 15 mmHg. ?Abnormal comprehensive microvascular analysis with CFR 1.5 (normal greater than  2.0) and index of microvascular resistance (IMR) of 38 (normal less than 25). ?  ?RECOMMENDATIONS: ?  ?No epicardial coronary disease is noted.  There was no ostial right coronary disease as suspected from the CT. ?Microvascular physiologic analysis suggest MICROVASCULAR DYSFUNCTION as a possible explanation for the patient's exertional angina. ?Consider negative inotropic calcium channel blocker and renin angiotensin blockade.  Addition of beta-blocker therapy may be helpful however would not mix with a negative inotropic calcium blocker.  Addition of long-acting nitrates may also be helpful.  Overall, management may include multiple medication changes and combinations to find the correct combination. ? ?Echo 12/27/20 ?1. Left ventricular ejection fraction, by estimation, is 55 to 60%. Left  ?ventricular ejection fraction by 3D volume is 55 %. The left ventricle has  ?normal function. The left ventricle has no regional wall motion  ?abnormalities. Left ventricular diastolic  ? parameters are indeterminate. The average left ventricular global  ?longitudinal strain is -22.3 %. The global longitudinal strain is normal.  ? 2. Right ventricular systolic function is normal. The right ventricular  ?size is normal. There is normal pulmonary artery systolic pressure. The  ?estimated right ventricular systolic pressure is 79.3 mmHg.  ? 3. The mitral valve is grossly normal. Trivial mitral valve  ?regurgitation. No evidence of mitral stenosis.  ? 4. The aortic valve is tricuspid. There is mild calcification of the  ?aortic valve. Aortic valve regurgitation is not visualized. No aortic  ?stenosis is present.  ? 5. The inferior vena cava is normal in size with greater than 50%  ?respiratory variability, suggesting right atrial pressure of 3 mmHg.  ? ?Conclusion(s)/Recommendation(s): Normal biventricular function without  ?evidence of hemodynamically significant valvular heart disease.  ?EKG:  No EKG is  ordered today.   ? ?Recent  Labs: ?12/29/2021: B Natriuretic Peptide 21.9; BUN 23; Creatinine, Ser 1.08; Hemoglobin 15.9; Platelets 166; Potassium 4.2; Sodium 136  ?Recent Lipid Panel ?   ?Component Value Date/Time  ? CHOL 181 11/29/2015 1425  ? TRIG 135 11/29/2015 1425  ? HDL 47 11/29/2015 1425  ? CHOLHDL 3.9 11/29/2015 1425  ? VLDL 27 11/29/2015 1425  ? [place] 107 11/29/2015 1425  ? ?Home Medications  ? ?No outpatient medications have been marked as taking for the 01/10/22 encounter (Appointment) with Sueanne Margarita, MD.  ?  ? ?Review of Systems  ? ?All other systems reviewed and are otherwise negative except as noted above. ? ?Physical Exam  ?  ?VS:  There were no vitals taken for this visit. , BMI There is no height or weight on file to calculate BMI. ? ?Wt Readings from Last 3 Encounters:  ?12/29/21 174 lb (78.9 kg)  ?07/10/21 176 lb 3.2 oz (79.9 kg)  ?04/21/21 174 lb 12.8 oz (79.3 kg)  ?  ?GEN: Well nourished, well developed in no acute distress ?HEENT: Normal ?NECK: No JVD; No carotid bruits ?  LYMPHATICS: No lymphadenopathy ?CARDIAC:RRR, no murmurs, rubs, gallops ?RESPIRATORY:  Clear to auscultation without rales, wheezing or rhonchi  ?ABDOMEN: Soft, non-tender, non-distended ?MUSCULOSKELETAL:  No edema; No deformity  ?SKIN: Warm and dry ?NEUROLOGIC:  Alert and oriented x 3 ?PSYCHIATRIC:  Normal affect   ?Assessment & Plan  ?  ?Microvascular angina ?-coronary CTA was suggestive of RCA disease.   ?-Cardiac catheterization with no coronary artery calcification.  Did note microvascular angina.   ?-Plan to optimize medical therapy for INCOA.  ?-He has not had any anginal symptoms since I saw her last ?-Continue prescription drug management with aspirin 81 mg daily, amlodipine 5 mg daily and Toprol-XL 25 mg daily with as needed refills as well as statin therapy ? ?Cardiac murmur  ?- Echo 12/2020 with normal LVEF and no significant valvular abnormality. ? ?HTN  ?-BP is controlled on exam today ?-Continue prescription drug management with  amlodipine 5 mg daily, lisinopril 40 mg daily and metoprolol succinate 25 mg daily with as needed refills ?-I have personally reviewed and interpreted outside labs performed by patient's PCP which showed

## 2022-01-17 ENCOUNTER — Other Ambulatory Visit: Payer: Self-pay | Admitting: Student

## 2022-01-17 DIAGNOSIS — R0989 Other specified symptoms and signs involving the circulatory and respiratory systems: Secondary | ICD-10-CM

## 2022-01-19 ENCOUNTER — Ambulatory Visit
Admission: RE | Admit: 2022-01-19 | Discharge: 2022-01-19 | Disposition: A | Discharge status: Home / Self Care | Payer: Medicare Other | Source: Ambulatory Visit | Attending: Student | Admitting: Student

## 2022-01-19 ENCOUNTER — Other Ambulatory Visit: Payer: Self-pay | Admitting: Student

## 2022-01-19 DIAGNOSIS — R0989 Other specified symptoms and signs involving the circulatory and respiratory systems: Secondary | ICD-10-CM

## 2022-03-06 DIAGNOSIS — K219 Gastro-esophageal reflux disease without esophagitis: Secondary | ICD-10-CM | POA: Insufficient documentation

## 2022-03-06 DIAGNOSIS — I252 Old myocardial infarction: Secondary | ICD-10-CM | POA: Insufficient documentation

## 2022-03-06 DIAGNOSIS — N400 Enlarged prostate without lower urinary tract symptoms: Secondary | ICD-10-CM | POA: Insufficient documentation

## 2022-03-06 DIAGNOSIS — E663 Overweight: Secondary | ICD-10-CM | POA: Insufficient documentation

## 2022-04-13 ENCOUNTER — Encounter: Payer: Self-pay | Admitting: Podiatry

## 2022-04-13 ENCOUNTER — Ambulatory Visit (INDEPENDENT_AMBULATORY_CARE_PROVIDER_SITE_OTHER): Payer: Medicare Other | Admitting: Podiatry

## 2022-04-13 DIAGNOSIS — B353 Tinea pedis: Secondary | ICD-10-CM

## 2022-04-13 DIAGNOSIS — M79674 Pain in right toe(s): Secondary | ICD-10-CM

## 2022-04-13 DIAGNOSIS — B351 Tinea unguium: Secondary | ICD-10-CM

## 2022-04-13 DIAGNOSIS — M79675 Pain in left toe(s): Secondary | ICD-10-CM

## 2022-04-13 MED ORDER — CICLOPIROX OLAMINE 0.77 % EX CREA: TOPICAL_CREAM | CUTANEOUS | 1 refills | Status: AC

## 2022-04-23 NOTE — Progress Notes (Signed)
  Subjective:  Patient ID: Thomas Bowman, male    DOB: 1951/06/13,  MRN: [id number]  Thomas Bowman presents to clinic today for preventative diabetic foot care and painful elongated mycotic toenails 1-5 bilaterally which are tender when wearing enclosed shoe gear. Pain is relieved with periodic professional debridement.   Last known HgA1c was unknown.  Patient did not check blood glucose today.  New problem(s): None.   PCP is Cipriano Mile, NP , and last visit was 3 months ago.  Allergies  Allergen Reactions   Hydralazine Hcl     Pt experienced severe cramps and aches in hands and legs when dose increased from 25 TID to 50 TID. Resolved upon discontinuation of drug.   Sulfa Antibiotics Rash   Review of Systems: Negative except as noted in the HPI.  Objective: No changes noted in today's physical examination.  Vascular Examination: Vascular status intact b/l with palpable pedal pulses. Pedal hair present b/l. CFT immediate b/l. No pain with calf compression b/l. Skin temperature gradient WNL b/l. Trace edema noted BLE.  Neurological Examination: Sensation grossly intact b/l with 10 gram monofilament. Vibratory sensation intact b/l.   Dermatological Examination: Pedal skin with normal turgor, texture and tone b/l. Toenails 1-5 b/l thick, discolored, elongated with subungual debris and pain on dorsal palpation. No hyperkeratotic lesions noted b/l. No open wounds b/l LE. Diffuse scaling noted peripherally and plantarly b/l feet.  Mild foot odor. No interdigital macerations.  No blisters, no weeping. No signs of secondary bacterial infection noted.  Musculoskeletal Examination: Muscle strength 5/5 to b/l LE. No pain, crepitus or joint limitation noted with ROM bilateral LE. HAV with bunion deformity noted b/l LE.  Radiographs: None Assessment/Plan: 1. Pain due to onychomycosis of toenails of both feet   2. Tinea pedis of both feet     -Patient was evaluated and treated. All patient's  and/or POA's questions/concerns answered on today's visit. -Patient to continue soft, supportive shoe gear daily. -Mycotic toenails 1-5 bilaterally were debrided in length and girth with sterile nail nippers and dremel without incident. -For tinea pedis, Rx sent to pharmacy for Ciclopirox Cream 0.77% to be applied to both feet and between toes twice daily for 6 weeks. -Patient/POA to call should there be question/concern in the interim.   Return in about 3 months (around 07/14/2022).  Marzetta Board, DPM

## 2022-05-18 ENCOUNTER — Emergency Department (HOSPITAL_COMMUNITY): Payer: Medicare Other

## 2022-05-18 ENCOUNTER — Emergency Department (HOSPITAL_COMMUNITY)
Admission: EM | Admit: 2022-05-18 | Discharge: 2022-05-18 | Disposition: A | Discharge status: Home / Self Care | Payer: Medicare Other | Attending: Emergency Medicine | Admitting: Emergency Medicine

## 2022-05-18 ENCOUNTER — Encounter (HOSPITAL_COMMUNITY): Payer: Self-pay

## 2022-05-18 DIAGNOSIS — Z85830 Personal history of malignant neoplasm of bone: Secondary | ICD-10-CM | POA: Diagnosis not present

## 2022-05-18 DIAGNOSIS — E119 Type 2 diabetes mellitus without complications: Secondary | ICD-10-CM | POA: Insufficient documentation

## 2022-05-18 DIAGNOSIS — I2510 Atherosclerotic heart disease of native coronary artery without angina pectoris: Secondary | ICD-10-CM | POA: Insufficient documentation

## 2022-05-18 DIAGNOSIS — Z7982 Long term (current) use of aspirin: Secondary | ICD-10-CM | POA: Diagnosis not present

## 2022-05-18 DIAGNOSIS — I10 Essential (primary) hypertension: Secondary | ICD-10-CM | POA: Insufficient documentation

## 2022-05-18 DIAGNOSIS — Z7984 Long term (current) use of oral hypoglycemic drugs: Secondary | ICD-10-CM | POA: Diagnosis not present

## 2022-05-18 DIAGNOSIS — Z79899 Other long term (current) drug therapy: Secondary | ICD-10-CM | POA: Insufficient documentation

## 2022-05-18 DIAGNOSIS — R072 Precordial pain: Secondary | ICD-10-CM

## 2022-05-18 DIAGNOSIS — Z8546 Personal history of malignant neoplasm of prostate: Secondary | ICD-10-CM | POA: Insufficient documentation

## 2022-05-18 LAB — COMPREHENSIVE METABOLIC PANEL
ALT: 16 U/L (ref 0–44)
AST: 24 U/L (ref 15–41)
Albumin: 3.7 g/dL (ref 3.5–5.0)
Alkaline Phosphatase: 78 U/L (ref 38–126)
Anion gap: 12 (ref 5–15)
BUN: 16 mg/dL (ref 8–23)
CO2: 21 mmol/L — ABNORMAL LOW (ref 22–32)
Calcium: 8.7 mg/dL — ABNORMAL LOW (ref 8.9–10.3)
Chloride: 100 mmol/L (ref 98–111)
Creatinine, Ser: 1.48 mg/dL — ABNORMAL HIGH (ref 0.61–1.24)
GFR, Estimated: 51 mL/min — ABNORMAL LOW (ref >60)
Glucose, Bld: 426 mg/dL — ABNORMAL HIGH (ref 70–99)
Potassium: 4.3 mmol/L (ref 3.5–5.1)
Sodium: 133 mmol/L — ABNORMAL LOW (ref 135–145)
Total Bilirubin: 0.8 mg/dL (ref 0.3–1.2)
Total Protein: 6.8 g/dL (ref 6.5–8.1)

## 2022-05-18 LAB — CBC
HCT: 42.4 % (ref 39.0–52.0)
Hemoglobin: 14.4 g/dL (ref 13.0–17.0)
MCH: 29 pg (ref 26.0–34.0)
MCHC: 34 g/dL (ref 30.0–36.0)
MCV: 85.5 fL (ref 80.0–100.0)
Platelets: 164 10*3/uL (ref 150–400)
RBC: 4.96 MIL/uL (ref 4.22–5.81)
RDW: 12.3 % (ref 11.5–15.5)
WBC: 4.6 10*3/uL (ref 4.0–10.5)
nRBC: 0 % (ref 0.0–0.2)

## 2022-05-18 LAB — TROPONIN I (HIGH SENSITIVITY)
Troponin I (High Sensitivity): 20 ng/L — ABNORMAL HIGH (ref <18)
Troponin I (High Sensitivity): 22 ng/L — ABNORMAL HIGH (ref <18)

## 2022-05-18 MED ORDER — LACTATED RINGERS IV BOLUS
1000.0000 mL | Freq: Once | INTRAVENOUS | Status: AC
  Administered 2022-05-18: 1000 mL via INTRAVENOUS

## 2022-05-18 MED ORDER — ACETAMINOPHEN 325 MG PO TABS
650.0000 mg | ORAL_TABLET | Freq: Once | ORAL | Status: AC
  Administered 2022-05-18: 650 mg via ORAL
  Filled 2022-05-18: qty 2

## 2022-05-18 NOTE — ED Triage Notes (Signed)
Here for right sided that started at 6AM radiating to right arm. Also reports palpitations and headache. Thomas Bowman out of town since Tuesday and has not taken his meds since then. Alert and oriented x 4.

## 2022-05-18 NOTE — Discharge Instructions (Signed)
You were seen in the emergency department for evaluation of right-sided chest discomfort.  Evaluation today did not reveal any evidence of heart attack or other concerning findings.  We recommend following up closely with your primary care provider and taking all of your home prescribed medications.

## 2022-05-18 NOTE — ED Provider Notes (Signed)
Physical Exam  BP (!) 141/71   Pulse 70   Temp 98 F (36.7 C) (Oral)   Resp 18   Ht '5\' 6"'$  (1.676 m)   Wt 127 kg   SpO2 97%   BMI 45.19 kg/m   Physical Exam Vitals and nursing note reviewed.  Constitutional:      General: He is not in acute distress.    Appearance: He is well-developed. He is not toxic-appearing.  HENT:     Head: Atraumatic.     Comments: Evidence of prior jaw surgery on right Eyes:     Extraocular Movements: Extraocular movements intact.     Conjunctiva/sclera: Conjunctivae normal.     Pupils: Pupils are equal, round, and reactive to light.  Neck:     Vascular: No JVD.  Cardiovascular:     Rate and Rhythm: Normal rate and regular rhythm.     Pulses:          Carotid pulses are 2+ on the right side and 2+ on the left side.      Radial pulses are 2+ on the right side and 2+ on the left side.       Dorsalis pedis pulses are 2+ on the right side and 2+ on the left side.       Posterior tibial pulses are 2+ on the right side and 2+ on the left side.     Heart sounds: Normal heart sounds. No murmur heard. Pulmonary:     Effort: Pulmonary effort is normal. No respiratory distress.     Breath sounds: Normal breath sounds. No decreased breath sounds, wheezing, rhonchi or rales.  Abdominal:     Palpations: Abdomen is soft.     Tenderness: There is no abdominal tenderness. There is no guarding or rebound.  Musculoskeletal:        General: No swelling.     Cervical back: Neck supple.     Right lower leg: No tenderness. No edema.     Left lower leg: No tenderness. No edema.  Skin:    General: Skin is warm and dry.     Capillary Refill: Capillary refill takes less than 2 seconds.     Findings: No rash.  Neurological:     General: No focal deficit present.     Mental Status: He is alert and oriented to person, place, and time.     Motor: No weakness.  Psychiatric:        Mood and Affect: Mood normal.     Procedures  Procedures  ED Course / MDM     Medical Decision Making Problems Addressed: Precordial pain: complicated acute illness or injury with systemic symptoms  Amount and/or Complexity of Data Reviewed External Data Reviewed: labs, radiology, ECG and notes. Labs: ordered. Radiology: ordered. ECG/medicine tests: ordered and independent interpretation performed.    Details: EKG with rate 78, QTc 426.  Normal intervals.  Normal axis.  Evidence of possible left ventricular hypertrophy with deep S waves in V2, V3 and V4.  No acute ST or T wave abnormalities to suggest underlying ACS.  Risk OTC drugs.   Patient is a 71 year old male with a history of microvascular angina diagnosed via [place] on 03/2021, HLD, HTN, prostate cancer, mandibular bone cancer resection/radiation, medication noncompliance who presented to the emergency department in the setting of right-sided chest pain which onset around 6 AM today.  The patient was not doing anything exertional at the time of his symptom onset and he denies associated shortness  of breath, emesis or diaphoresis.  He admits that he has not taken his medications for several days in the setting of recently being out of town.  Previous provider had ordered baseline laboratory work and imaging studies.  CBC without evidence of leukocytosis or anemia.  Chest x-ray without acute cardiopulmonary abnormalities.  CMP with slight hyponatremia at 133.  Creatinine elevated from his baseline of 1.1 currently at 1.48.  Delta troponin was 17 and 20 respectively.  On re-evaluation patient was feeling improved. He was given a liter of LR in the setting of his slight AKI. Patient was felt appropriate for discharge and further management in the outpatient setting. He was instructed on appropriate home medication usage and was told to follow closely with his PCP and cardiologist. Plan and findings discussed with patient at bedside and he verbalized understanding and was agreeable. Patient discharged from the ED in stable  condition.       Nelta Numbers, MD 05/19/22 1427    Blanchie Dessert, MD 05/21/22 [phone]

## 2022-05-18 NOTE — ED Provider Notes (Signed)
[name] EMERGENCY DEPARTMENT Provider Note   CSN: [id number] Arrival date & time: 05/18/22  1355     History  Chief Complaint  Patient presents with   Chest Pain    Thomas Bowman is a 71 y.o. male.  Patient with history of coronary disease with history microvascular angina, type 2 diabetes, hyperlipidemia, hypertension, prostate cancer, jaw bone cancer resection and radiation, medication non-compliance -- presents to the emergency department for evaluation of right-sided chest pain with radiation to the right axillary area.  Symptoms started around 6 AM today.  Patient states that he was having a car towed when the symptoms started, but was not doing anything particularly exertional.  No associated shortness of breath, vomiting, diaphoresis.  He denies abdominal pain, diarrhea.  He admits to being out of town for several days and not taking his medications.  Did take aspirin today.  C/o HA and palpitations.  Patient denies risk factors for pulmonary embolism including: unilateral leg swelling, history of DVT/PE/other blood clots, use of exogenous hormones, recent immobilizations, recent surgery, recent travel (>4hr segment), hemoptysis.   Cardiology note from [facility] 02/2022: Pt on amlodipine and lisinopril for BP control, encouraged to focus on compliance with amlodipine due to aversion to taking medications. No mention of current beta blocker in that note.   [place] Cath 03/2021: Normal epicardial coronary arteries with right dominant anatomy. LV function with EF greater than 50%. EDP less than 15 mmHg. Abnormal comprehensive microvascular analysis with CFR 1.5 (normal greater than 2.0) and index of microvascular resistance (IMR) of 38 (normal less than 25).        Home Medications Prior to Admission medications   Medication Sig Start Date End Date Taking? Authorizing Provider  amLODipine (NORVASC) 10 MG tablet amlodipine 10 mg tablet  TAKE ONE TABLET BY MOUTH DAILY AT  9AM. 11/02/19   [provider]  amLODipine (NORVASC) 5 MG tablet  12/29/10   [provider]  aspirin EC 81 MG tablet aspirin 81 mg tablet,delayed release  TAKE 1 TABLET BY MOUTH EVERY DAY    [provider]  atorvastatin (LIPITOR) 20 MG tablet Take by mouth. 12/03/18   [provider]  ciclopirox (LOPROX) 0.77 % cream Apply to both feet and between toes bid x 6 weeks. 04/13/22   Marzetta Board, DPM  clotrimazole (LOTRIMIN) 1 % cream  12/13/20   [provider]  cyclobenzaprine (FLEXERIL) 10 MG tablet Take 10 mg by mouth 2 (two) times daily as needed. 12/20/21   [provider]  diclofenac sodium (VOLTAREN) 1 % GEL Apply 4 g topically 4 (four) times daily. 06/12/18   Marcial Pacas, MD  diclofenac Sodium (VOLTAREN) 1 % GEL diclofenac 1 % topical gel  APPLY TO THE AFFECTED AREA(S) TWO GRAMS TOPICALLY FOUR TIMES DAILY AS NEEDED (BULK). 04/04/21   [provider]  empagliflozin (JARDIANCE) 10 MG TABS tablet Take 10 mg by mouth daily.    [provider]  finasteride (PROSCAR) 5 MG tablet Take 5 mg by mouth at bedtime. 04/11/21   [provider]  glucose blood (TRUE METRIX PRO BLOOD GLUCOSE) test strip 1 each by Other route as needed for other. Use as instructed    [provider]  Lancets Thin MISC by Does not apply route. 23 gauge    [provider]  lisinopril (PRINIVIL,ZESTRIL) 40 MG tablet TAKE ONE TABLET BY MOUTH ONCE DAILY 08/22/16   Imogene Burn, PA-C  metFORMIN (GLUCOPHAGE) 500 MG tablet  Take 500 mg by mouth at bedtime. 02/16/19   [provider]  metoprolol succinate (TOPROL XL) 25 MG 24 hr tablet Take 1 tablet (25 mg total) by mouth daily. 08/16/21   Sueanne Margarita, MD  metoprolol tartrate (LOPRESSOR) 100 MG tablet Take 100 mg by mouth daily. 12/20/21   [provider]  potassium chloride (KLOR-CON) 10 MEQ tablet take 1 tablet (10 meq) by oral route once daily for 30 days    [provider]      Allergies    Hydralazine hcl and Sulfa antibiotics    Review of Systems   Review of Systems  Physical Exam Updated Vital Signs Ht '5\' 6"'$  (1.676 m)   Wt 127 kg   BMI 45.19 kg/m   Physical Exam Vitals and nursing note reviewed.  Constitutional:      Appearance: He is well-developed. He is not diaphoretic.  HENT:     Head: Normocephalic and atraumatic.     Mouth/Throat:     Mouth: Mucous membranes are not dry.  Eyes:     Conjunctiva/sclera: Conjunctivae normal.  Neck:     Vascular: Normal carotid pulses. No carotid bruit or JVD.     Trachea: Trachea normal. No tracheal deviation.  Cardiovascular:     Rate and Rhythm: Normal rate and regular rhythm.     Pulses: No decreased pulses.          Radial pulses are 2+ on the right side and 2+ on the left side.     Heart sounds: Normal heart sounds, S1 normal and S2 normal. Heart sounds not distant. No murmur heard. Pulmonary:     Effort: Pulmonary effort is normal. No respiratory distress.     Breath sounds: Normal breath sounds. No wheezing.  Chest:     Chest wall: No tenderness.  Abdominal:     General: Bowel sounds are normal.     Palpations: Abdomen is soft.     Tenderness: There is abdominal tenderness. There is no guarding or rebound.     Comments: RUQ tenderness to palpation  Musculoskeletal:     Cervical back: Normal range of motion and neck supple. No muscular tenderness.     Right lower leg: No edema.     Left lower leg: No edema.  Skin:    General: Skin is warm and dry.     Coloration: Skin is not pale.  Neurological:     Mental Status: He is alert. Mental status is at baseline.  Psychiatric:        Mood and Affect: Mood normal.     ED Results / Procedures / Treatments   Labs (all labs ordered are listed, but only abnormal results are displayed) Labs Reviewed  CBC  COMPREHENSIVE METABOLIC PANEL  TROPONIN I (HIGH SENSITIVITY)    ED ECG REPORT   Date: 05/18/2022  Rate: 78   Rhythm: normal sinus rhythm  QRS Axis: normal  Intervals: normal  ST/T Wave abnormalities: normal  Conduction Disutrbances:none  Narrative Interpretation:   Old EKG Reviewed: unchanged from 12/29/2021  I have personally reviewed the EKG tracing and agree with the computerized printout as noted.   Radiology DG Chest 2 View  Result Date: 05/18/2022 CLINICAL DATA:  RIGHT chest and arm pain, palpitations and headache since this morning, history diabetes mellitus, hypertension, sleep apnea, prostate cancer EXAM: CHEST - 2 VIEW COMPARISON:  12/29/2021 FINDINGS: Upper normal heart size. Mediastinal contours and pulmonary vascularity normal. Lungs clear. No pulmonary infiltrate, pleural effusion,  or pneumothorax. Osseous structures unremarkable. IMPRESSION: No acute abnormalities. Electronically Signed   By: Lavonia Dana M.D.   On: 05/18/2022 14:37    Procedures Procedures    Medications Ordered in ED Medications - No data to display  ED Course/ Medical Decision Making/ A&P    Patient seen and examined. History obtained directly from patient.   Labs/EKG: Ordered CBC, CMP, troponin.  EKG.  Imaging: Ordered chest x-ray.  Medications/Fluids: None ordered  Most recent vital signs reviewed and are as follows: BP (!) 143/89 (BP Location: Right Arm)   Pulse 79   Temp 98 F (36.7 C) (Oral)   Resp 19   Ht '5\' 6"'$  (1.676 m)   Wt 127 kg   SpO2 95%   BMI 45.19 kg/m   Initial impression: Chest pain  3:00 PM Signout to oncoming EM resident Dr. Oneita Hurt at shift change.   Labs personally reviewed and interpreted including: EKG as above without ischemia, CBC unremarkable.   Imaging personally visualized and interpreted including: CXR, agree negative, similar to 12/2021.   Plan: F/u labs, reassess. Likely d/c if labs reassuring and symptoms improved.                            Medical Decision Making Amount and/or Complexity of Data Reviewed Labs: ordered. Radiology:  ordered.  Risk OTC drugs.   For this patient's complaint of chest pain, the following emergent conditions were considered on the differential diagnosis: acute coronary syndrome, pulmonary embolism, pneumothorax, myocarditis, pericardial tamponade, aortic dissection, thoracic aortic aneurysm complication, esophageal perforation.   Other causes were also considered including: gastroesophageal reflux disease, musculoskeletal pain including costochondritis, pneumonia/pleurisy, herpes zoster, pericarditis.         Final Clinical Impression(s) / ED Diagnoses Final diagnoses:  Precordial pain    Rx / DC Orders ED Discharge Orders     None         Carlisle Cater, PA-C 05/18/22 1505    Wyvonnia Dusky, MD 05/18/22 1538

## 2022-07-18 ENCOUNTER — Ambulatory Visit (INDEPENDENT_AMBULATORY_CARE_PROVIDER_SITE_OTHER): Payer: Medicare Other | Admitting: Podiatry

## 2022-07-18 ENCOUNTER — Encounter: Payer: Self-pay | Admitting: Podiatry

## 2022-07-18 DIAGNOSIS — R234 Changes in skin texture: Secondary | ICD-10-CM | POA: Diagnosis not present

## 2022-07-18 DIAGNOSIS — B351 Tinea unguium: Secondary | ICD-10-CM

## 2022-07-18 DIAGNOSIS — M79674 Pain in right toe(s): Secondary | ICD-10-CM | POA: Diagnosis not present

## 2022-07-18 DIAGNOSIS — E119 Type 2 diabetes mellitus without complications: Secondary | ICD-10-CM

## 2022-07-18 DIAGNOSIS — M79675 Pain in left toe(s): Secondary | ICD-10-CM

## 2022-07-19 ENCOUNTER — Ambulatory Visit (INDEPENDENT_AMBULATORY_CARE_PROVIDER_SITE_OTHER): Payer: Medicare Other

## 2022-07-19 DIAGNOSIS — E119 Type 2 diabetes mellitus without complications: Secondary | ICD-10-CM

## 2022-07-19 DIAGNOSIS — M2140 Flat foot [pes planus] (acquired), unspecified foot: Secondary | ICD-10-CM

## 2022-07-19 NOTE — Progress Notes (Signed)
Patient presents to the office today for diabetic shoe and insole measuring.  Patient was measured with brannock device to determine size and width for 1 pair of extra depth shoes and foam casted for 3 pair of insoles.   Documentation of medical necessity will be sent to patient's treating diabetic doctor to verify and sign.   Patient's diabetic provider: Cipriano Mile, NP ([facility])  Shoes and insoles will be ordered at that time and patient will be notified for an appointment for fitting when they arrive.   Shoe size (per patient): 10.5 medium men   Brannock measurement: 9.5 right foot and 10 left foot meduim   Patient shoe selection-   Shoe choice:   [id number] Apex  Shoe size ordered: 10.5 medium men's

## 2022-07-21 NOTE — Progress Notes (Signed)
  Subjective:  Patient ID: Thomas Bowman, male    DOB: 1951-04-14,  MRN: [id number]  Thomas Bowman presents to clinic today for:  Chief Complaint  Patient presents with   Nail Problem    Routine foot care Avel Peace PCP VST- Early 2023    New problem(s):   Patient states his right 5th toe is tender. Denies any redness, drainage or swelling. Denies any injury. Denies any fever, chills, night sweats, nausea or vomiting  PCP is Cipriano Mile, NP , and last visit was March, 2023.  Allergies  Allergen Reactions   Hydralazine Hcl     Pt experienced severe cramps and aches in hands and legs when dose increased from 25 TID to 50 TID. Resolved upon discontinuation of drug.   Sulfa Antibiotics Rash   Review of Systems: Negative except as noted in the HPI.  Objective:  Thomas Bowman is a pleasant 71 y.o. male WD, WN in NAD. AAO x 3. Vascular Examination: Vascular status intact b/l with palpable pedal pulses. Pedal hair present b/l. CFT immediate b/l. No pain with calf compression b/l. Skin temperature gradient WNL b/l. Trace edema noted BLE.  Neurological Examination: Sensation grossly intact b/l with 10 gram monofilament. Vibratory sensation intact b/l.   Dermatological Examination: Pedal skin with normal turgor, texture and tone b/l. Toenails 1-5 b/l thick, discolored, elongated with subungual debris and pain on dorsal palpation. No hyperkeratotic lesions noted b/l. No open wounds b/l LE.   Skin crack noted plantar aspect of right 5th digit with dried heme. No erythema, no edema, no drainage, no fluctuance.  Musculoskeletal Examination: Muscle strength 5/5 to b/l LE. No pain, crepitus or joint limitation noted with ROM bilateral LE. HAV with bunion deformity noted b/l LE. Assessment/Plan: 1. Pain due to onychomycosis of toenails of both feet   2. Cracked skin on feet   3. Type 2 diabetes mellitus without complication, without long-term current use of insulin (HCC)     No orders  of the defined types were placed in this encounter.   -Patient was evaluated and treated. All patient's and/or POA's questions/concerns answered on today's visit. -Examined patient. -Toenails 1-5 b/l were debrided in length and girth with sterile nail nippers and dremel without iatrogenic bleeding.  -Skin crack right 5th digit cleansed with alcohol. Triple antibiotic ointment and band-aid applied. Patient instructed to apply Neosporin to digit once daily until heale. -Patient/POA to call should there be question/concern in the interim.   Return in about 3 months (around 10/17/2022).  Marzetta Board, DPM

## 2022-09-03 ENCOUNTER — Emergency Department (HOSPITAL_COMMUNITY)
Admission: EM | Admit: 2022-09-03 | Discharge: 2022-09-03 | Disposition: A | Discharge status: Home / Self Care | Payer: Medicare Other | Attending: Emergency Medicine | Admitting: Emergency Medicine

## 2022-09-03 ENCOUNTER — Encounter (HOSPITAL_COMMUNITY): Payer: Self-pay

## 2022-09-03 ENCOUNTER — Other Ambulatory Visit: Payer: Self-pay

## 2022-09-03 ENCOUNTER — Emergency Department (HOSPITAL_COMMUNITY): Payer: Medicare Other

## 2022-09-03 DIAGNOSIS — E1165 Type 2 diabetes mellitus with hyperglycemia: Secondary | ICD-10-CM | POA: Insufficient documentation

## 2022-09-03 DIAGNOSIS — Z7984 Long term (current) use of oral hypoglycemic drugs: Secondary | ICD-10-CM | POA: Diagnosis not present

## 2022-09-03 DIAGNOSIS — I10 Essential (primary) hypertension: Secondary | ICD-10-CM | POA: Diagnosis not present

## 2022-09-03 DIAGNOSIS — Z8546 Personal history of malignant neoplasm of prostate: Secondary | ICD-10-CM | POA: Insufficient documentation

## 2022-09-03 DIAGNOSIS — R079 Chest pain, unspecified: Secondary | ICD-10-CM | POA: Insufficient documentation

## 2022-09-03 DIAGNOSIS — Z79899 Other long term (current) drug therapy: Secondary | ICD-10-CM | POA: Insufficient documentation

## 2022-09-03 DIAGNOSIS — Z7982 Long term (current) use of aspirin: Secondary | ICD-10-CM | POA: Diagnosis not present

## 2022-09-03 DIAGNOSIS — R739 Hyperglycemia, unspecified: Secondary | ICD-10-CM | POA: Diagnosis present

## 2022-09-03 DIAGNOSIS — R0789 Other chest pain: Secondary | ICD-10-CM

## 2022-09-03 DIAGNOSIS — R519 Headache, unspecified: Secondary | ICD-10-CM | POA: Insufficient documentation

## 2022-09-03 LAB — TROPONIN I (HIGH SENSITIVITY)
Troponin I (High Sensitivity): 12 ng/L (ref <18)
Troponin I (High Sensitivity): 12 ng/L (ref <18)

## 2022-09-03 LAB — HEPATIC FUNCTION PANEL
ALT: 14 U/L (ref 0–44)
AST: 17 U/L (ref 15–41)
Albumin: 3.6 g/dL (ref 3.5–5.0)
Alkaline Phosphatase: 72 U/L (ref 38–126)
Bilirubin, Direct: 0.1 mg/dL (ref 0.0–0.2)
Indirect Bilirubin: 0.8 mg/dL (ref 0.3–0.9)
Total Bilirubin: 0.9 mg/dL (ref 0.3–1.2)
Total Protein: 7.3 g/dL (ref 6.5–8.1)

## 2022-09-03 LAB — CBC
HCT: 43.7 % (ref 39.0–52.0)
Hemoglobin: 14.8 g/dL (ref 13.0–17.0)
MCH: 29.2 pg (ref 26.0–34.0)
MCHC: 33.9 g/dL (ref 30.0–36.0)
MCV: 86.2 fL (ref 80.0–100.0)
Platelets: 193 10*3/uL (ref 150–400)
RBC: 5.07 MIL/uL (ref 4.22–5.81)
RDW: 12.4 % (ref 11.5–15.5)
WBC: 4 10*3/uL (ref 4.0–10.5)
nRBC: 0 % (ref 0.0–0.2)

## 2022-09-03 LAB — BASIC METABOLIC PANEL
Anion gap: 11 (ref 5–15)
BUN: 19 mg/dL (ref 8–23)
CO2: 25 mmol/L (ref 22–32)
Calcium: 9.1 mg/dL (ref 8.9–10.3)
Chloride: 102 mmol/L (ref 98–111)
Creatinine, Ser: 1.09 mg/dL (ref 0.61–1.24)
GFR, Estimated: >60 mL/min (ref >60)
Glucose, Bld: 241 mg/dL — ABNORMAL HIGH (ref 70–99)
Potassium: 4.1 mmol/L (ref 3.5–5.1)
Sodium: 138 mmol/L (ref 135–145)

## 2022-09-03 LAB — LIPASE, BLOOD: Lipase: 31 U/L (ref 11–51)

## 2022-09-03 MED ORDER — METOPROLOL SUCCINATE ER 25 MG PO TB24
25.0000 mg | ORAL_TABLET | Freq: Once | ORAL | Status: AC
  Administered 2022-09-03: 25 mg via ORAL
  Filled 2022-09-03: qty 1

## 2022-09-03 MED ORDER — AMLODIPINE BESYLATE 5 MG PO TABS
10.0000 mg | ORAL_TABLET | Freq: Once | ORAL | Status: AC
  Administered 2022-09-03: 10 mg via ORAL
  Filled 2022-09-03: qty 2

## 2022-09-03 MED ORDER — DICLOFENAC SODIUM 1 % EX GEL: 2.0000 g | Freq: Four times a day (QID) | CUTANEOUS | 0 refills | Status: AC

## 2022-09-03 MED ORDER — IBUPROFEN 200 MG PO TABS
600.0000 mg | ORAL_TABLET | Freq: Once | ORAL | Status: AC
  Administered 2022-09-03: 600 mg via ORAL
  Filled 2022-09-03: qty 3

## 2022-09-03 MED ORDER — LISINOPRIL 10 MG PO TABS
40.0000 mg | ORAL_TABLET | Freq: Once | ORAL | Status: AC
  Administered 2022-09-03: 40 mg via ORAL
  Filled 2022-09-03: qty 4

## 2022-09-03 MED ORDER — ACETAMINOPHEN 500 MG PO TABS
1000.0000 mg | ORAL_TABLET | Freq: Once | ORAL | Status: AC
  Administered 2022-09-03: 1000 mg via ORAL
  Filled 2022-09-03: qty 2

## 2022-09-03 MED ORDER — LIDOCAINE 5 % EX PTCH
1.0000 | MEDICATED_PATCH | CUTANEOUS | Status: DC
  Administered 2022-09-03: 1 via TRANSDERMAL
  Filled 2022-09-03: qty 1

## 2022-09-03 MED ORDER — METHOCARBAMOL 500 MG PO TABS
500.0000 mg | ORAL_TABLET | Freq: Once | ORAL | Status: AC
  Administered 2022-09-03: 500 mg via ORAL
  Filled 2022-09-03: qty 1

## 2022-09-03 NOTE — ED Provider Notes (Signed)
[facility] DEPT Provider Note   CSN: [id number] Arrival date & time: 09/03/22  1052     History  Chief Complaint  Patient presents with   Hyperglycemia   Chest Pain   Headache    Thomas Bowman is a 71 y.o. male.  With PMH of HTN, HLD, DM 2, OSA, prostate cancer who presents with complaints of left sided chest pain.  Patient said he intermittently has chest pain here and there over the past couple weeks but got into a verbal altercation about a week ago with somebody [place] the court [place] which made him very upset and he was stressing about it today which made him feel like his heart was hurting more.  He never was physically punched or kicked in the chest.  He has had no fevers, no nausea, no vomiting, no cough or URI symptoms, no shortness of breath, no leg pain or swelling.  He should be on blood pressure medications but misses his medications often.  He has not taken anything for pain today.  Denies SI/HI/AVH. He was given aspirin and nitroglycerin without relief by EMS.  Of note, he had a cardiac catheterization left heart cath performed June 2022 which showed LVEF greater than 50% and microvascular dysfunction but no epicardial coronary disease.    Hyperglycemia Associated symptoms: chest pain   Chest Pain Associated symptoms: headache   Headache      Home Medications Prior to Admission medications   Medication Sig Start Date End Date Taking? Authorizing Provider  ALPRAZolam (XANAX) 0.25 MG tablet Take 0.25 mg by mouth 2 (two) times daily. 08/30/22  Yes [provider]  isosorbide mononitrate (IMDUR) 30 MG 24 hr tablet Take 30 mg by mouth every morning. 08/15/22  Yes [provider]  JANUVIA 100 MG tablet Take 100 mg by mouth every morning. 08/15/22  Yes [provider]  LIVALO 1 MG TABS Take 1 tablet by mouth at bedtime. 08/15/22  Yes [provider]  amLODipine (NORVASC) 10 MG tablet amlodipine 10 mg tablet   TAKE ONE TABLET BY MOUTH DAILY AT 9AM. 11/02/19   [provider]  amLODipine (NORVASC) 5 MG tablet  12/29/10   [provider]  aspirin EC 81 MG tablet aspirin 81 mg tablet,delayed release  TAKE 1 TABLET BY MOUTH EVERY DAY    [provider]  atorvastatin (LIPITOR) 20 MG tablet Take by mouth. 12/03/18   [provider]  ciclopirox (LOPROX) 0.77 % cream Apply to both feet and between toes bid x 6 weeks. 04/13/22   Marzetta Board, DPM  clotrimazole (LOTRIMIN) 1 % cream  12/13/20   [provider]  cyclobenzaprine (FLEXERIL) 10 MG tablet Take 10 mg by mouth 2 (two) times daily as needed. 12/20/21   [provider]  diclofenac sodium (VOLTAREN) 1 % GEL Apply 4 g topically 4 (four) times daily. 06/12/18   Marcial Pacas, MD  diclofenac Sodium (VOLTAREN) 1 % GEL diclofenac 1 % topical gel  APPLY TO THE AFFECTED AREA(S) TWO GRAMS TOPICALLY FOUR TIMES DAILY AS NEEDED (BULK). 04/04/21   [provider]  empagliflozin (JARDIANCE) 10 MG TABS tablet Take 10 mg by mouth daily.    [provider]  finasteride (PROSCAR) 5 MG tablet Take 5 mg by mouth at bedtime. 04/11/21   [provider]  glucose blood (TRUE METRIX PRO BLOOD GLUCOSE) test strip 1 each by Other route as needed for other. Use as instructed    [provider]  Lancets Thin MISC by Does not apply route. 23 gauge    [provider]  lisinopril (PRINIVIL,ZESTRIL) 40 MG tablet TAKE ONE TABLET BY MOUTH ONCE DAILY 08/22/16   Imogene Burn, PA-C  metFORMIN (GLUCOPHAGE) 500 MG tablet Take 500 mg by mouth at bedtime. 02/16/19   [provider]  metoprolol succinate (TOPROL XL) 25 MG 24 hr tablet Take 1 tablet (25 mg total) by mouth daily. 08/16/21   Sueanne Margarita, MD  metoprolol tartrate (LOPRESSOR) 100 MG tablet Take 100 mg by mouth daily. 12/20/21   [provider]  potassium chloride (KLOR-CON) 10 MEQ tablet take 1 tablet (10 meq) by oral route  once daily for 30 days    [provider]      Allergies    Hydralazine hcl and Sulfa antibiotics    Review of Systems   Review of Systems  Cardiovascular:  Positive for chest pain.  Neurological:  Positive for headaches.    Physical Exam Updated Vital Signs BP (!) 167/104   Pulse (!) 57   Temp 97.6 F (36.4 C)   Resp 18   Ht 5' 6.5" (1.689 m)   Wt 84.8 kg   SpO2 99%   BMI 29.73 kg/m  Physical Exam Constitutional: Alert and oriented.  Sitting in bed in no acute distress eyes: Conjunctivae are normal. ENT      Head: Normocephalic and atraumatic.      Nose: No congestion.      Mouth/Throat: Mucous membranes are moist.      Neck: No stridor. Cardiovascular: S1, S2, regular rate and rhythm, normal and symmetric distal pulses are present in all extremities.Warm and well perfused.  Reproducible chest pain on the left anterior chest wall with no external evidence of injury, no crepitus. Respiratory: Normal respiratory effort. Breath sounds are normal.  O2 sat 99-100 on RA Gastrointestinal: Soft and nontender. There is no CVA tenderness. Musculoskeletal: Normal range of motion in all extremities.      Right lower leg: No tenderness or edema.      Left lower leg: No tenderness or edema. Neurologic: Normal speech and language.  No facial droop.  Moving all extremities equally with equal strength of bilateral upper and lower extremities.  Sensation grossly intact.  No gross focal neurologic deficits are appreciated. Skin: Skin is warm, dry and intact. No rash noted. Psychiatric: Mood and affect are normal. Speech and behavior are normal.  ED Results / Procedures / Treatments   Labs (all labs ordered are listed, but only abnormal results are displayed) Labs Reviewed  BASIC METABOLIC PANEL - Abnormal; Notable for the following components:      Result Value   Glucose, Bld 241 (*)    All other components within normal limits  CBC  HEPATIC FUNCTION PANEL  LIPASE, BLOOD   TROPONIN I (HIGH SENSITIVITY)  TROPONIN I (HIGH SENSITIVITY)    EKG EKG Interpretation  Date/Time:  Monday September 03 2022 11:00:49 EST Ventricular Rate:  84 PR Interval:  152 QRS Duration: 91 QT Interval:  352 QTC Calculation: 416 R Axis:   -7 Text Interpretation: Sinus rhythm LAE, consider biatrial enlargement Probable left ventricular hypertrophy  T wave flattening aVL Confirmed by Georgina Snell [phone]) on 09/03/2022 1:26:44 PM  Radiology DG Chest 2 View  Result Date: 09/03/2022 CLINICAL DATA:  Chest pain EXAM: CHEST - 2 VIEW COMPARISON:  05/18/2022 FINDINGS: The heart size and mediastinal contours are within normal limits. Both lungs are clear. The visualized skeletal  structures are unremarkable. IMPRESSION: No active cardiopulmonary disease. Electronically Signed   By: Elmer Picker M.D.   On: 09/03/2022 11:56    Procedures Procedures    Medications Ordered in ED Medications  lidocaine (LIDODERM) 5 % 1 patch (1 patch Transdermal Patch Applied 09/03/22 1452)  amLODipine (NORVASC) tablet 10 mg (10 mg Oral Given 09/03/22 1452)  lisinopril (ZESTRIL) tablet 40 mg (40 mg Oral Given 09/03/22 1452)  metoprolol succinate (TOPROL-XL) 24 hr tablet 25 mg (25 mg Oral Given 09/03/22 1453)  ibuprofen (ADVIL) tablet 600 mg (600 mg Oral Given 09/03/22 1453)  acetaminophen (TYLENOL) tablet 1,000 mg (1,000 mg Oral Given 09/03/22 1453)  methocarbamol (ROBAXIN) tablet 500 mg (500 mg Oral Given 09/03/22 1454)    ED Course/ Medical Decision Making/ A&P                           Medical Decision Making  BODI PALMERI is a 71 y.o. male.  With PMH of HTN, HLD, DM 2, OSA, prostate cancer who presents with complaints of left sided chest pain.    Of note, he had a cardiac catheterization left heart cath performed June 2022 which showed LVEF greater than 50% and microvascular dysfunction but no epicardial coronary disease.  Regarding the patient's atypical reproducible chest pain,  their HEART score is 4 and overall have an EKG which is reassuring and unchanged from previous. Will further evaluate for ACS with high-sensitivity troponin at least 3 hours from the patient's start of pain with initial hs trop 12 reassuring..  Chest x-ray obtained which I personally reviewed which showed no evidence for pneumonia, pneumothorax, and pulmonary edema. I do not think aortic dissection as the patient is well-appearing, does not have ripping/tearing pain and equally has no pulse or neurologic deficits. They have had no recent instrumentation such as endoscopy, have no crepitus of the chest and are overall well appearing making Booerhave's unlikely.  Of note, patient was hypertensive but noncompliant with his medications.  He did have improvement once given pain medication and home antihypertensives.  Based on his work-up I have no concern for hypertensive crisis as he has no evidence of pulmonary edema, no AKI creatinine 1.09 within normal limits, no evidence of ischemic change on EKG and reassuring high sensitive troponins and no concern for aortic dissection as discussed.  Signed out to oncoming MD Dr Pearline Cables pending repeat troponin and likely discharge with follow-up with cardiology.   Amount and/or Complexity of Data Reviewed Labs: ordered.  Risk OTC drugs. Prescription drug management.    Final Clinical Impression(s) / ED Diagnoses Final diagnoses:  None    Rx / DC Orders ED Discharge Orders     None         Elgie Congo, MD 09/03/22 1542

## 2022-09-03 NOTE — ED Provider Notes (Signed)
Provider Note MRN:  [id number]  Arrival date & time: 09/03/22    ED Course and Medical Decision Making  Assumed care from Dr  Nechama Guard at shift change.  See note from prior team for complete details, in brief:  70 yo male  Hx HTN, HLD, DM, OSA Cc left sided cp Cp while having verbal altercation with individual this morning, attributes pain to increased stress Has since resolved BP and glucose were high this morning as well Pt does not typically take his home medications unless he is feeling unwell  Plan per prior physician follow up labs  5:43 PM Pt re-assessed after labs have resulted  Reports he is asymptomatic, says "ain't nothing wrong with my heart." He is HDS, breathing comfortably on ambient air He is reluctant to take home medications, I stressed importance of adherence to medication regimen. Reports he is worried about side effects but will consider taking them after he has done some research.  Advised pt to f/u with cardiology, reports he will see cardiology at [facility], does not want to see cardiology in [place] because they "make too many mistakes." ACS less likely given negative trop x2, EKG wnl, HDS; agree symptoms seem more atypical in nature. Ambu referral to cardiology placed by prior team  The patient improved significantly and was discharged in stable condition. Detailed discussions were had with the patient regarding current findings, and need for close f/u with PCP or on call doctor. The patient has been instructed to return immediately if the symptoms worsen in any way for re-evaluation. Patient verbalized understanding and is in agreement with current care plan. All questions answered prior to discharge.    Procedures  Final Clinical Impressions(s) / ED Diagnoses     ICD-10-CM   1. Atypical chest pain  R07.89 Ambulatory referral to Cardiology    2. Hypertension, unspecified type  I10       ED Discharge Orders          Ordered    Ambulatory referral to  Cardiology       Comments: If you have not heard from the Cardiology office within the next 72 hours please call [phone].   09/03/22 1542    diclofenac Sodium (VOLTAREN) 1 % GEL  4 times daily        09/03/22 1544              Discharge Instructions      You were seen in the emergency department today for elevated blood pressure and chest pain.  Your exam/workup today does not appear to show any abnormalities that would require admission. It is very important that you take all of your medications as prescribed, every day.  You can take Tylenol 1 g every 6-8 hours and ibuprofen 400 mg every 4-6 hours as needed for pain.  Please follow up with your doctor or cardiologist in the next 2-3 days to discuss your elevated blood pressures and chest pain and your current medication regimen.  Return to the emergency department if you develop any sudden/severe headache, confusion, slurred speech, worsening vision, weakness or numbness of any arm or leg, or for any chest pain, pressure, or trouble breathing.   If you have not heard from the Cardiology office within the next 72 hours please call [phone].   It was a pleasure caring for you today in the emergency department.  Please return to the emergency department for any worsening or worrisome symptoms.        Wynona Dove  A, DO 09/03/22 1747

## 2022-09-03 NOTE — ED Triage Notes (Addendum)
Per EMS- Patient co chest pain, headache, hyperglycemia. Patient reports that he has not been taking any of his meds except Xanax in 2 days.  Patient was given Aspirin 324 mg and NTG 0.4 mg x 1 prior to arrival tot the Ed with no relief.

## 2022-09-03 NOTE — ED Provider Triage Note (Signed)
Emergency Medicine Provider Triage Evaluation Note  Thomas Bowman , a 71 y.o. male  was evaluated in triage.  Pt complains of chest pain off and on for the past month after verbal altercation at work. The patient reports he is non compliant with his medications because he "doesn't like the side effects". Denies any SOB. Reports the pain as intermittent.  Review of Systems  Positive:  Negative:   Physical Exam  BP (!) 160/97 (BP Location: Left Arm)   Pulse 89   Temp 98.3 F (36.8 C) (Oral)   Resp (!) 24   Ht 5' 6.5" (1.689 m)   Wt 84.8 kg   SpO2 95%   BMI 29.73 kg/m  Gen:   Awake, no distress   Resp:  Normal effort  MSK:   Moves extremities without difficulty  Other:    Medical Decision Making  Medically screening exam initiated at 11:25 AM.  Appropriate orders placed.  [name] was informed that the remainder of the evaluation will be completed by another provider, this initial triage assessment does not replace that evaluation, and the importance of remaining in the ED until their evaluation is complete.  Chest pain work up.    Sherrell Puller, PA-C 09/03/22 1137

## 2022-09-03 NOTE — Discharge Instructions (Addendum)
You were seen in the emergency department today for elevated blood pressure and chest pain.  Your exam/workup today does not appear to show any abnormalities that would require admission. It is very important that you take all of your medications as prescribed, every day.  You can take Tylenol 1 g every 6-8 hours and ibuprofen 400 mg every 4-6 hours as needed for pain.  Please follow up with your doctor or cardiologist in the next 2-3 days to discuss your elevated blood pressures and chest pain and your current medication regimen.  Return to the emergency department if you develop any sudden/severe headache, confusion, slurred speech, worsening vision, weakness or numbness of any arm or leg, or for any chest pain, pressure, or trouble breathing.   If you have not heard from the Cardiology office within the next 72 hours please call [phone].   It was a pleasure caring for you today in the emergency department.  Please return to the emergency department for any worsening or worrisome symptoms.

## 2022-09-11 ENCOUNTER — Telehealth: Payer: Self-pay | Admitting: Podiatry

## 2022-09-11 NOTE — Telephone Encounter (Signed)
LMOM to call back to schedule appt to pick up diabectic shoes .   Auth expires 11/29/22

## 2022-09-19 NOTE — Telephone Encounter (Signed)
Left message for patient to call back to schedule appt to pick up diabetic shoes.

## 2022-09-21 ENCOUNTER — Ambulatory Visit: Payer: Self-pay | Admitting: Podiatry

## 2022-09-21 ENCOUNTER — Ambulatory Visit (INDEPENDENT_AMBULATORY_CARE_PROVIDER_SITE_OTHER): Payer: Medicare Other | Admitting: *Deleted

## 2022-09-21 DIAGNOSIS — M2141 Flat foot [pes planus] (acquired), right foot: Secondary | ICD-10-CM | POA: Diagnosis not present

## 2022-09-21 DIAGNOSIS — B353 Tinea pedis: Secondary | ICD-10-CM

## 2022-09-21 DIAGNOSIS — E119 Type 2 diabetes mellitus without complications: Secondary | ICD-10-CM

## 2022-09-21 DIAGNOSIS — M2140 Flat foot [pes planus] (acquired), unspecified foot: Secondary | ICD-10-CM

## 2022-09-21 DIAGNOSIS — M2142 Flat foot [pes planus] (acquired), left foot: Secondary | ICD-10-CM

## 2022-09-21 NOTE — Progress Notes (Signed)
Patient presents today to pick up diabetic shoe prescribed by Dr. Elisha Ponder.   Diabetic shoes and inserts were dispensed and fit was satisfactory. Reviewed instructions for break-in and wear. Written instructions given to patient.  Patient will follow up as needed.

## 2022-09-26 ENCOUNTER — Encounter: Payer: Self-pay | Admitting: Podiatry

## 2022-09-26 NOTE — Progress Notes (Signed)
Patient presented to clinic to pick up diabetic shoes.  1. Type 2 diabetes mellitus without complication, without long-term current use of insulin ([place])   2. Acquired flat foot, unspecified laterality     Dispensed one pair diabetic shoes and 3 pair total contact insoles. Shoes were appropriate fit with no heel slippage.   Reviewed warranty information and patient signed all paperwork stating patient received shoes, insert(s)/filler(s), break-in instructions and warranty information.   Patient instructed not to wear shoes outside unless completely satisfied. Patient related understanding.  Marzetta Board, DPM

## 2022-11-07 ENCOUNTER — Ambulatory Visit (INDEPENDENT_AMBULATORY_CARE_PROVIDER_SITE_OTHER): Payer: 59 | Admitting: Podiatry

## 2022-11-07 VITALS — BP 139/81

## 2022-11-07 DIAGNOSIS — M2011 Hallux valgus (acquired), right foot: Secondary | ICD-10-CM | POA: Diagnosis not present

## 2022-11-07 DIAGNOSIS — M79675 Pain in left toe(s): Secondary | ICD-10-CM

## 2022-11-07 DIAGNOSIS — M79674 Pain in right toe(s): Secondary | ICD-10-CM

## 2022-11-07 DIAGNOSIS — L853 Xerosis cutis: Secondary | ICD-10-CM | POA: Diagnosis not present

## 2022-11-07 DIAGNOSIS — E119 Type 2 diabetes mellitus without complications: Secondary | ICD-10-CM | POA: Diagnosis not present

## 2022-11-07 DIAGNOSIS — M2012 Hallux valgus (acquired), left foot: Secondary | ICD-10-CM

## 2022-11-07 DIAGNOSIS — M2042 Other hammer toe(s) (acquired), left foot: Secondary | ICD-10-CM

## 2022-11-07 DIAGNOSIS — B351 Tinea unguium: Secondary | ICD-10-CM

## 2022-11-07 DIAGNOSIS — M2041 Other hammer toe(s) (acquired), right foot: Secondary | ICD-10-CM

## 2022-11-07 MED ORDER — AMMONIUM LACTATE 12 % EX LOTN: 1.0000 | TOPICAL_LOTION | CUTANEOUS | 5 refills | Status: AC | PRN

## 2022-11-07 NOTE — Progress Notes (Signed)
ANNUAL DIABETIC FOOT EXAM  Subjective: Thomas Bowman presents today for annual diabetic foot examination.  Chief Complaint  Patient presents with   Nail Problem    [facility] BS-100 yesterday A1C-7.2 Thomas Bowman  PCP VST-08/2022   Patient confirms h/o diabetes.  Patient relates 5 year h/o diabetes.  Patient denies any h/o foot wounds.  Patient denies any numbness, tingling, burning, or pins/needle sensation in feet.  Past Medical History:  Diagnosis Date   Bone cancer ([place]) 90s   DM (diabetes mellitus) ([place])    History of bone cancer    HLD (hyperlipidemia)    Hypertension    Leg pain    Muscle strain of shoulder region    OSA (obstructive sleep apnea)    Overweight    Prostate cancer [facility])    PSA elevation    Shoulder pain    Patient Active Problem List   Diagnosis Date Noted   Benign prostatic hyperplasia 03/06/2022   Gastroesophageal reflux disease 03/06/2022   History of myocardial infarction 03/06/2022   Overweight 03/06/2022   CAD (coronary artery disease) 04/17/2021   Malignant neoplasm of prostate (Thomas Bowman) 01/10/2021   Bilateral impacted cerumen 04/28/2020   Hearing loss, sensorineural, asymmetrical 04/28/2020   Tinnitus of right ear 04/28/2020   DM2 (diabetes mellitus, type 2) ([place]) 02/16/2019   Pain in right hand 01/09/2019   Chronic right shoulder pain 03/26/2018   Hearing loss 02/26/2018   Obesity 02/26/2018   Shoulder pain, right 02/26/2018   Tremor 02/26/2018   Vitamin D deficiency 02/26/2018   Excessive daytime sleepiness 09/13/2014   OSA (obstructive sleep apnea) 07/27/2014   Impaired glucose tolerance 05/17/2014   HTN (hypertension) 05/16/2014   Chest pain 05/16/2014   Blepharoedema 06/17/2013   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   Cheek swelling 06/17/2013   Acquired flat foot 05/01/2011   Elevated fasting blood sugar 05/01/2011   Cancer of skeletal system ([place]) 05/01/2011   Combined fat and carbohydrate induced  hyperlipemia 05/01/2011   Essential (primary) hypertension 05/01/2011   Past Surgical History:  Procedure Laterality Date   APPENDECTOMY     FACIAL NERVE SURGERY Right    INTRAVASCULAR PRESSURE WIRE/FFR STUDY N/A 04/18/2021   Procedure: INTRAVASCULAR PRESSURE WIRE/FFR STUDY;  Surgeon: Belva Crome, MD;  Location: [place] [facility];  Service: Cardiovascular;  Laterality: N/A;   LEFT HEART CATH AND CORONARY ANGIOGRAPHY N/A 04/18/2021   Procedure: LEFT HEART CATH AND CORONARY ANGIOGRAPHY;  Surgeon: Belva Crome, MD;  Location: [place] [facility];  Service: Cardiovascular;  Laterality: N/A;   PROSTATE BIOPSY     Current Outpatient Medications on File Prior to Visit  Medication Sig Dispense Refill   ALPRAZolam (XANAX) 0.25 MG tablet Take 0.25 mg by mouth 2 (two) times daily as needed for anxiety.     amLODipine (NORVASC) 10 MG tablet Take 10 mg by mouth in the morning.     aspirin EC 81 MG tablet Take 81 mg by mouth 2 (two) times a week.     ciclopirox (LOPROX) 0.77 % cream Apply to both feet and between toes bid x 6 weeks. (Patient not taking: Reported on 09/03/2022) 60 g 1   cyclobenzaprine (FLEXERIL) 10 MG tablet Take 10 mg by mouth 2 (two) times daily as needed for muscle spasms.     diclofenac sodium (VOLTAREN) 1 % GEL Apply 4 g topically 4 (four) times daily. (Patient taking differently: Apply 2 g topically 4 (four) times daily as needed (to affected areas-  for pain).) 100 g 11   diclofenac Sodium (VOLTAREN) 1 % GEL Apply 2 g topically 4 (four) times daily. 50 g 0   empagliflozin (JARDIANCE) 10 MG TABS tablet Take 10 mg by mouth daily.     finasteride (PROSCAR) 5 MG tablet Take 5 mg by mouth at bedtime.     glucose blood (TRUE METRIX PRO BLOOD GLUCOSE) test strip 1 each by Other route as needed for other. Use as instructed     isosorbide mononitrate (IMDUR) 30 MG 24 hr tablet Take 30 mg by mouth every morning.     JANUVIA 100 MG tablet Take 100 mg by mouth every morning.     Lancets  Thin MISC by Does not apply route. 23 gauge     lisinopril (PRINIVIL,ZESTRIL) 40 MG tablet TAKE ONE TABLET BY MOUTH ONCE DAILY (Patient taking differently: Take 40 mg by mouth daily.) 90 tablet 3   metoprolol succinate (TOPROL XL) 25 MG 24 hr tablet Take 1 tablet (25 mg total) by mouth daily. (Patient not taking: Reported on 09/03/2022) 90 tablet 3   No current facility-administered medications on file prior to visit.    Allergies  Allergen Reactions   Hydralazine Hcl     Pt experienced severe cramps and aches in hands and legs when dose increased from 25 TID to 50 TID. Resolved upon discontinuation of drug.   Sulfa Antibiotics Rash   Social History   Occupational History   Not on file  Tobacco Use   Smoking status: Never   Smokeless tobacco: Never  Vaping Use   Vaping Use: Never used  Substance and Sexual Activity   Alcohol use: No   Drug use: No   Sexual activity: Yes   Family History  Problem Relation Age of Onset   Cancer Mother    Cancer Father    Hypertension Sister    Hypertension Brother    Heart attack Neg Hx    Breast cancer Neg Hx    Prostate cancer Neg Hx    Colon cancer Neg Hx    Pancreatic cancer Neg Hx    Immunization History  Administered Date(s) Administered   Tdap 03/19/2019     Review of Systems: Negative except as noted in the HPI.   Objective: Vitals:   11/07/22 0959  BP: 139/81   Thomas Bowman is a pleasant 72 y.o. male in NAD. AAO X 3.  Vascular Examination: CFT <4 seconds b/l LE. Palpable DP pulse(s) b/l LE. Faintly palpable PT pulse(s) b/l LE. Pedal hair absent. No pain with calf compression b/l. Lower extremity skin temperature gradient within normal limits. No edema noted b/l LE. No cyanosis or clubbing noted b/l LE.  Dermatological Examination: Pedal skin thin, shiny and atrophic b/l LE. No open wounds b/l LE. No interdigital macerations noted b/l LE. Toenails 1-5 bilaterally elongated, discolored, dystrophic, thickened, and crumbly  with subungual debris and tenderness to dorsal palpation.  Neurological Examination: Protective sensation intact 5/5 intact bilaterally with 10g monofilament b/l. Vibratory sensation intact b/l. Proprioception intact bilaterally.  Musculoskeletal Examination: Normal muscle strength 5/5 to all lower extremity muscle groups bilaterally. HAV with bunion bilaterally and hammertoes 2-5 b/l.Marland Kitchen No pain, crepitus or joint limitation noted with ROM b/l LE.  Patient ambulates independently without assistive aids.  Footwear Assessment: Does the patient wear appropriate shoes? Yes. Does the patient need inserts/orthotics? Yes.  ADA Risk Categorization: Low Risk :  Patient has all of the following: Intact protective sensation No prior foot ulcer  No  severe deformity Pedal pulses present  Assessment: 1. Pain due to onychomycosis of toenails of both feet   2. Hammer toes of both feet   3. Hallux valgus, acquired, bilateral   4. Xerosis cutis   5. Type 2 diabetes mellitus without complication, without long-term current use of insulin ([place])   6. Encounter for diabetic foot exam ([place])     Plan: Meds ordered this encounter  Medications   ammonium lactate (LAC-HYDRIN) 12 % lotion    Sig: Apply 1 Application topically as needed for dry skin.    Dispense:  400 g    Refill:  5     -Patient was evaluated and treated. All patient's and/or POA's questions/concerns answered on today's visit. -Diabetic foot examination performed today. -Continue foot and shoe inspections daily. Monitor blood glucose per PCP/Endocrinologist's recommendations. -Patient to continue soft, supportive shoe gear daily. -Mycotic toenails 1-5 bilaterally were debrided in length and girth with sterile nail nippers and dremel without incident. -For xerosis, Rx sent for Ammonium Lactate Lotion 12%. Apply to feet twice daily avoiding application between toes. -Patient/POA to call should there be question/concern in the  interim. Return in about 3 months (around 02/06/2023).  Marzetta Board, DPM

## 2022-11-11 ENCOUNTER — Encounter: Payer: Self-pay | Admitting: Podiatry

## 2023-02-13 ENCOUNTER — Ambulatory Visit (INDEPENDENT_AMBULATORY_CARE_PROVIDER_SITE_OTHER): Payer: 59 | Admitting: Podiatry

## 2023-02-13 DIAGNOSIS — B351 Tinea unguium: Secondary | ICD-10-CM

## 2023-02-13 DIAGNOSIS — M79674 Pain in right toe(s): Secondary | ICD-10-CM | POA: Diagnosis not present

## 2023-02-13 DIAGNOSIS — M79675 Pain in left toe(s): Secondary | ICD-10-CM

## 2023-02-13 DIAGNOSIS — E119 Type 2 diabetes mellitus without complications: Secondary | ICD-10-CM | POA: Diagnosis not present

## 2023-02-13 DIAGNOSIS — L853 Xerosis cutis: Secondary | ICD-10-CM | POA: Diagnosis not present

## 2023-02-13 MED ORDER — AMMONIUM LACTATE 12 % EX LOTN: 1.0000 | TOPICAL_LOTION | CUTANEOUS | 0 refills | Status: AC | PRN

## 2023-02-13 NOTE — Progress Notes (Signed)
ANNUAL DIABETIC FOOT EXAM  Subjective: Thomas Bowman presents today for annual diabetic foot examination.  Chief Complaint  Patient presents with   Nail Problem    [facility]   Patient confirms h/o diabetes.  Patient relates 5 year h/o diabetes.  Patient denies any h/o foot wounds.  Patient denies any numbness, tingling, burning, or pins/needle sensation in feet.  Past Medical History:  Diagnosis Date   Bone cancer ([facility]) 90s   DM (diabetes mellitus) ([facility])    History of bone cancer    HLD (hyperlipidemia)    Hypertension    Leg pain    Muscle strain of shoulder region    OSA (obstructive sleep apnea)    Overweight    Prostate cancer ([facility])    PSA elevation    Shoulder pain    Patient Active Problem List   Diagnosis Date Noted   Benign prostatic hyperplasia 03/06/2022   Gastroesophageal reflux disease 03/06/2022   History of myocardial infarction 03/06/2022   Overweight 03/06/2022   CAD (coronary artery disease) 04/17/2021   Malignant neoplasm of prostate 01/10/2021   Bilateral impacted cerumen 04/28/2020   Hearing loss, sensorineural, asymmetrical 04/28/2020   Tinnitus of right ear 04/28/2020   DM2 (diabetes mellitus, type 2) 02/16/2019   Pain in right hand 01/09/2019   Chronic right shoulder pain 03/26/2018   Hearing loss 02/26/2018   Obesity 02/26/2018   Shoulder pain, right 02/26/2018   Tremor 02/26/2018   Vitamin D deficiency 02/26/2018   Excessive daytime sleepiness 09/13/2014   OSA (obstructive sleep apnea) 07/27/2014   Impaired glucose tolerance 05/17/2014   HTN (hypertension) 05/16/2014   Chest pain 05/16/2014   Blepharoedema 06/17/2013   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   Cheek swelling 06/17/2013   Acquired flat foot 05/01/2011   Elevated fasting blood sugar 05/01/2011   Cancer of skeletal system 05/01/2011   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   Essential (primary) hypertension 05/01/2011   Past  Surgical History:  Procedure Laterality Date   APPENDECTOMY     CORONARY PRESSURE/FFR STUDY N/A 04/18/2021   Procedure: INTRAVASCULAR PRESSURE WIRE/FFR STUDY;  Surgeon: Lyn Records, MD;  [facility];  Service: Cardiovascular;  Laterality: N/A;   FACIAL NERVE SURGERY Right    LEFT HEART CATH AND CORONARY ANGIOGRAPHY N/A 04/18/2021   Procedure: LEFT HEART CATH AND CORONARY ANGIOGRAPHY;  Surgeon: Lyn Records, MD;  [facility]: [facility];  Service: Cardiovascular;  Laterality: N/A;   PROSTATE BIOPSY     Current Outpatient Medications on File Prior to Visit  Medication Sig Dispense Refill   ALPRAZolam (XANAX) 0.25 MG tablet Take 0.25 mg by mouth 2 (two) times daily as needed for anxiety.     amLODipine (NORVASC) 10 MG tablet Take 10 mg by mouth in the morning.     ammonium lactate (LAC-HYDRIN) 12 % lotion Apply 1 Application topically as needed for dry skin. 400 g 5   aspirin EC 81 MG tablet Take 81 mg by mouth 2 (two) times a week.     ciclopirox (LOPROX) 0.77 % cream Apply to both feet and between toes bid x 6 weeks. (Patient not taking: Reported on 09/03/2022) 60 g 1   cyclobenzaprine (FLEXERIL) 10 MG tablet Take 10 mg by mouth 2 (two) times daily as needed for muscle spasms.     diclofenac sodium (VOLTAREN) 1 % GEL Apply 4 g topically 4 (four) times daily. (Patient taking differently: Apply 2 g topically 4 (  four) times daily as needed (to affected areas- for pain).) 100 g 11   diclofenac Sodium (VOLTAREN) 1 % GEL Apply 2 g topically 4 (four) times daily. 50 g 0   empagliflozin (JARDIANCE) 10 MG TABS tablet Take 10 mg by mouth daily.     finasteride (PROSCAR) 5 MG tablet Take 5 mg by mouth at bedtime.     glucose blood (TRUE METRIX PRO BLOOD GLUCOSE) test strip 1 each by Other route as needed for other. Use as instructed     isosorbide mononitrate (IMDUR) 30 MG 24 hr tablet Take 30 mg by mouth every morning.     JANUVIA 100 MG tablet Take 100 mg by mouth every  morning.     Lancets Thin MISC by Does not apply route. 23 gauge     lisinopril (PRINIVIL,ZESTRIL) 40 MG tablet TAKE ONE TABLET BY MOUTH ONCE DAILY (Patient taking differently: Take 40 mg by mouth daily.) 90 tablet 3   metoprolol succinate (TOPROL XL) 25 MG 24 hr tablet Take 1 tablet (25 mg total) by mouth daily. (Patient not taking: Reported on 09/03/2022) 90 tablet 3   No current facility-administered medications on file prior to visit.    Allergies  Allergen Reactions   Hydralazine Hcl     Pt experienced severe cramps and aches in hands and legs when dose increased from 25 TID to 50 TID. Resolved upon discontinuation of drug.   Sulfa Antibiotics Rash   Social History   Occupational History   Not on file  Tobacco Use   Smoking status: Never   Smokeless tobacco: Never  Vaping Use   Vaping Use: Never used  Substance and Sexual Activity   Alcohol use: No   Drug use: No   Sexual activity: Yes   Family History  Problem Relation Age of Onset   Cancer Mother    Cancer Father    Hypertension Sister    Hypertension Brother    Heart attack Neg Hx    Breast cancer Neg Hx    Prostate cancer Neg Hx    Colon cancer Neg Hx    Pancreatic cancer Neg Hx    Immunization History  Administered Date(s) Administered   Tdap 03/19/2019     Review of Systems: Negative except as noted in the HPI.   Objective: There were no vitals filed for this visit.  Thomas Bowman is a pleasant 72 y.o. male in NAD. AAO X 3.  Vascular Examination: CFT <4 seconds b/l LE. Palpable DP pulse(s) b/l LE. Faintly palpable PT pulse(s) b/l LE. Pedal hair absent. No pain with calf compression b/l. Lower extremity skin temperature gradient within normal limits. No edema noted b/l LE. No cyanosis or clubbing noted b/l LE.  Dermatological Examination: Pedal skin thin, shiny and atrophic b/l LE. No open wounds b/l LE. No interdigital macerations noted b/l LE. Toenails 1-5 bilaterally elongated, discolored,  dystrophic, thickened, and crumbly with subungual debris and tenderness to dorsal palpation.  Neurological Examination: Protective sensation intact 5/5 intact bilaterally with 10g monofilament b/l. Vibratory sensation intact b/l. Proprioception intact bilaterally.  Musculoskeletal Examination: Normal muscle strength 5/5 to all lower extremity muscle groups bilaterally. HAV with bunion bilaterally and hammertoes 2-5 b/l.Marland Kitchen No pain, crepitus or joint limitation noted with ROM b/l LE.  Patient ambulates independently without assistive aids.  Footwear Assessment: Does the patient wear appropriate shoes? Yes. Does the patient need inserts/orthotics? Yes.  ADA Risk Categorization: Low Risk :  Patient has all of the following: Intact protective  sensation No prior foot ulcer  No severe deformity Pedal pulses present  Assessment: 1. Pain due to onychomycosis of toenails of both feet   2. Xerosis cutis      Plan: Meds ordered this encounter  Medications   ammonium lactate (AMLACTIN DAILY) 12 % lotion    Sig: Apply 1 Application topically as needed for dry skin.    Dispense:  400 g    Refill:  0     -Patient was evaluated and treated. All patient's and/or POA's questions/concerns answered on today's visit. -Diabetic foot examination performed today. -Continue foot and shoe inspections daily. Monitor blood glucose per PCP/Endocrinologist's recommendations. -Patient to continue soft, supportive shoe gear daily. -Mycotic toenails 1-5 bilaterally were debrided in length and girth with sterile nail nippers and dremel without incident. -For xerosis, Rx sent for Ammonium Lactate Lotion 12%. Apply to feet twice daily avoiding application between toes. -Patient/POA to call should there be question/concern in the interim. No follow-ups on file.  Candelaria Stagers, DPM

## 2023-05-27 ENCOUNTER — Encounter: Payer: 59 | Admitting: Podiatry

## 2023-05-28 NOTE — Progress Notes (Signed)
Patient was a no-show for today's scheduled appointment.

## 2023-11-05 ENCOUNTER — Ambulatory Visit
Admission: RE | Admit: 2023-11-05 | Discharge: 2023-11-05 | Disposition: A | Discharge status: Home / Self Care | Payer: 59 | Source: Ambulatory Visit | Attending: Student | Admitting: Student

## 2023-11-05 ENCOUNTER — Other Ambulatory Visit: Payer: Self-pay | Admitting: Student

## 2023-11-05 DIAGNOSIS — M25561 Pain in right knee: Secondary | ICD-10-CM

## 2023-12-02 ENCOUNTER — Ambulatory Visit (INDEPENDENT_AMBULATORY_CARE_PROVIDER_SITE_OTHER): Payer: 59 | Admitting: Physician Assistant

## 2023-12-02 ENCOUNTER — Encounter: Payer: Self-pay | Admitting: Physician Assistant

## 2023-12-02 DIAGNOSIS — M25561 Pain in right knee: Secondary | ICD-10-CM | POA: Diagnosis not present

## 2023-12-02 MED ORDER — DICLOFENAC SODIUM 1 % EX GEL: 2.0000 g | Freq: Four times a day (QID) | CUTANEOUS | 0 refills | Status: AC | PRN

## 2023-12-02 NOTE — Progress Notes (Signed)
 Office Visit Note   Patient: Thomas Bowman           Date of Birth: [birth year]           MRN: [id number] Visit Date: 12/02/2023              Requested by: Maryellen Snare, NP [address] [place],  [place] [address] PCP: Maryellen Snare, NP   Assessment & Plan: Visit Diagnoses:  1. Right knee pain, unspecified chronicity     Plan patient is a 73 year old gentleman who presents today with a chief complaint of right medial knee pain.  Denies any falls he did have x-rays that showed some early arthritis and a small traction injury to the superior pole of the patella.  This has been going on for about a month.  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  Cannot rule out a meniscus tear but he has no mechanical symptoms.  We talked about this.  Certainly an option would be to try a steroid injection today.  He declines this.  I see he has used Voltaren  gel in the past that he can combine this with some Tylenol  arthritis.  Could reconsider a injection in the future  Follow-Up Instructions: No follow-ups on file.   Orders:  No orders of the defined types were placed in this encounter.  Meds ordered this encounter  Medications   diclofenac  Sodium (VOLTAREN ) 1 % GEL    Sig: Apply 2 g topically 4 (four) times daily as needed.    Dispense:  150 g    Refill:  0      Procedures: No procedures performed   Clinical Data: No additional findings.   Subjective: Chief Complaint  Patient presents with   Right Knee - Pain    HPI patient is a pleasant 73 year old gentleman 1 month history of right knee pain denies any injuries denies any change in activity did get an x-ray done which demonstrated some early arthritis and a small avulsion area at the superior pole the patella states his pain is medially no pain over the quad tendon  Review of Systems  All other systems reviewed and are negative.    Objective: Vital Signs: There were  no vitals taken for this visit.  Physical Exam Constitutional:      Appearance: Normal appearance.  Pulmonary:     Effort: Pulmonary effort is normal.  Skin:    General: Skin is warm and dry.  Neurological:     General: No focal deficit present.     Mental Status: He is alert and oriented to person, place, and time.  Psychiatric:        Mood and Affect: Mood normal.        Behavior: Behavior normal.     Ortho Exam Examination of his knees he has no erythema no effusion compartments are soft and compressible he is neurovascularly intact he does have tenderness over the medial joint line he has no tenderness over the quad or the patella tendon and has good extension and flexion strength. Specialty Comments:  No specialty comments available.  Imaging: No results found.   PMFS History: Patient Active Problem List   Diagnosis Date Noted   Pain in right knee 12/02/2023   Benign prostatic hyperplasia 03/06/2022   Gastroesophageal reflux disease 03/06/2022   History of myocardial infarction 03/06/2022   Overweight 03/06/2022   CAD (coronary artery disease) 04/17/2021   Malignant neoplasm of  prostate ([facility]) 01/10/2021   Bilateral impacted cerumen 04/28/2020   Hearing loss, sensorineural, asymmetrical 04/28/2020   Tinnitus of right ear 04/28/2020   DM2 (diabetes mellitus, type 2) ([facility]) 02/16/2019   Pain in right hand 01/09/2019   Chronic right shoulder pain 03/26/2018   Hearing loss 02/26/2018   Obesity 02/26/2018   Shoulder pain, right 02/26/2018   Tremor 02/26/2018   Vitamin D deficiency 02/26/2018   Excessive daytime sleepiness 09/13/2014   OSA (obstructive sleep apnea) 07/27/2014   Impaired glucose tolerance 05/17/2014   HTN (hypertension) 05/16/2014   Chest pain 05/16/2014   Blepharoedema 06/17/2013   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   Cheek swelling 06/17/2013   Acquired flat foot 05/01/2011   Elevated fasting blood sugar 05/01/2011   Cancer  of skeletal system ([facility]) 05/01/2011   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   Essential (primary) hypertension 05/01/2011   Past Medical History:  Diagnosis Date   Bone cancer ([facility]) 90s   DM (diabetes mellitus) ([facility])    History of bone cancer    HLD (hyperlipidemia)    Hypertension    Leg pain    Muscle strain of shoulder region    OSA (obstructive sleep apnea)    Overweight    Prostate cancer ([facility])    PSA elevation    Shoulder pain     Family History  Problem Relation Age of Onset   Cancer Mother    Cancer Father    Hypertension Sister    Hypertension Brother    Heart attack Neg Hx    Breast cancer Neg Hx    Prostate cancer Neg Hx    Colon cancer Neg Hx    Pancreatic cancer Neg Hx     Past Surgical History:  Procedure Laterality Date   APPENDECTOMY     CORONARY PRESSURE/FFR STUDY N/A 04/18/2021   Procedure: INTRAVASCULAR PRESSURE WIRE/FFR STUDY;  Surgeon: Arty Binning, MD;  [facility];  Service: Cardiovascular;  Laterality: N/A;   FACIAL NERVE SURGERY Right    LEFT HEART CATH AND CORONARY ANGIOGRAPHY N/A 04/18/2021   Procedure: LEFT HEART CATH AND CORONARY ANGIOGRAPHY;  Surgeon: Arty Binning, MD;  [facility];  Service: Cardiovascular;  Laterality: N/A;   PROSTATE BIOPSY     Social History   Occupational History   Not on file  Tobacco Use   Smoking status: Never   Smokeless tobacco: Never  Vaping Use   Vaping status: Never Used  Substance and Sexual Activity   Alcohol use: No   Drug use: No   Sexual activity: Yes

## 2024-03-27 ENCOUNTER — Ambulatory Visit (INDEPENDENT_AMBULATORY_CARE_PROVIDER_SITE_OTHER): Admitting: Podiatry

## 2024-03-27 DIAGNOSIS — M2042 Other hammer toe(s) (acquired), left foot: Secondary | ICD-10-CM

## 2024-03-27 DIAGNOSIS — B351 Tinea unguium: Secondary | ICD-10-CM | POA: Diagnosis not present

## 2024-03-27 DIAGNOSIS — M2041 Other hammer toe(s) (acquired), right foot: Secondary | ICD-10-CM

## 2024-03-27 DIAGNOSIS — M79674 Pain in right toe(s): Secondary | ICD-10-CM

## 2024-03-27 DIAGNOSIS — E119 Type 2 diabetes mellitus without complications: Secondary | ICD-10-CM | POA: Diagnosis not present

## 2024-03-27 DIAGNOSIS — M79675 Pain in left toe(s): Secondary | ICD-10-CM | POA: Diagnosis not present

## 2024-03-27 NOTE — Progress Notes (Signed)
 ANNUAL DIABETIC FOOT EXAM  Subjective: Thomas Bowman presents today for annual diabetic foot examination.  Chief Complaint  Patient presents with   Diabetes   Patient confirms h/o diabetes.  Patient relates 5 year h/o diabetes.  Patient denies any h/o foot wounds.  Patient denies any numbness, tingling, burning, or pins/needle sensation in feet.  Past Medical History:  Diagnosis Date   Bone cancer (HCC) 90s   DM (diabetes mellitus) (HCC)    History of bone cancer    HLD (hyperlipidemia)    Hypertension    Leg pain    Muscle strain of shoulder region    OSA (obstructive sleep apnea)    Overweight    Prostate cancer (HCC)    PSA elevation    Shoulder pain    Patient Active Problem List   Diagnosis Date Noted   Pain in right knee 12/02/2023   Benign prostatic hyperplasia 03/06/2022   Gastroesophageal reflux disease 03/06/2022   History of myocardial infarction 03/06/2022   Overweight 03/06/2022   CAD (coronary artery disease) 04/17/2021   Malignant neoplasm of prostate (HCC) 01/10/2021   Bilateral impacted cerumen 04/28/2020   Hearing loss, sensorineural, asymmetrical 04/28/2020   Tinnitus of right ear 04/28/2020   DM2 (diabetes mellitus, type 2) (HCC) 02/16/2019   Pain in right hand 01/09/2019   Chronic right shoulder pain 03/26/2018   Hearing loss 02/26/2018   Obesity 02/26/2018   Shoulder pain, right 02/26/2018   Tremor 02/26/2018   Vitamin D deficiency 02/26/2018   Excessive daytime sleepiness 09/13/2014   OSA (obstructive sleep apnea) 07/27/2014   Impaired glucose tolerance 05/17/2014   HTN (hypertension) 05/16/2014   Chest pain 05/16/2014   Blepharoedema 06/17/2013   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   Cheek swelling 06/17/2013   Acquired flat foot 05/01/2011   Elevated fasting blood sugar 05/01/2011   Cancer of skeletal system (HCC) 05/01/2011   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   Essential (primary)  hypertension 05/01/2011   Past Surgical History:  Procedure Laterality Date   APPENDECTOMY     CORONARY PRESSURE/FFR STUDY N/A 04/18/2021   Procedure: INTRAVASCULAR PRESSURE WIRE/FFR STUDY;  Surgeon: Arty Binning, MD;  Location: [facility];  Service: Cardiovascular;  Laterality: N/A;   FACIAL NERVE SURGERY Right    LEFT HEART CATH AND CORONARY ANGIOGRAPHY N/A 04/18/2021   Procedure: LEFT HEART CATH AND CORONARY ANGIOGRAPHY;  Surgeon: Arty Binning, MD;  Location: [facility];  Service: Cardiovascular;  Laterality: N/A;   PROSTATE BIOPSY     Current Outpatient Medications on File Prior to Visit  Medication Sig Dispense Refill   ALPRAZolam (XANAX) 0.25 MG tablet Take 0.25 mg by mouth 2 (two) times daily as needed for anxiety.     amLODipine  (NORVASC ) 10 MG tablet Take 10 mg by mouth in the morning.     ammonium lactate  (AMLACTIN DAILY) 12 % lotion Apply 1 Application topically as needed for dry skin. 400 g 0   ammonium lactate  (LAC-HYDRIN ) 12 % lotion Apply 1 Application topically as needed for dry skin. 400 g 5   aspirin  EC 81 MG tablet Take 81 mg by mouth 2 (two) times a week.     ciclopirox  (LOPROX ) 0.77 % cream Apply to both feet and between toes bid x 6 weeks. (Patient not taking: Reported on 09/03/2022) 60 g 1   cyclobenzaprine (FLEXERIL) 10 MG tablet Take 10 mg by mouth 2 (two) times daily as needed for muscle spasms.  diclofenac  sodium (VOLTAREN ) 1 % GEL Apply 4 g topically 4 (four) times daily. (Patient taking differently: Apply 2 g topically 4 (four) times daily as needed (to affected areas- for pain).) 100 g 11   diclofenac  Sodium (VOLTAREN ) 1 % GEL Apply 2 g topically 4 (four) times daily. 50 g 0   empagliflozin (JARDIANCE) 10 MG TABS tablet Take 10 mg by mouth daily.     finasteride (PROSCAR) 5 MG tablet Take 5 mg by mouth at bedtime.     glucose blood (TRUE METRIX PRO BLOOD GLUCOSE) test strip 1 each by Other route as needed for other. Use as instructed      isosorbide mononitrate (IMDUR) 30 MG 24 hr tablet Take 30 mg by mouth every morning.     JANUVIA 100 MG tablet Take 100 mg by mouth every morning.     Lancets Thin MISC by Does not apply route. 23 gauge     lisinopril  (PRINIVIL ,ZESTRIL ) 40 MG tablet TAKE ONE TABLET BY MOUTH ONCE DAILY (Patient taking differently: Take 40 mg by mouth daily.) 90 tablet 3   metoprolol  succinate (TOPROL  XL) 25 MG 24 hr tablet Take 1 tablet (25 mg total) by mouth daily. (Patient not taking: Reported on 09/03/2022) 90 tablet 3   No current facility-administered medications on file prior to visit.    Allergies  Allergen Reactions   Hydralazine  Hcl     Pt experienced severe cramps and aches in hands and legs when dose increased from 25 TID to 50 TID. Resolved upon discontinuation of drug.   Sulfa Antibiotics Rash   Social History   Occupational History   Not on file  Tobacco Use   Smoking status: Never   Smokeless tobacco: Never  Vaping Use   Vaping status: Never Used  Substance and Sexual Activity   Alcohol use: No   Drug use: No   Sexual activity: Yes   Family History  Problem Relation Age of Onset   Cancer Mother    Cancer Father    Hypertension Sister    Hypertension Brother    Heart attack Neg Hx    Breast cancer Neg Hx    Prostate cancer Neg Hx    Colon cancer Neg Hx    Pancreatic cancer Neg Hx    Immunization History  Administered Date(s) Administered   Tdap 03/19/2019     Review of Systems: Negative except as noted in the HPI.   Objective: There were no vitals filed for this visit.  Thomas Bowman is a pleasant 73 y.o. male in NAD. AAO X 3.  Vascular Examination: CFT <4 seconds b/l LE. Palpable DP pulse(s) b/l LE. Faintly palpable PT pulse(s) b/l LE. Pedal hair absent. No pain with calf compression b/l. Lower extremity skin temperature gradient within normal limits. No edema noted b/l LE. No cyanosis or clubbing noted b/l LE.  Dermatological Examination: Pedal skin thin, shiny  and atrophic b/l LE. No open wounds b/l LE. No interdigital macerations noted b/l LE. Toenails 1-5 bilaterally elongated, discolored, dystrophic, thickened, and crumbly with subungual debris and tenderness to dorsal palpation.  Neurological Examination: Protective sensation intact 5/5 intact bilaterally with 10g monofilament b/l. Vibratory sensation intact b/l. Proprioception intact bilaterally.  Musculoskeletal Examination: Normal muscle strength 5/5 to all lower extremity muscle groups bilaterally. HAV with bunion bilaterally and hammertoes 2-5 b/l.Aaron Aas No pain, crepitus or joint limitation noted with ROM b/l LE.  Patient ambulates independently without assistive aids.  Footwear Assessment: Does the patient wear appropriate shoes? Yes.  Does the patient need inserts/orthotics? Yes.  ADA Risk Categorization: Low Risk :  Patient has all of the following: Intact protective sensation No prior foot ulcer  No severe deformity Pedal pulses present  Assessment: No diagnosis found.    Plan: No orders of the defined types were placed in this encounter.    -Patient was evaluated and treated. All patient's and/or POA's questions/concerns answered on today's visit. -Diabetic foot examination performed today. -Continue foot and shoe inspections daily. Monitor blood glucose per PCP/Endocrinologist's recommendations. -Patient to continue soft, supportive shoe gear daily. -Mycotic toenails 1-5 bilaterally were debrided in length and girth with sterile nail nippers and dremel without incident. -For xerosis, Rx sent for Ammonium Lactate  Lotion 12%. Apply to feet twice daily avoiding application between toes. -Patient/POA to call should there be question/concern in the interim. - Given that patient has hammertoe deformity in setting of diabetes patient will benefit from diabetic shoes.  Patient was given prescription for [facility] for diabetic shoes  No follow-ups on file.  Sakai Heinle P Donney Caraveo, DPM

## 2024-06-10 ENCOUNTER — Emergency Department (HOSPITAL_BASED_OUTPATIENT_CLINIC_OR_DEPARTMENT_OTHER)
Admission: EM | Admit: 2024-06-10 | Discharge: 2024-06-10 | Disposition: A | Discharge status: Home / Self Care | Attending: Emergency Medicine | Admitting: Emergency Medicine

## 2024-06-10 ENCOUNTER — Emergency Department (HOSPITAL_BASED_OUTPATIENT_CLINIC_OR_DEPARTMENT_OTHER): Admitting: Radiology

## 2024-06-10 ENCOUNTER — Other Ambulatory Visit: Payer: Self-pay

## 2024-06-10 DIAGNOSIS — Z8546 Personal history of malignant neoplasm of prostate: Secondary | ICD-10-CM | POA: Diagnosis not present

## 2024-06-10 DIAGNOSIS — S6991XA Unspecified injury of right wrist, hand and finger(s), initial encounter: Secondary | ICD-10-CM | POA: Diagnosis not present

## 2024-06-10 DIAGNOSIS — Z23 Encounter for immunization: Secondary | ICD-10-CM | POA: Diagnosis not present

## 2024-06-10 DIAGNOSIS — Z85830 Personal history of malignant neoplasm of bone: Secondary | ICD-10-CM | POA: Diagnosis not present

## 2024-06-10 DIAGNOSIS — Z7982 Long term (current) use of aspirin: Secondary | ICD-10-CM | POA: Diagnosis not present

## 2024-06-10 DIAGNOSIS — W230XXA Caught, crushed, jammed, or pinched between moving objects, initial encounter: Secondary | ICD-10-CM | POA: Insufficient documentation

## 2024-06-10 MED ORDER — TETANUS-DIPHTH-ACELL PERTUSSIS 5-2.5-18.5 LF-MCG/0.5 IM SUSY
0.5000 mL | PREFILLED_SYRINGE | Freq: Once | INTRAMUSCULAR | Status: AC
  Administered 2024-06-10: 0.5 mL via INTRAMUSCULAR
  Filled 2024-06-10: qty 0.5

## 2024-06-10 NOTE — ED Triage Notes (Signed)
 Small lac to R thumb from spring hitting hand. Moderate swelling to area.   Unknown last tetanus.

## 2024-06-10 NOTE — ED Notes (Signed)
 Thumb spica applied

## 2024-06-10 NOTE — Discharge Instructions (Signed)
 Give [facility] call since you have been seen by them in the past.  Information provided above.  Call them to set up a follow-up appointment.  You do have a fracture of the right thumb.  Wear the Velcro splint at all times can remove to shower can removed to do wound care.  Will dress the wound with antibiotic ointment and then a Band-Aid over the top of it twice a day.  After cleaning it with soap and water.  Tetanus was updated today.

## 2024-06-10 NOTE — ED Provider Notes (Addendum)
 [place] EMERGENCY DEPARTMENT AT [facility] Provider Note   CSN: [id number] Arrival date & time: 06/10/24  1157     Patient presents with: Laceration   Thomas Bowman is a 73 y.o. male.   Patient with injury to his posterior part of his right thumb kind of at the MP joint.  Has some skin that was torn off in that area.  No deep laceration.  This was a occurred while working on a trailer and a piece of metal attached to a heavy spring slipped and slammed into his right thumb.  Injury occurred last night.  Patient writes with his right hand.  No other injuries.  Not sure if tetanus is up-to-date.  Patient did clean the wound with hydrogen peroxide last night.  Does have a lot of swelling to the right thumb and a little bit to the thenar eminence on the palm side.  Good extension.  But associated with some pain.  Medical history significant for history of bone cancer prostate cancer facial nerve surgery.       Prior to Admission medications   Medication Sig Start Date End Date Taking? Authorizing Provider  ALPRAZolam (XANAX) 0.25 MG tablet Take 0.25 mg by mouth 2 (two) times daily as needed for anxiety. 08/30/22   [provider]  amLODipine  (NORVASC ) 10 MG tablet Take 10 mg by mouth in the morning. 11/02/19   [provider]  ammonium lactate  (AMLACTIN DAILY) 12 % lotion Apply 1 Application topically as needed for dry skin. 02/13/23   Tobie Franky SQUIBB, DPM  ammonium lactate  (LAC-HYDRIN ) 12 % lotion Apply 1 Application topically as needed for dry skin. 11/07/22   Gaynel Delon LITTIE, DPM  aspirin  EC 81 MG tablet Take 81 mg by mouth 2 (two) times a week.    [provider]  ciclopirox  (LOPROX ) 0.77 % cream Apply to both feet and between toes bid x 6 weeks. Patient not taking: Reported on 09/03/2022 04/13/22   Gaynel Delon LITTIE, DPM  cyclobenzaprine (FLEXERIL) 10 MG tablet Take 10 mg by mouth 2 (two) times daily as needed for muscle spasms. 12/20/21   [provider]  diclofenac  sodium (VOLTAREN ) 1 % GEL Apply 4 g topically 4 (four) times daily. Patient taking differently: Apply 2 g topically 4 (four) times daily as needed (to affected areas- for pain). 06/12/18   Onita Duos, MD  diclofenac  Sodium (VOLTAREN ) 1 % GEL Apply 2 g topically 4 (four) times daily. 09/03/22   Branham, Victoria C, MD  empagliflozin (JARDIANCE) 10 MG TABS tablet Take 10 mg by mouth daily.    [provider]  finasteride (PROSCAR) 5 MG tablet Take 5 mg by mouth at bedtime. 04/11/21   [provider]  glucose blood (TRUE METRIX PRO BLOOD GLUCOSE) test strip 1 each by Other route as needed for other. Use as instructed    [provider]  isosorbide mononitrate (IMDUR) 30 MG 24 hr tablet Take 30 mg by mouth every morning. 08/15/22   [provider]  JANUVIA 100 MG tablet Take 100 mg by mouth every morning. 08/15/22   [provider]  Lancets Thin MISC by Does not apply route. 23 gauge    [provider]  lisinopril  (PRINIVIL ,ZESTRIL ) 40 MG tablet TAKE ONE TABLET BY MOUTH ONCE DAILY Patient taking differently: Take 40 mg by mouth daily. 08/22/16   Parthenia Olivia HERO, PA-C  metoprolol  succinate (TOPROL  XL) 25 MG 24 hr tablet Take 1 tablet (25 mg total)  by mouth daily. Patient not taking: Reported on 09/03/2022 08/16/21   Shlomo Wilbert SAUNDERS, MD    Allergies: Hydralazine  hcl and Sulfa antibiotics    Review of Systems  Constitutional:  Negative for chills and fever.  HENT:  Negative for ear pain and sore throat.   Eyes:  Negative for pain and visual disturbance.  Respiratory:  Negative for cough and shortness of breath.   Cardiovascular:  Negative for chest pain and palpitations.  Gastrointestinal:  Negative for abdominal pain and vomiting.  Genitourinary:  Negative for dysuria and hematuria.  Musculoskeletal:  Positive for joint swelling. Negative for arthralgias and back pain.  Skin:  Negative for color change and rash.   Neurological:  Negative for seizures and syncope.  All other systems reviewed and are negative.   Updated Vital Signs BP (!) 160/80 (BP Location: Right Arm)   Pulse 71   Temp 97.7 F (36.5 C) (Oral)   Resp 18   SpO2 100%   Physical Exam Vitals and nursing note reviewed.  Constitutional:      General: He is not in acute distress.    Appearance: Normal appearance. He is well-developed. He is not ill-appearing.  HENT:     Head: Normocephalic and atraumatic.     Comments: Pre-existing facial deformity secondary to left facial nerve surgery actually on the right and the history of bone cancer in the 90s.  Patient also currently being treated for prostate cancer. Eyes:     Extraocular Movements: Extraocular movements intact.     Conjunctiva/sclera: Conjunctivae normal.     Pupils: Pupils are equal, round, and reactive to light.  Cardiovascular:     Rate and Rhythm: Normal rate and regular rhythm.     Heart sounds: No murmur heard. Pulmonary:     Effort: Pulmonary effort is normal. No respiratory distress.     Breath sounds: Normal breath sounds.  Abdominal:     Palpations: Abdomen is soft.     Tenderness: There is no abdominal tenderness.  Musculoskeletal:        General: Swelling, tenderness and signs of injury present.     Cervical back: Normal range of motion and neck supple.     Comments: Swelling to the right thumb and some of the metacarpal area.  With kind of a skin abrasion skin tear on the dorsal side at the MP phalangeal joint.  Sensation intact distally good cap refill.  Reasonable extension no evidence of an obvious extensor tendon injury.  Limited flexion due to pain over the base of the right thumb.  No other hand injury.  Skin:    General: Skin is warm and dry.     Capillary Refill: Capillary refill takes less than 2 seconds.  Neurological:     Mental Status: He is alert and oriented to person, place, and time. Mental status is at baseline.  Psychiatric:         Mood and Affect: Mood normal.     (all labs ordered are listed, but only abnormal results are displayed) Labs Reviewed - No data to display  EKG: None  Radiology: No results found.   Procedures   Medications Ordered in the ED  Tdap (BOOSTRIX ) injection 0.5 mL (has no administration in time range)                                    Medical Decision Making Amount and/or Complexity of  Data Reviewed Radiology: ordered.  Risk Prescription drug management.   Laceration is not deep.  Is more of a skin avulsion.  But needs x-rays of the right hand to assess the right thumb.  Did clean the wound with Shur-Clens.  Prior to getting the x-ray.  Will need his tetanus updated.  If x-ray negative will go and treat wound with bacitracin ointment and a dressing.  No evidence of any significant flexor or extensor tendon injury at least on initial exam.  X-ray shows evidence of a probably nondisplaced proximal phalanx fracture of the thumb clinically it was suspicious for this.  Will place in a thumb splint.  Have him follow-up with hand surgery.  Final diagnoses:  Injury of right thumb, initial encounter    ED Discharge Orders     None          Geraldene Hamilton, MD 06/10/24 1251    Geraldene Hamilton, MD 06/10/24 1252    Geraldene Hamilton, MD 06/10/24 1328

## 2024-06-11 ENCOUNTER — Ambulatory Visit (HOSPITAL_BASED_OUTPATIENT_CLINIC_OR_DEPARTMENT_OTHER): Admitting: Physician Assistant

## 2024-06-12 ENCOUNTER — Encounter (HOSPITAL_BASED_OUTPATIENT_CLINIC_OR_DEPARTMENT_OTHER): Payer: Self-pay | Admitting: Physician Assistant

## 2024-06-12 ENCOUNTER — Ambulatory Visit (HOSPITAL_BASED_OUTPATIENT_CLINIC_OR_DEPARTMENT_OTHER): Admitting: Physician Assistant

## 2024-06-12 DIAGNOSIS — S62501A Fracture of unspecified phalanx of right thumb, initial encounter for closed fracture: Secondary | ICD-10-CM

## 2024-06-12 NOTE — Progress Notes (Signed)
 Office Visit Note   Patient: Thomas Bowman           Date of Birth: [birth year]           MRN: [id number] Visit Date: 06/12/2024              Requested by: Campbell Reynolds, NP [address] [place],  [place] [address] PCP: Campbell Reynolds, NP   Assessment & Plan: Visit Diagnoses:  1. Closed nondisplaced fracture of phalanx of right thumb, unspecified phalanx, initial encounter     Plan: Patient is a pleasant 73 year old gentleman who presents today 2 days after injury to his right thumb.  He was working on a trailer and a piece of metal attached to the heavy springs slipped and slammed his right thumb.  Was evaluated at an urgent care.  X-rays there demonstrate a nondisplaced fracture of the proximal phalanx of the thumb.  He also has a small abrasion.  There is no evidence of any infection or open fracture.  Ideally would like to put him in a thumb spica cast however because of the abrasion I would be concerned about doing this.  Will place him in a thumb spica splint he understands he is to wear this full-time with the exception of coming out of it a couple times a day to wash his hand with antibacterial soap and replace the Band-Aid.  Will follow-up in 2 weeks with [name].  New x-rays of his thumb should be taken at that time  Follow-Up Instructions: Return in about 2 weeks (around 06/26/2024).   Orders:  No orders of the defined types were placed in this encounter.  No orders of the defined types were placed in this encounter.     Procedures: No procedures performed   Clinical Data: No additional findings.   Subjective: Chief Complaint  Patient presents with   Right Thumb - Pain    HPI pleasant 73 year old gentleman 2 days status post having a piece of metal attached to a heavy spring slipped and slammed into his right thumb.  He is somewhat ambidextrous.  Was seen and evaluated was diagnosed with a fracture of the phalanx of the thumb  Review of Systems  All other systems  reviewed and are negative.    Objective: Vital Signs: There were no vitals taken for this visit.  Physical Exam Constitutional:      Appearance: Normal appearance.  Pulmonary:     Effort: Pulmonary effort is normal.  Skin:    General: Skin is warm and dry.  Neurological:     General: No focal deficit present.     Mental Status: He is alert and oriented to person, place, and time.     Ortho Exam Examination he has full motion of the thumb he is tender over the proximal phalanx and also has a very superficial patient over that area.  He has brisk capillary refill finger is warm.  Rest of his hand is benign.  Pulses are intact Specialty Comments:  No specialty comments available.  Imaging: No results found.   PMFS History: Patient Active Problem List   Diagnosis Date Noted   Fracture of thumb, right, closed 06/12/2024   Pain in right knee 12/02/2023   Benign prostatic hyperplasia 03/06/2022   Gastroesophageal reflux disease 03/06/2022   History of myocardial infarction 03/06/2022   Overweight 03/06/2022   CAD (coronary artery disease) 04/17/2021   Malignant neoplasm of prostate (HCC) 01/10/2021   Bilateral impacted cerumen 04/28/2020   Hearing  loss, sensorineural, asymmetrical 04/28/2020   Tinnitus of right ear 04/28/2020   DM2 (diabetes mellitus, type 2) (HCC) 02/16/2019   Pain in right hand 01/09/2019   Chronic right shoulder pain 03/26/2018   Hearing loss 02/26/2018   Obesity 02/26/2018   Shoulder pain, right 02/26/2018   Tremor 02/26/2018   Vitamin D deficiency 02/26/2018   Excessive daytime sleepiness 09/13/2014   OSA (obstructive sleep apnea) 07/27/2014   Impaired glucose tolerance 05/17/2014   HTN (hypertension) 05/16/2014   Chest pain 05/16/2014   Blepharoedema 06/17/2013   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   Cheek swelling 06/17/2013   Acquired flat foot 05/01/2011   Elevated fasting blood sugar 05/01/2011   Cancer of skeletal  system (HCC) 05/01/2011   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   Essential (primary) hypertension 05/01/2011   Past Medical History:  Diagnosis Date   Bone cancer (HCC) 90s   DM (diabetes mellitus) (HCC)    History of bone cancer    HLD (hyperlipidemia)    Hypertension    Leg pain    Muscle strain of shoulder region    OSA (obstructive sleep apnea)    Overweight    Prostate cancer (HCC)    PSA elevation    Shoulder pain     Family History  Problem Relation Age of Onset   Cancer Mother    Cancer Father    Hypertension Sister    Hypertension Brother    Heart attack Neg Hx    Breast cancer Neg Hx    Prostate cancer Neg Hx    Colon cancer Neg Hx    Pancreatic cancer Neg Hx     Past Surgical History:  Procedure Laterality Date   APPENDECTOMY     CORONARY PRESSURE/FFR STUDY N/A 04/18/2021   Procedure: INTRAVASCULAR PRESSURE WIRE/FFR STUDY;  Surgeon: Claudene Victory ORN, MD;  Location: [facility];  Service: Cardiovascular;  Laterality: N/A;   FACIAL NERVE SURGERY Right    LEFT HEART CATH AND CORONARY ANGIOGRAPHY N/A 04/18/2021   Procedure: LEFT HEART CATH AND CORONARY ANGIOGRAPHY;  Surgeon: Claudene Victory ORN, MD;  Location: [facility];  Service: Cardiovascular;  Laterality: N/A;   PROSTATE BIOPSY     Social History   Occupational History   Not on file  Tobacco Use   Smoking status: Never   Smokeless tobacco: Never  Vaping Use   Vaping status: Never Used  Substance and Sexual Activity   Alcohol use: No   Drug use: No   Sexual activity: Yes

## 2024-06-26 ENCOUNTER — Ambulatory Visit (INDEPENDENT_AMBULATORY_CARE_PROVIDER_SITE_OTHER): Admitting: Student

## 2024-06-26 ENCOUNTER — Ambulatory Visit (INDEPENDENT_AMBULATORY_CARE_PROVIDER_SITE_OTHER)

## 2024-06-26 ENCOUNTER — Encounter (HOSPITAL_BASED_OUTPATIENT_CLINIC_OR_DEPARTMENT_OTHER): Payer: Self-pay | Admitting: Student

## 2024-06-26 DIAGNOSIS — S62501A Fracture of unspecified phalanx of right thumb, initial encounter for closed fracture: Secondary | ICD-10-CM

## 2024-06-26 NOTE — Progress Notes (Signed)
 Chief Complaint: Right thumb pain     History of Present Illness:    Thomas Bowman is a 73 y.o. male who presents today for follow-up evaluation of his right thumb.  He sustained an injury just over 2 weeks ago resulting in a nondisplaced proximal phalanx fracture.  Patient was seen in clinic primary and was placed in a removable thumb spica brace.  States that he does continue to have pain within the thumb that occasionally throbs.  Denies taking any pain medication and reports that he does not like to take medication if not necessary.   Surgical History:   None  PMH/PSH/Family History/Social History/Meds/Allergies:    Past Medical History:  Diagnosis Date   Bone cancer (HCC) 90s   DM (diabetes mellitus) (HCC)    History of bone cancer    HLD (hyperlipidemia)    Hypertension    Leg pain    Muscle strain of shoulder region    OSA (obstructive sleep apnea)    Overweight    Prostate cancer (HCC)    PSA elevation    Shoulder pain    Past Surgical History:  Procedure Laterality Date   APPENDECTOMY     CORONARY PRESSURE/FFR STUDY N/A 04/18/2021   Procedure: INTRAVASCULAR PRESSURE WIRE/FFR STUDY;  Surgeon: Claudene Victory ORN, MD;  Location: [facility];  Service: Cardiovascular;  Laterality: N/A;   FACIAL NERVE SURGERY Right    LEFT HEART CATH AND CORONARY ANGIOGRAPHY N/A 04/18/2021   Procedure: LEFT HEART CATH AND CORONARY ANGIOGRAPHY;  Surgeon: Claudene Victory ORN, MD;  Location: [facility];  Service: Cardiovascular;  Laterality: N/A;   PROSTATE BIOPSY     Social History   Socioeconomic History   Marital status: Single    Spouse name: Not on file   Number of children: Not on file   Years of education: Not on file   Highest education level: Not on file  Occupational History   Not on file  Tobacco Use   Smoking status: Never   Smokeless tobacco: Never  Vaping Use   Vaping status: Never Used  Substance and Sexual Activity    Alcohol use: No   Drug use: No   Sexual activity: Yes  Other Topics Concern   Not on file  Social History Narrative   Not on file   Social Drivers of Health   Financial Resource Strain: Medium Risk (01/01/2024)   Received from [facility]   Overall Financial Resource Strain (CARDIA)    Difficulty of Paying Living Expenses: Somewhat hard  Food Insecurity: Unknown (01/01/2024)   Received from [facility]   Hunger Vital Sign    Worried About Running Out of Food in the Last Year: Not on file    Within the past 12 months, the food you bought just didn't last and you didn't have money to get more.: Never true  Transportation Needs: Unknown (01/01/2024)   Received from [facility]   PRAPARE - Transportation    In the past 12 months, has lack of transportation kept you from medical appointments or from getting medications?: No    Lack of Transportation (Non-Medical): Not on file  Physical Activity: Not on File (02/08/2022)   Received from [facility]   Physical Activity    Physical Activity:  0  Stress: Not on File (02/08/2022)   Received from [facility]   Stress    Stress: 0  Social Connections: Not on File (07/06/2023)   Received from [facility]   Social Connections    Connectedness: 0   Family History  Problem Relation Age of Onset   Cancer Mother    Cancer Father    Hypertension Sister    Hypertension Brother    Heart attack Neg Hx    Breast cancer Neg Hx    Prostate cancer Neg Hx    Colon cancer Neg Hx    Pancreatic cancer Neg Hx    Allergies  Allergen Reactions   Hydralazine  Hcl     Pt experienced severe cramps and aches in hands and legs when dose increased from 25 TID to 50 TID. Resolved upon discontinuation of drug.   Sulfa Antibiotics Rash   Current Outpatient Medications  Medication Sig Dispense Refill   ALPRAZolam (XANAX) 0.25 MG tablet Take 0.25 mg by mouth 2 (two) times daily as needed for anxiety.     amLODipine  (NORVASC )  10 MG tablet Take 10 mg by mouth in the morning.     ammonium lactate  (AMLACTIN DAILY) 12 % lotion Apply 1 Application topically as needed for dry skin. 400 g 0   ammonium lactate  (LAC-HYDRIN ) 12 % lotion Apply 1 Application topically as needed for dry skin. 400 g 5   aspirin  EC 81 MG tablet Take 81 mg by mouth 2 (two) times a week.     ciclopirox  (LOPROX ) 0.77 % cream Apply to both feet and between toes bid x 6 weeks. (Patient not taking: Reported on 09/03/2022) 60 g 1   cyclobenzaprine (FLEXERIL) 10 MG tablet Take 10 mg by mouth 2 (two) times daily as needed for muscle spasms.     diclofenac  sodium (VOLTAREN ) 1 % GEL Apply 4 g topically 4 (four) times daily. (Patient taking differently: Apply 2 g topically 4 (four) times daily as needed (to affected areas- for pain).) 100 g 11   diclofenac  Sodium (VOLTAREN ) 1 % GEL Apply 2 g topically 4 (four) times daily. 50 g 0   empagliflozin (JARDIANCE) 10 MG TABS tablet Take 10 mg by mouth daily.     finasteride (PROSCAR) 5 MG tablet Take 5 mg by mouth at bedtime.     glucose blood (TRUE METRIX PRO BLOOD GLUCOSE) test strip 1 each by Other route as needed for other. Use as instructed     isosorbide mononitrate (IMDUR) 30 MG 24 hr tablet Take 30 mg by mouth every morning.     JANUVIA 100 MG tablet Take 100 mg by mouth every morning.     Lancets Thin MISC by Does not apply route. 23 gauge     lisinopril  (PRINIVIL ,ZESTRIL ) 40 MG tablet TAKE ONE TABLET BY MOUTH ONCE DAILY (Patient taking differently: Take 40 mg by mouth daily.) 90 tablet 3   metoprolol  succinate (TOPROL  XL) 25 MG 24 hr tablet Take 1 tablet (25 mg total) by mouth daily. (Patient not taking: Reported on 09/03/2022) 90 tablet 3   No current facility-administered medications for this visit.   No results found.  Review of Systems:   A ROS was performed including pertinent positives and negatives as documented in the HPI.  Physical Exam :   Constitutional: NAD and appears stated  age Neurological: Alert and oriented Psych: Appropriate affect and cooperative There were no vitals taken for this visit.   Comprehensive Musculoskeletal Exam:    Exam of the  right thumb demonstrates no visible deformity.  There is tenderness particularly at the first MCP and over the proximal phalanx.  Good range of motion of the IP joint.  Prior abrasion shows significant progression of healing.  Distal neurosensory exam is intact.  Radial pulse 2+.  Imaging:   Xray (right hand 3 views): Revisualization of nondisplaced proximal phalanx fracture of the thumb with no significant changes compared to prior radiographs.  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   I personally reviewed and interpreted the radiographs.   Assessment:   73 y.o. male now over 2 weeks status post injury to his right thumb resulting in a nondisplaced proximal phalanx fracture.  He has been immobilized in a thumb spica splint and is tolerating this well overall.  Patient does continue to experience moderate discomfort within the thumb.  We did discuss he can utilize Tylenol  although he does not like to take medications therefore just recommend continuing with brace usage.  He can continue to remove this for hygiene.  I would like to see him back in another 3 weeks to assess healing on x-rays and reassess.  Plan :    - Return to clinic in 3 weeks for repeat x-rays     I personally saw and evaluated the patient, and participated in the management and treatment plan.  [name] Reveal, PA-C Orthopedics

## 2024-07-10 ENCOUNTER — Ambulatory Visit (HOSPITAL_BASED_OUTPATIENT_CLINIC_OR_DEPARTMENT_OTHER): Admitting: Student

## 2024-08-31 ENCOUNTER — Encounter (HOSPITAL_BASED_OUTPATIENT_CLINIC_OR_DEPARTMENT_OTHER): Payer: Self-pay | Admitting: Family Medicine

## 2024-08-31 ENCOUNTER — Ambulatory Visit (HOSPITAL_COMMUNITY)
Admission: EM | Admit: 2024-08-31 | Discharge: 2024-08-31 | Disposition: A | Discharge status: Home / Self Care | Attending: Psychiatry | Admitting: Psychiatry

## 2024-08-31 ENCOUNTER — Ambulatory Visit (INDEPENDENT_AMBULATORY_CARE_PROVIDER_SITE_OTHER): Admitting: Family Medicine

## 2024-08-31 VITALS — BP 180/78 | HR 54 | Temp 97.6°F | Ht 69.0 in | Wt 169.0 lb

## 2024-08-31 DIAGNOSIS — Z1330 Encounter for screening examination for mental health and behavioral disorders, unspecified: Secondary | ICD-10-CM

## 2024-08-31 DIAGNOSIS — Z592 Discord with neighbors, lodgers and landlord: Secondary | ICD-10-CM | POA: Diagnosis not present

## 2024-08-31 DIAGNOSIS — E1165 Type 2 diabetes mellitus with hyperglycemia: Secondary | ICD-10-CM | POA: Diagnosis not present

## 2024-08-31 DIAGNOSIS — I10 Essential (primary) hypertension: Secondary | ICD-10-CM

## 2024-08-31 DIAGNOSIS — Z608 Other problems related to social environment: Secondary | ICD-10-CM | POA: Diagnosis present

## 2024-08-31 MED ORDER — CLONIDINE HCL 0.1 MG PO TABS
0.1000 mg | ORAL_TABLET | Freq: Once | ORAL | Status: AC
  Administered 2024-08-31: 0.1 mg via ORAL
  Filled 2024-08-31: qty 1
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
## 2024-08-31 NOTE — Assessment & Plan Note (Signed)
 Blood pressure notably elevated on initial check.  Did improve slightly on recheck. Unfortunately, patient is not aware of what medications he is taking right now.  As such, cannot make specific treatment recommendations without knowing what he is taking currently. Advised on having patient return to the office soon in order to review blood pressure, medication list and provide specific plan moving forward in regards to blood pressure management Recommend intermittent monitoring of blood pressure at home, DASH diet

## 2024-08-31 NOTE — Discharge Instructions (Signed)
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
## 2024-08-31 NOTE — Progress Notes (Signed)
 New Patient Office Visit  Subjective   Patient ID: Thomas Bowman, male    DOB: 01-22-1951  Age: 73 y.o. MRN: [id number]  CC:  Chief Complaint  Patient presents with   Transitions Of Care    No meds this morning - does not recall what medications he is taking    HPI Thomas Bowman presents to establish care - did not bring his medications - thought he was seeing orthopedist today and not us . Last PCP - Damian [facility] - thinks last appointment was last month  HTN: does not check BP at home.  He is not certain what medications he is taking right now.  He does receive his pills in a package and takes what ever is in there.  No current issues with chest pain.  DM: thinks last A1c was high, does not know number. Notes last discussion with PCP included discussing insulin.  Reports that last PCP did an on oral medication for him to take, not certain what specific medications he is taking at the present time  Follows with [facility] oncologist related to history of prostate cancer.  Sees podiatry for his feet Sees cardiologist through [facility]. Last visit January 2024.  Patient is originally from [place].  Outpatient Encounter Medications as of 08/31/2024  Medication Sig   amLODipine  (NORVASC ) 10 MG tablet Take 10 mg by mouth in the morning.   empagliflozin (JARDIANCE) 10 MG TABS tablet Take 10 mg by mouth daily.   isosorbide mononitrate (IMDUR) 30 MG 24 hr tablet Take 30 mg by mouth every morning.   JANUVIA 100 MG tablet Take 100 mg by mouth every morning.   ammonium lactate  (AMLACTIN DAILY) 12 % lotion Apply 1 Application topically as needed for dry skin.   ammonium lactate  (LAC-HYDRIN ) 12 % lotion Apply 1 Application topically as needed for dry skin.   aspirin  EC 81 MG tablet Take 81 mg by mouth 2 (two) times a week.   ciclopirox  (LOPROX ) 0.77 % cream Apply to both feet and between toes bid x 6 weeks. (Patient not taking: Reported on 09/03/2022)   cyclobenzaprine (FLEXERIL)  10 MG tablet Take 10 mg by mouth 2 (two) times daily as needed for muscle spasms.   diclofenac  sodium (VOLTAREN ) 1 % GEL Apply 4 g topically 4 (four) times daily. (Patient taking differently: Apply 2 g topically 4 (four) times daily as needed (to affected areas- for pain).)   diclofenac  Sodium (VOLTAREN ) 1 % GEL Apply 2 g topically 4 (four) times daily.   finasteride (PROSCAR) 5 MG tablet Take 5 mg by mouth at bedtime.   glucose blood (TRUE METRIX PRO BLOOD GLUCOSE) test strip 1 each by Other route as needed for other. Use as instructed   Lancets Thin MISC by Does not apply route. 23 gauge   lisinopril  (PRINIVIL ,ZESTRIL ) 40 MG tablet TAKE ONE TABLET BY MOUTH ONCE DAILY (Patient taking differently: Take 40 mg by mouth daily.)   metoprolol  succinate (TOPROL  XL) 25 MG 24 hr tablet Take 1 tablet (25 mg total) by mouth daily. (Patient not taking: Reported on 09/03/2022)   [DISCONTINUED] ALPRAZolam (XANAX) 0.25 MG tablet Take 0.25 mg by mouth 2 (two) times daily as needed for anxiety.   No facility-administered encounter medications on file as of 08/31/2024.    Past Medical History:  Diagnosis Date   Bone cancer (HCC) 90s   DM (diabetes mellitus) (HCC)    History of bone cancer    HLD (hyperlipidemia)  Hypertension    Leg pain    Muscle strain of shoulder region    OSA (obstructive sleep apnea)    Overweight    Prostate cancer (HCC)    PSA elevation    Shoulder pain     Past Surgical History:  Procedure Laterality Date   APPENDECTOMY     CORONARY PRESSURE/FFR STUDY N/A 04/18/2021   Procedure: INTRAVASCULAR PRESSURE WIRE/FFR STUDY;  Surgeon: Claudene Victory ORN, MD;  Location: [facility];  Service: Cardiovascular;  Laterality: N/A;   FACIAL NERVE SURGERY Right    LEFT HEART CATH AND CORONARY ANGIOGRAPHY N/A 04/18/2021   Procedure: LEFT HEART CATH AND CORONARY ANGIOGRAPHY;  Surgeon: Claudene Victory ORN, MD;  Location: [facility];  Service: Cardiovascular;  Laterality: N/A;    PROSTATE BIOPSY      Family History  Problem Relation Age of Onset   Cancer Mother    Cancer Father    Hypertension Sister    Hypertension Brother    Heart attack Neg Hx    Breast cancer Neg Hx    Prostate cancer Neg Hx    Colon cancer Neg Hx    Pancreatic cancer Neg Hx     Social History   Socioeconomic History   Marital status: Single    Spouse name: Not on file   Number of children: Not on file   Years of education: Not on file   Highest education level: Not on file  Occupational History   Not on file  Tobacco Use   Smoking status: Never   Smokeless tobacco: Never  Vaping Use   Vaping status: Never Used  Substance and Sexual Activity   Alcohol use: No   Drug use: No   Sexual activity: Yes  Other Topics Concern   Not on file  Social History Narrative   Not on file   Social Drivers of Health   Financial Resource Strain: Medium Risk (01/01/2024)   Received [facility]   Overall Financial Resource Strain (CARDIA)    Difficulty of Paying Living Expenses: Somewhat hard  Food Insecurity: Unknown (01/01/2024)   Received from [facility]   Hunger Vital Sign    Worried About Running Out of Food in the Last Year: Not on file    Within the past 12 months, the food you bought just didn't last and you didn't have money to get more.: Never true  Transportation Needs: Unknown (01/01/2024)   Received from [facility]   PRAPARE - Transportation    In the past 12 months, has lack of transportation kept you from medical appointments or from getting medications?: No    Lack of Transportation (Non-Medical): Not on file  Physical Activity: Not on File (02/08/2022)   Received from [facility]   Physical Activity    Physical Activity: 0  Stress: Not on File (02/08/2022)   Received from [facility]   Stress    Stress: 0  Social Connections: Not on File (07/06/2023)   Received from [facility]   Social Connections    Connectedness: 0   Intimate Partner Violence: Not on file    Objective   BP (!) 180/78 (BP Location: Left Arm, Patient Position: Sitting, Cuff Size: Normal)   Pulse (!) 54   Temp 97.6 F (36.4 C) (Oral)   Ht 5' 9 (1.753 m)   Wt 169 lb (76.7 kg)   SpO2 99%   BMI 24.96 kg/m   Physical Exam  73 year old male  in no acute distress Cardiovascular exam with regular rate and rhythm Lungs clear to auscultation bilaterally  Assessment & Plan:   Type 2 diabetes mellitus with hyperglycemia, without long-term current use of insulin (HCC) Assessment & Plan: Patient uncertain what medications he is taking at this time.  Unfortunately, difficult to be able to make specific management recommendations without this knowledge. He does note that prior A1c was elevated, we can check this here in the office today.  We can also check urine ACR as he does not recall the last time that this was done. Will plan for close follow-up in the next few weeks.  Advised on bringing in medication list so that we can review and make more specific recommendations regarding management.  Orders: -     Basic metabolic panel with GFR -     Hemoglobin A1c -     Microalbumin / creatinine urine ratio  Essential (primary) hypertension Assessment & Plan: Blood pressure notably elevated on initial check.  Did improve slightly on recheck. Unfortunately, patient is not aware of what medications he is taking right now.  As such, cannot make specific treatment recommendations without knowing what he is taking currently. Advised on having patient return to the office soon in order to review blood pressure, medication list and provide specific plan moving forward in regards to blood pressure management Recommend intermittent monitoring of blood pressure at home, DASH diet  Orders: -     Basic metabolic panel with GFR -     Microalbumin / creatinine urine ratio  Return in about 3 weeks (around 09/21/2024).   Spent 48 minutes on this patient  encounter, including preparation, chart review, face-to-face counseling with patient and coordination of care, and documentation of encounter   ___________________________________________ Jathen Sudano de [place], MD, ABFM, [facility] Primary Care and Sports Medicine [facility]

## 2024-08-31 NOTE — Assessment & Plan Note (Signed)
 Patient uncertain what medications he is taking at this time.  Unfortunately, difficult to be able to make specific management recommendations without this knowledge. He does note that prior A1c was elevated, we can check this here in the office today.  We can also check urine ACR as he does not recall the last time that this was done. Will plan for close follow-up in the next few weeks.  Advised on bringing in medication list so that we can review and make more specific recommendations regarding management.

## 2024-09-01 LAB — MICROALBUMIN / CREATININE URINE RATIO
Creatinine, Urine: 118.6 mg/dL
Microalb/Creat Ratio: 8 mg/g{creat} (ref 0–29)
Microalbumin, Urine: 9.7 ug/mL

## 2024-09-01 LAB — BASIC METABOLIC PANEL WITH GFR
BUN/Creatinine Ratio: 15 (ref 10–24)
BUN: 17 mg/dL (ref 8–27)
CO2: 20 mmol/L (ref 20–29)
Calcium: 9.2 mg/dL (ref 8.6–10.2)
Chloride: 100 mmol/L (ref 96–106)
Creatinine, Ser: 1.14 mg/dL (ref 0.76–1.27)
Glucose: 199 mg/dL — ABNORMAL HIGH (ref 70–99)
Potassium: 5.2 mmol/L (ref 3.5–5.2)
Sodium: 136 mmol/L (ref 134–144)
eGFR: 68 mL/min/1.73 (ref >59)

## 2024-09-01 LAB — HEMOGLOBIN A1C
Est. average glucose Bld gHb Est-mCnc: 255 mg/dL
Hgb A1c MFr Bld: 10.5 % — ABNORMAL HIGH (ref 4.8–5.6)

## 2024-09-04 ENCOUNTER — Ambulatory Visit (HOSPITAL_BASED_OUTPATIENT_CLINIC_OR_DEPARTMENT_OTHER): Payer: Self-pay | Admitting: Family Medicine

## 2024-09-09 ENCOUNTER — Ambulatory Visit (INDEPENDENT_AMBULATORY_CARE_PROVIDER_SITE_OTHER): Admitting: Family Medicine

## 2024-09-09 ENCOUNTER — Encounter (HOSPITAL_BASED_OUTPATIENT_CLINIC_OR_DEPARTMENT_OTHER): Payer: Self-pay | Admitting: Family Medicine

## 2024-09-09 VITALS — BP 162/82 | HR 74 | Ht 69.0 in | Wt 166.6 lb

## 2024-09-09 DIAGNOSIS — E1165 Type 2 diabetes mellitus with hyperglycemia: Secondary | ICD-10-CM

## 2024-09-09 MED ORDER — OZEMPIC (0.25 OR 0.5 MG/DOSE) 2 MG/3ML ~~LOC~~ SOPN: 0.2500 mg | PEN_INJECTOR | SUBCUTANEOUS | 1 refills | Status: DC

## 2024-09-09 NOTE — Assessment & Plan Note (Signed)
 Patient returns today due to notably elevated A1c on recent labs with hemoglobin A1c at 10.5%.  He is currently only taking Januvia. We discussed consideration related to medication management.  Given notable elevation in A1c, discussed need for making a change to her current pharmacotherapy.  I would also agree with suggestion by his last provider regarding need to potentially include insulin.  We also discussed GLP-1 medication as an option.  After discussion, he would prefer to see about starting GLP-1, we did review potential side effects with this class of medication, we discussed potential concerns related to cost as well as this is a newer class of medication and is not always covered as well by insurance. We will proceed with Ozempic as below.  He does have appointment scheduled in about a month and we will keep that appointment in order to follow-up on medication.  Did offer for patient to come in for nurse visit to assist with first injection if preferred.  He will send us  a message or call us  to schedule this if he would like. Plan to complete foot exam at future office visit

## 2024-09-09 NOTE — Progress Notes (Signed)
    Procedures performed today:    None.  Independent interpretation of notes and tests performed by another provider:   None.  Brief History, Exam, Impression, and Recommendations:    BP (!) 162/82   Pulse 74   Ht 5' 9 (1.753 m)   Wt 166 lb 9.6 oz (75.6 kg)   SpO2 99%   BMI 24.60 kg/m   Patient did bring list of medications with him today.  This indicates that he is currently taking 6 prescription medications.  His medication list has been updated reportedly.  In regard to diabetes, he is only taking Januvia at present.  Type 2 diabetes mellitus with hyperglycemia, without long-term current use of insulin (HCC) Assessment & Plan: Patient returns today due to notably elevated A1c on recent labs with hemoglobin A1c at 10.5%.  He is currently only taking Januvia. We discussed consideration related to medication management.  Given notable elevation in A1c, discussed need for making a change to her current pharmacotherapy.  I would also agree with suggestion by his last provider regarding need to potentially include insulin.  We also discussed GLP-1 medication as an option.  After discussion, he would prefer to see about starting GLP-1, we did review potential side effects with this class of medication, we discussed potential concerns related to cost as well as this is a newer class of medication and is not always covered as well by insurance. We will proceed with Ozempic as below.  He does have appointment scheduled in about a month and we will keep that appointment in order to follow-up on medication.  Did offer for patient to come in for nurse visit to assist with first injection if preferred.  He will send us  a message or call us  to schedule this if he would like. Plan to complete foot exam at future office visit  Orders: -     Ozempic (0.25 or 0.5 MG/DOSE); Inject 0.25 mg into the skin once a week.  Dispense: 3 mL; Refill: 1  Return for Appointment in 1 month.  Spent 32 minutes on  this patient encounter, including preparation, chart review, face-to-face counseling with patient and coordination of care, and documentation of encounter   ___________________________________________ Antonia Culbertson de [place], MD, ABFM, [facility] Primary Care and Sports Medicine [facility]

## 2024-09-09 NOTE — Patient Instructions (Signed)
  Medication Instructions:  Your physician recommends that you continue on your current medications as directed. Please refer to the Current Medication list given to you today. --If you need a refill on any your medications before your next appointment, please call your pharmacy first. If no refills are authorized on file call the office.-- Lab Work: Your physician has recommended that you have lab work today:  If you have labs (blood work) drawn today and your tests are completely normal, you will receive your results via MyChart message OR a phone call from our staff.  Please ensure you check your voicemail in the event that you authorized detailed messages to be left on a delegated number. If you have any lab test that is abnormal or we need to change your treatment, we will call you to review the results.  Referrals/Procedures/Imaging:   Follow-Up: Your next appointment:   Your physician recommends that you schedule a follow-up appointment in: as scheduled with Dr. de Cuba  You will receive a text message or e-mail with a link to a survey about your care and experience with us  today! We would greatly appreciate your feedback!   Thanks for letting us  be apart of your health journey!!  Primary Care and Sports Medicine   Dr. Quintin sheerer Cuba   We encourage you to activate your patient portal called MyChart.  Sign up information is provided on this After Visit Summary.  MyChart is used to connect with patients for Virtual Visits (Telemedicine).  Patients are able to view lab/test results, encounter notes, upcoming appointments, etc.  Non-urgent messages can be sent to your provider as well. To learn more about what you can do with MyChart, please visit --  [email or web address].

## 2024-09-10 ENCOUNTER — Ambulatory Visit (INDEPENDENT_AMBULATORY_CARE_PROVIDER_SITE_OTHER): Admitting: Student

## 2024-09-10 ENCOUNTER — Ambulatory Visit (HOSPITAL_BASED_OUTPATIENT_CLINIC_OR_DEPARTMENT_OTHER)

## 2024-09-10 DIAGNOSIS — G8929 Other chronic pain: Secondary | ICD-10-CM | POA: Diagnosis not present

## 2024-09-10 DIAGNOSIS — M79644 Pain in right finger(s): Secondary | ICD-10-CM

## 2024-09-10 DIAGNOSIS — S62501A Fracture of unspecified phalanx of right thumb, initial encounter for closed fracture: Secondary | ICD-10-CM

## 2024-09-10 NOTE — Progress Notes (Signed)
 Chief Complaint: Right thumb pain     History of Present Illness:   09/10/24: Thomas Bowman presents to clinic today for follow-up evaluation of his right thumb.  He does continue to have pain with motion of the thumb and states that he feels like something is sticking.  He feels as though there is some decrease sensation within the thumb.  He is not wearing a brace today and reports that he discontinued this approximately 1 week ago.   06/26/24: Thomas Bowman is a 73 y.o. male who presents today for follow-up evaluation of his right thumb.  He sustained an injury just over 2 weeks ago resulting in a nondisplaced proximal phalanx fracture.  Patient was seen in clinic primary and was placed in a removable thumb spica brace.  States that he does continue to have pain within the thumb that occasionally throbs.  Denies taking any pain medication and reports that he does not like to take medication if not necessary.   Surgical History:   None  PMH/PSH/Family History/Social History/Meds/Allergies:    Past Medical History:  Diagnosis Date   Bone cancer (HCC) 90s   DM (diabetes mellitus) (HCC)    History of bone cancer    HLD (hyperlipidemia)    Hypertension    Leg pain    Muscle strain of shoulder region    OSA (obstructive sleep apnea)    Overweight    Prostate cancer (HCC)    PSA elevation    Shoulder pain    Past Surgical History:  Procedure Laterality Date   APPENDECTOMY     CORONARY PRESSURE/FFR STUDY N/A 04/18/2021   Procedure: INTRAVASCULAR PRESSURE WIRE/FFR STUDY;  Surgeon: Claudene Victory ORN, MD;  Location: [facility];  Service: Cardiovascular;  Laterality: N/A;   FACIAL NERVE SURGERY Right    LEFT HEART CATH AND CORONARY ANGIOGRAPHY N/A 04/18/2021   Procedure: LEFT HEART CATH AND CORONARY ANGIOGRAPHY;  Surgeon: Claudene Victory ORN, MD;  Location: [facility];  Service: Cardiovascular;  Laterality: N/A;   PROSTATE BIOPSY     Social History    Socioeconomic History   Marital status: Single    Spouse name: Not on file   Number of children: Not on file   Years of education: Not on file   Highest education level: Not on file  Occupational History   Not on file  Tobacco Use   Smoking status: Never   Smokeless tobacco: Never  Vaping Use   Vaping status: Never Used  Substance and Sexual Activity   Alcohol use: No   Drug use: No   Sexual activity: Yes  Other Topics Concern   Not on file  Social History Narrative   Not on file   Social Drivers of Health   Financial Resource Strain: Medium Risk (01/01/2024)   Received [facility]   Overall Financial Resource Strain (CARDIA)    Difficulty of Paying Living Expenses: Somewhat hard  Food Insecurity: Unknown (01/01/2024)   Received from [facility]   Hunger Vital Sign    Worried About Running Out of Food in the Last Year: Not on file    Within the past 12 months, the food you bought just didn't last and you didn't have money to get more.: Never true  Transportation Needs: Unknown (  01/01/2024)   Received from [facility]   PRAPARE - Transportation    In the past 12 months, has lack of transportation kept you from medical appointments or from getting medications?: No    Lack of Transportation (Non-Medical): Not on file  Physical Activity: Not on File (02/08/2022)   [facility]   Physical Activity    Physical Activity: 0  Stress: Not on File (02/08/2022)   Received from [facility]   Stress    Stress: 0  Social Connections: Not on File (07/06/2023)   Received from [facility]   Social Connections    Connectedness: 0   Family History  Problem Relation Age of Onset   Cancer Mother    Cancer Father    Hypertension Sister    Hypertension Brother    Heart attack Neg Hx    Breast cancer Neg Hx    Prostate cancer Neg Hx    Colon cancer Neg Hx    Pancreatic cancer Neg Hx    Allergies  Allergen Reactions    Hydralazine  Hcl     Pt experienced severe cramps and aches in hands and legs when dose increased from 25 TID to 50 TID. Resolved upon discontinuation of drug.   Sulfa Antibiotics Rash   Current Outpatient Medications  Medication Sig Dispense Refill   amLODipine  (NORVASC ) 10 MG tablet Take 10 mg by mouth in the morning.     ammonium lactate  (AMLACTIN DAILY) 12 % lotion Apply 1 Application topically as needed for dry skin. 400 g 0   ammonium lactate  (LAC-HYDRIN ) 12 % lotion Apply 1 Application topically as needed for dry skin. 400 g 5   aspirin  EC 81 MG tablet Take 81 mg by mouth 2 (two) times a week.     ciclopirox  (LOPROX ) 0.77 % cream Apply to both feet and between toes bid x 6 weeks. (Patient not taking: Reported on 09/03/2022) 60 g 1   cyclobenzaprine (FLEXERIL) 10 MG tablet Take 10 mg by mouth 2 (two) times daily as needed for muscle spasms. (Patient not taking: Reported on 09/09/2024)     diclofenac  sodium (VOLTAREN ) 1 % GEL Apply 4 g topically 4 (four) times daily. (Patient taking differently: Apply 2 g topically 4 (four) times daily as needed (to affected areas- for pain).) 100 g 11   diclofenac  Sodium (VOLTAREN ) 1 % GEL Apply 2 g topically 4 (four) times daily. 50 g 0   finasteride (PROSCAR) 5 MG tablet Take 5 mg by mouth at bedtime.     glucose blood (TRUE METRIX PRO BLOOD GLUCOSE) test strip 1 each by Other route as needed for other. Use as instructed     isosorbide mononitrate (IMDUR) 30 MG 24 hr tablet Take 30 mg by mouth every morning.     Lancets Thin MISC by Does not apply route. 23 gauge     lisinopril  (PRINIVIL ,ZESTRIL ) 40 MG tablet TAKE ONE TABLET BY MOUTH ONCE DAILY (Patient taking differently: Take 40 mg by mouth daily.) 90 tablet 3   metoprolol  succinate (TOPROL  XL) 25 MG 24 hr tablet Take 1 tablet (25 mg total) by mouth daily. (Patient not taking: Reported on 09/09/2024) 90 tablet 3   Semaglutide,0.25 or 0.5MG /DOS, (OZEMPIC, 0.25 OR 0.5 MG/DOSE,) 2 MG/3ML SOPN Inject 0.25 mg  into the skin once a week. 3 mL 1   No current facility-administered medications for this visit.   No results found.  Review of Systems:   A ROS was performed including pertinent positives and negatives  as documented in the HPI.  Physical Exam :   Constitutional: NAD and appears stated age Neurological: Alert and oriented Psych: Appropriate affect and cooperative There were no vitals taken for this visit.   Comprehensive Musculoskeletal Exam:    Right thumb exam demonstrates no visible or palpable deformity.  Mild tenderness with palpation over both the proximal and distal phalanx of the thumb.  Limited range of motion at the MCP and IP compared to contralateral side.  Decreased sensation noted over the dorsal aspect of the thumb.  Brisk capillary refill distally.  Imaging:   Xray (right hand 3 views): Nondisplaced proximal phalanx fracture of the thumb with decreased fracture line visualization and progression of callus formation.  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   I personally reviewed and interpreted the radiographs.   Assessment:   73 y.o. male 3 months status post right thumb injury in the setting of a nondisplaced proximal phalanx fracture.  We have managed this conservatively with use of thumb spica splint which he has recently discontinued.  Patient has not been seen since 9/5 for evaluation and x-rays.  Radiographs taken today do show continued healing within the proximal phalanx which is reassuring.  Discussed that I would like to have him work with occupational therapy in order to help improve pain and function.  If patient fails to progress with this, would have patient evaluated by Dr. Arlinda to determine next steps.  If symptoms fail to progress, I would like to see patient back in another 6 weeks.  Plan :    - Referral to Occupational Therapy for right thumb evaluation and treatment     I personally saw and evaluated the patient, and  participated in the management and treatment plan.  [name] Reveal, PA-C Orthopedics

## 2024-09-23 ENCOUNTER — Ambulatory Visit: Attending: Student | Admitting: Occupational Therapy

## 2024-09-23 DIAGNOSIS — R208 Other disturbances of skin sensation: Secondary | ICD-10-CM | POA: Insufficient documentation

## 2024-09-23 DIAGNOSIS — S62515A Nondisplaced fracture of proximal phalanx of left thumb, initial encounter for closed fracture: Secondary | ICD-10-CM | POA: Insufficient documentation

## 2024-09-23 DIAGNOSIS — S62501A Fracture of unspecified phalanx of right thumb, initial encounter for closed fracture: Secondary | ICD-10-CM | POA: Insufficient documentation

## 2024-09-23 DIAGNOSIS — M6281 Muscle weakness (generalized): Secondary | ICD-10-CM | POA: Diagnosis present

## 2024-09-23 DIAGNOSIS — M79644 Pain in right finger(s): Secondary | ICD-10-CM | POA: Insufficient documentation

## 2024-09-23 DIAGNOSIS — M25649 Stiffness of unspecified hand, not elsewhere classified: Secondary | ICD-10-CM | POA: Insufficient documentation

## 2024-09-23 DIAGNOSIS — R278 Other lack of coordination: Secondary | ICD-10-CM | POA: Insufficient documentation

## 2024-09-23 NOTE — Therapy (Signed)
 OUTPATIENT OCCUPATIONAL THERAPY ORTHO EVALUATION  Patient Name: BARAN KUHRT MRN: [id number] DOB:24-Mar-1951, 73 y.o., male Today's Date: 09/23/2024  PCP: De [place], Quintin PARAS, MD  REFERRING PROVIDER: Emiliano [name] LITTIE, PA   END OF SESSION:  OT End of Session - 09/23/24 1156     Visit Number 1    Number of Visits 13   12 visits plus eval   Date for Recertification  11/05/23    Authorization Type [employer]    OT Start Time 0930    OT Stop Time 1013    OT Time Calculation (min) 43 min    Activity Tolerance Patient tolerated treatment well    Behavior During Therapy WFL for tasks assessed/performed          Past Medical History:  Diagnosis Date   Bone cancer (HCC) 90s   DM (diabetes mellitus) (HCC)    History of bone cancer    HLD (hyperlipidemia)    Hypertension    Leg pain    Muscle strain of shoulder region    OSA (obstructive sleep apnea)    Overweight    Prostate cancer (HCC)    PSA elevation    Shoulder pain    Past Surgical History:  Procedure Laterality Date   APPENDECTOMY     CORONARY PRESSURE/FFR STUDY N/A 04/18/2021   Procedure: INTRAVASCULAR PRESSURE WIRE/FFR STUDY;  Surgeon: Claudene Victory ORN, MD;  Location: [facility];  Service: Cardiovascular;  Laterality: N/A;   FACIAL NERVE SURGERY Right    LEFT HEART CATH AND CORONARY ANGIOGRAPHY N/A 04/18/2021   Procedure: LEFT HEART CATH AND CORONARY ANGIOGRAPHY;  Surgeon: Claudene Victory ORN, MD;  Location: [facility];  Service: Cardiovascular;  Laterality: N/A;   PROSTATE BIOPSY     Patient Active Problem List   Diagnosis Date Noted   Fracture of thumb, right, closed 06/12/2024   Pain in right knee 12/02/2023   Benign prostatic hyperplasia 03/06/2022   Gastroesophageal reflux disease 03/06/2022   History of myocardial infarction 03/06/2022   Overweight 03/06/2022   CAD (coronary artery disease) 04/17/2021   Malignant neoplasm of prostate (HCC) 01/10/2021   Bilateral impacted cerumen  04/28/2020   Hearing loss, sensorineural, asymmetrical 04/28/2020   Tinnitus of right ear 04/28/2020   Diabetes mellitus (HCC) 02/16/2019   Pain in right hand 01/09/2019   Chronic right shoulder pain 03/26/2018   Hearing loss 02/26/2018   Obesity 02/26/2018   Shoulder pain, right 02/26/2018   Tremor 02/26/2018   Vitamin D deficiency 02/26/2018   Excessive daytime sleepiness 09/13/2014   OSA (obstructive sleep apnea) 07/27/2014   Impaired glucose tolerance 05/17/2014   HTN (hypertension) 05/16/2014   Chest pain 05/16/2014   Blepharoedema 06/17/2013   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   Cheek swelling 06/17/2013   Acquired flat foot 05/01/2011   Elevated fasting blood sugar 05/01/2011   Cancer of skeletal system (HCC) 05/01/2011   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   Essential (primary) hypertension 05/01/2011    ONSET DATE: 06/10/24  REFERRING DIAG: nondisplaced proximal phalanx fracture  THERAPY DIAG:  Nondisplaced fracture of proximal phalanx of left thumb, initial encounter for closed fracture  Pain of right thumb  Other disturbances of skin sensation  Stiffness of thumb joint  Rationale for Evaluation and Treatment: Rehabilitation  SUBJECTIVE:   SUBJECTIVE STATEMENT: Pt stating he just pushes through and keeps going with life. 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 Reports pain is just at  the very tip of his R thumb, not at the site of the fracture.  Pt accompanied by: self  PERTINENT HISTORY: hx of cancer   PRECAUTIONS: None  RED FLAGS: Hs of cancer, estim/US  contraindicated    WEIGHT BEARING RESTRICTIONS: No  PAIN:  Are you having pain? Yes: NPRS scale: 5/10 at rest and 7/10 with use  Pain location: distal tip of R thumb  Pain description: like its burned at the end Aggravating factors: use of R hand  Relieving factors: rest, nonuse  FALLS: Has patient fallen in  last 6 months? No  LIVING ENVIRONMENT: Lives with: lives with friends Lives in: House/apartment Stairs: No Has following equipment at home: None  PLOF: Independent, Independent with basic ADLs, and Independent with transfers  PATIENT GOALS: Pt wants to be able to do daily tasks (working mostly) with reduced pain.   NEXT MD VISIT: unsure, sometime January   OBJECTIVE:  Note: Objective measures were completed at Evaluation unless otherwise noted.  HAND DOMINANCE: Ambidextrous but eats and writes with R hand  ADLs: Overall ADLs: MOD I but reports sometimes leaving buttons undone or working with a zipper until he gets it   FUNCTIONAL OUTCOME MEASURES: Upper Extremity Functional Scale (UEFS): 21/80    26.3%     UPPER EXTREMITY ROM:     Active ROM Right eval Left eval  Shoulder flexion [facility]   Shoulder abduction    Shoulder adduction    Shoulder extension    Shoulder internal rotation    Shoulder external rotation    Elbow flexion WFL    Elbow extension WFL   Wrist flexion 40   Wrist extension 25   Wrist ulnar deviation 20   Wrist radial deviation 15   Wrist pronation    Wrist supination    (Blank rows = not tested)  Active ROM Right eval Left eval  Thumb MCP (0-60) 50   Thumb IP (0-80) 30   Thumb Radial abd/add (0-55) 30    Thumb Palmar abd/add (0-45)     Thumb Opposition to Small Finger WFL    Index MCP (0-90)     Index PIP (0-100)     Index DIP (0-70)      Long MCP (0-90)      Long PIP (0-100)      Long DIP (0-70)      Ring MCP (0-90)      Ring PIP (0-100)      Ring DIP (0-70)      Little MCP (0-90)      Little PIP (0-100)      Little DIP (0-70)      (Blank rows = not tested)   UPPER EXTREMITY MMT:     MMT Right eval Left eval  Shoulder flexion [facility]  Shoulder abduction    Shoulder adduction    Shoulder extension    Shoulder internal rotation    Shoulder external rotation    Middle trapezius    Lower trapezius    Elbow flexion     Elbow extension    Wrist flexion    Wrist extension    Wrist ulnar deviation    Wrist radial deviation    Wrist pronation    Wrist supination    (Blank rows = not tested)  HAND FUNCTION: Grip strength: Right: 58.8 lbs; Left: 68.3 lbs  COORDINATION: 9 Hole Peg test: Right: 27.21 sec; Left: 32.4 sec  SENSATION: 2 point discrimination test: deficits beginning at level 4  EDEMA: slight,  mild edema in R han d  COGNITION: Overall cognitive status: Within functional limits for tasks assessed Areas of impairment: none  OBSERVATIONS: Pt is a well appearing 73 yo male who sustained an injury to his R hand while working on a trailer on 06/10/24, resulting in nondisplaced proximal phalanx fracture. Nonoperative management recommended. Pt now with no splint but continues to have significant pain in R thumb at distal most end, potentially presenting with nerve pain. Pt would benefit from skilled OT to address R hand/thumb stiffness and pain to improve ability to perform IADLs and functional tasks.    TREATMENT DATE: 09/23/24 Pt seen this date for OT evaluation and treatment session. Educated on fracture and healing progress. Reviewed briefly joint protection techniques and modifications but would benefit from continued education.                                                                                                                             Modalities: Heat, ultrasound, paraffin, fluidotherapy     PATIENT EDUCATION: Education details: initiated but would benefit from continued education  Person educated: Patient Education method: Medical Illustrator Education comprehension: verbalized understanding and needs further education  HOME EXERCISE PROGRAM: TBD - joint protection, thumb ROM/static stretches  GOALS: Goals reviewed with patient? Yes  SHORT TERM GOALS: Target date: 10/14/24  Pt will report adhering to joint protection techniques with work tasks with 75%  accuracy  Baseline: Goal status: INITIAL  2.  Pt will be supervision with HEP for R hand ROM/stretches to improve ROM at all appropriate joints. Baseline: deficits mostly at wrist and thumb MP, IP Goal status: INITIAL  3.  Pt will report improved pain levels to no more than 5/10 with functional use and activity Baseline:  Goal status: INITIAL  4.  In conjunction with the pt, OT will initiate education on AE such as button hook/zipper pull with pt to improve ability to perform fine ADL tasks Baseline:  Goal status: INITIAL    LONG TERM GOALS: Target date: 11/05/23  Pt will report adhering to joint protection techniques with work tasks with 100% accuracy  Baseline:  Goal status: INITIAL  2.  Pt will be MOD I with HEP for R hand ROM/stretches to improve ROM at all appropriate joints. Baseline:  Goal status: INITIAL  3.  Pt will report improved pain levels to no more than 2/10 with functional use and activity Baseline:  Goal status: INITIAL  4.  Pt will report ability to return to IADL tasks such as cooking a meal with adaptive techniques as needed with improved functional outcomes Baseline:  Goal status: INITIAL    ASSESSMENT:  CLINICAL IMPRESSION: Patient is a 73 y.o. male who was seen today for occupational therapy evaluation for nondisplaced proximal phalanx fracture from 8/20. Pt with conservative management and no longer in splint but continues to have lasting pain at distal portion of thumb as well as significant Rom limitations. Pt would benefit  from skilled OT to address ROM and strength deficits to improve functional outcomes.   PERFORMANCE DEFICITS: in functional skills including ADLs, IADLs, coordination, sensation, ROM, strength, and pain, cognitive skills including none, and psychosocial skills including none.   IMPAIRMENTS: are limiting patient from ADLs, IADLs, work, and leisure.   COMORBIDITIES: may have co-morbidities  that affects occupational performance.  Patient will benefit from skilled OT to address above impairments and improve overall function.  MODIFICATION OR ASSISTANCE TO COMPLETE EVALUATION: Min-Moderate modification of tasks or assist with assess necessary to complete an evaluation.  OT OCCUPATIONAL PROFILE AND HISTORY: Detailed assessment: Review of records and additional review of physical, cognitive, psychosocial history related to current functional performance.  CLINICAL DECISION MAKING: Moderate - several treatment options, min-mod task modification necessary  REHAB POTENTIAL: Good  EVALUATION COMPLEXITY: Moderate      PLAN:  OT FREQUENCY: 2x/week  OT DURATION: 6 weeks  PLANNED INTERVENTIONS: 97168 OT Re-evaluation, 97535 self care/ADL training, 02889 therapeutic exercise, 97530 therapeutic activity, 97112 neuromuscular re-education, 97140 manual therapy, 97035 ultrasound, 97018 paraffin, 02960 fluidotherapy, 97010 moist heat, 97034 contrast bath, passive range of motion, patient/family education, and DME and/or AE instructions  RECOMMENDED OTHER SERVICES: none  CONSULTED AND AGREED WITH PLAN OF CARE: Patient  PLAN FOR NEXT SESSION: fluidotherapy/moist heat for prep, ROM for R wrist and digits focusing on R thumb MP and IP, maybe densensitization with rice/sticks for R thumb, light strengthening for R hand, joint protectin techniques!! He works hard and pushes through pain   Chiquita JAYSON Hopping, OT 09/23/2024, 11:58 AM

## 2024-09-29 ENCOUNTER — Ambulatory Visit

## 2024-09-29 DIAGNOSIS — S62515A Nondisplaced fracture of proximal phalanx of left thumb, initial encounter for closed fracture: Secondary | ICD-10-CM

## 2024-09-29 DIAGNOSIS — M79644 Pain in right finger(s): Secondary | ICD-10-CM

## 2024-09-29 DIAGNOSIS — R278 Other lack of coordination: Secondary | ICD-10-CM

## 2024-09-29 DIAGNOSIS — M6281 Muscle weakness (generalized): Secondary | ICD-10-CM

## 2024-09-29 DIAGNOSIS — M25649 Stiffness of unspecified hand, not elsewhere classified: Secondary | ICD-10-CM

## 2024-09-29 NOTE — Therapy (Signed)
 OUTPATIENT OCCUPATIONAL THERAPY ORTHO TREATMENT  Patient Name: Thomas Bowman MRN: [id number] DOB:[birth year], 73 y.o., male Today's Date: 09/29/2024  PCP: De [place], Quintin PARAS, MD  REFERRING PROVIDER: Emiliano [name] LITTIE, PA   END OF SESSION:  OT End of Session - 09/29/24 1117     Visit Number 2    Number of Visits 13   12 visits plus eval   Date for Recertification  11/05/23    Authorization Type [employer]    OT Start Time 1117    OT Stop Time 1200    OT Time Calculation (min) 43 min    Activity Tolerance Patient tolerated treatment well    Behavior During Therapy WFL for tasks assessed/performed          Past Medical History:  Diagnosis Date   Bone cancer (HCC) 90s   DM (diabetes mellitus) (HCC)    History of bone cancer    HLD (hyperlipidemia)    Hypertension    Leg pain    Muscle strain of shoulder region    OSA (obstructive sleep apnea)    Overweight    Prostate cancer (HCC)    PSA elevation    Shoulder pain    Past Surgical History:  Procedure Laterality Date   APPENDECTOMY     CORONARY PRESSURE/FFR STUDY N/A 04/18/2021   Procedure: INTRAVASCULAR PRESSURE WIRE/FFR STUDY;  Surgeon: Claudene Victory ORN, MD;  Location: [facility];  Service: Cardiovascular;  Laterality: N/A;   FACIAL NERVE SURGERY Right    LEFT HEART CATH AND CORONARY ANGIOGRAPHY N/A 04/18/2021   Procedure: LEFT HEART CATH AND CORONARY ANGIOGRAPHY;  Surgeon: Claudene Victory ORN, MD;  Location: [facility];  Service: Cardiovascular;  Laterality: N/A;   PROSTATE BIOPSY     Patient Active Problem List   Diagnosis Date Noted   Fracture of thumb, right, closed 06/12/2024   Pain in right knee 12/02/2023   Benign prostatic hyperplasia 03/06/2022   Gastroesophageal reflux disease 03/06/2022   History of myocardial infarction 03/06/2022   Overweight 03/06/2022   CAD (coronary artery disease) 04/17/2021   Malignant neoplasm of prostate (HCC) 01/10/2021   Bilateral impacted cerumen  04/28/2020   Hearing loss, sensorineural, asymmetrical 04/28/2020   Tinnitus of right ear 04/28/2020   Diabetes mellitus (HCC) 02/16/2019   Pain in right hand 01/09/2019   Chronic right shoulder pain 03/26/2018   Hearing loss 02/26/2018   Obesity 02/26/2018   Shoulder pain, right 02/26/2018   Tremor 02/26/2018   Vitamin D deficiency 02/26/2018   Excessive daytime sleepiness 09/13/2014   OSA (obstructive sleep apnea) 07/27/2014   Impaired glucose tolerance 05/17/2014   HTN (hypertension) 05/16/2014   Chest pain 05/16/2014   Blepharoedema 06/17/2013   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   Cheek swelling 06/17/2013   Acquired flat foot 05/01/2011   Elevated fasting blood sugar 05/01/2011   Cancer of skeletal system (HCC) 05/01/2011   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   Essential (primary) hypertension 05/01/2011    ONSET DATE: 06/10/24  REFERRING DIAG: nondisplaced proximal phalanx fracture  THERAPY DIAG:  Pain of right thumb  Muscle weakness (generalized)  Other lack of coordination  Stiffness of thumb joint  Nondisplaced fracture of proximal phalanx of left thumb, initial encounter for closed fracture  Rationale for Evaluation and Treatment: Rehabilitation  SUBJECTIVE:   SUBJECTIVE STATEMENT: Pt states he just got a pair of gloves to try at work. Continues to report pain is closer to the tip  of his R thumb.  Pt accompanied by: self  PERTINENT HISTORY: hx of cancer   PRECAUTIONS: None  RED FLAGS: Hs of cancer, estim/US  contraindicated    WEIGHT BEARING RESTRICTIONS: No  PAIN:  Are you having pain? Yes: NPRS scale: 3/10 at rest and 7/10 with use  Pain location: distal tip of R thumb  Pain description: throbbing, used broom/sweeping had to give it up pain up to 9 Aggravating factors: use of R hand  Relieving factors: rest, nonuse - not using medicine  FALLS: Has patient fallen in last 6 months? No  LIVING  ENVIRONMENT: Lives with: lives with friends Lives in: House/apartment Stairs: No Has following equipment at home: None  PLOF: Independent, Independent with basic ADLs, and Independent with transfers  PATIENT GOALS: Pt wants to be able to do daily tasks (working mostly) with reduced pain.   NEXT MD VISIT: unsure, sometime January   OBJECTIVE:  Note: Objective measures were completed at Evaluation unless otherwise noted.  HAND DOMINANCE: Ambidextrous but eats and writes with R hand  ADLs: Overall ADLs: MOD I but reports sometimes leaving buttons undone or working with a zipper until he gets it   FUNCTIONAL OUTCOME MEASURES: Upper Extremity Functional Scale (UEFS): 21/80    26.3%     UPPER EXTREMITY ROM:     Active ROM Right eval Left eval  Shoulder flexion [facility]   Shoulder abduction    Shoulder adduction    Shoulder extension    Shoulder internal rotation    Shoulder external rotation    Elbow flexion WFL    Elbow extension WFL   Wrist flexion 40   Wrist extension 25   Wrist ulnar deviation 20   Wrist radial deviation 15   Wrist pronation    Wrist supination    (Blank rows = not tested)  Active ROM Right eval Left eval  Thumb MCP (0-60) 50   Thumb IP (0-80) 30   Thumb Radial abd/add (0-55) 30    Thumb Palmar abd/add (0-45)     Thumb Opposition to Small Finger WFL    Index MCP (0-90)     Index PIP (0-100)     Index DIP (0-70)      Long MCP (0-90)      Long PIP (0-100)      Long DIP (0-70)      Ring MCP (0-90)      Ring PIP (0-100)      Ring DIP (0-70)      Little MCP (0-90)      Little PIP (0-100)      Little DIP (0-70)      (Blank rows = not tested)   UPPER EXTREMITY MMT:     MMT Right eval Left eval  Shoulder flexion [facility]  Shoulder abduction    Shoulder adduction    Shoulder extension    Shoulder internal rotation    Shoulder external rotation    Middle trapezius    Lower trapezius    Elbow flexion    Elbow extension    Wrist  flexion    Wrist extension    Wrist ulnar deviation    Wrist radial deviation    Wrist pronation    Wrist supination    (Blank rows = not tested)  HAND FUNCTION: Grip strength: Right: 58.8 lbs; Left: 68.3 lbs  COORDINATION: 9 Hole Peg test: Right: 27.21 sec; Left: 32.4 sec  SENSATION: 2 point discrimination test: deficits beginning at level 4  EDEMA: slight,  mild edema in R han d  COGNITION: Overall cognitive status: Within functional limits for tasks assessed Areas of impairment: none  OBSERVATIONS: Pt is a well appearing 73 yo male who sustained an injury to his R hand while working on a trailer on 06/10/24, resulting in nondisplaced proximal phalanx fracture. Nonoperative management recommended. Pt now with no splint but continues to have significant pain in R thumb at distal most end, potentially presenting with nerve pain. Pt would benefit from skilled OT to address R hand/thumb stiffness and pain to improve ability to perform IADLs and functional tasks.    TREATMENT DATE: 10/01/24  - Therapeutic exercises completed for duration as noted below including:  Pt placed RUE in Fluidotherapy machine with supervised AROM x 10 min. Pt was educated to complete modified tendon glide to include specific thumb ROM as well as performing alphabet in sign language during modality time to improve ROM and decrease pain/stiffness and decreased sensitivity of affected extremity by use of the machine's massaging action and thermal properties.   Also focused on restoring hand mobility, dexterity, and thumb ROM by utilizing the manual alphabet from [employer] (ASL) as a functional, graded range-of-motion task.  Patient reports some familiarity with sign language due to family members, which increased engagement and carryover potential. OT instructed patient in forming selected ASL letters that target thumb abduction, opposition, MCP/PIP flexion and extension, and intrinsic muscle  activation. Emphasis placed on slow, controlled movements, maintaining pain-free ranges, and avoiding compensatory wrist motions.  Patient able to perform multiple letters with fair accuracy and required intermittent cues for thumb positioning, isolation of digits, and avoiding excessive stiffness in the healing finger. Activity tolerated well with no increase in pain reported. Education provided on incorporating signing his full name into a daily HEP to improve functional ROM, coordination, and fine motor control while promoting tendon glide and intrinsic muscle activation.   Patient also participated in hand strengthening using a green foam resistance block. Exercises included full-hand grip squeezes for global strengthening, followed by in-hand manipulation tasks to rotate the block using the thumb and fingers. OT provided cues for isolating thumb movement and minimizing compensatory wrist motions. Patient was able to rotate the block within the palm with moderate difficulty but improved fluidity with practice.  Pt able to do some thumb pushes into block with slight increase in discomfort therefore focussed on full grip only at home for now.   PATIENT EDUCATION: Education details: AROM with ASL sign language letters  Person educated: Patient Education method: Explanation, Demonstration, Tactile cues, Verbal cues, and Handouts Education comprehension: verbalized understanding, returned demonstration, verbal cues required, tactile cues required, and needs further education  HOME EXERCISE PROGRAM: 09/29/24: ASL sign language letters for AROM  GOALS: Goals reviewed with patient? Yes  SHORT TERM GOALS: Target date: 10/14/24  Pt will report adhering to joint protection techniques with work tasks with 75% accuracy  Baseline: New to outpt OT Goal status: IN Progress   2.  Pt will be supervision with HEP for R hand ROM/stretches to improve ROM at all appropriate joints. Baseline: deficits mostly  at wrist and thumb MP, IP Goal status: IN Progress  3.  Pt will report improved pain levels to no more than 5/10 with functional use and activity Baseline:  Goal status: IN Progress  4.  In conjunction with the pt, OT will initiate education on AE such as button hook/zipper pull with pt to improve ability to perform fine ADL tasks Baseline:  Goal status:  INITIAL    LONG TERM GOALS: Target date: 11/05/23  Pt will report adhering to joint protection techniques with work tasks with 100% accuracy  Baseline:  Goal status: INITIAL  2.  Pt will be MOD I with HEP for R hand ROM/stretches to improve ROM at all appropriate joints. Baseline:  Goal status: INITIAL  3.  Pt will report improved pain levels to no more than 2/10 with functional use and activity Baseline:  Goal status: INITIAL  4.  Pt will report ability to return to IADL tasks such as cooking a meal with adaptive techniques as needed with improved functional outcomes Baseline:  Goal status: INITIAL    ASSESSMENT:  CLINICAL IMPRESSION: Patient is a 73 y.o. male who was seen today for occupational therapy treatment for nondisplaced proximal phalanx fracture from 8/20. Pt with conservative management and no longer in splint but continues to have lasting pain at distal portion of thumb as well as ROM limitations. Introduced simple ROM with sign language alphabet today and trialled fluidotherapy but had some increased discomfort with increased blood flow to the area.  Pt wil benefit from further skilled OT to address ROM and strength deficits to improve functional outcomes.   PERFORMANCE DEFICITS: in functional skills including ADLs, IADLs, coordination, sensation, ROM, strength, and pain, cognitive skills including none, and psychosocial skills including none.   IMPAIRMENTS: are limiting patient from ADLs, IADLs, work, and leisure.   COMORBIDITIES: may have co-morbidities  that affects occupational performance. Patient will  benefit from skilled OT to address above impairments and improve overall function.  REHAB POTENTIAL: Good  PLAN:  OT FREQUENCY: 2x/week  OT DURATION: 6 weeks  PLANNED INTERVENTIONS: 97168 OT Re-evaluation, 97535 self care/ADL training, 02889 therapeutic exercise, 97530 therapeutic activity, 97112 neuromuscular re-education, 97140 manual therapy, 97035 ultrasound, 97018 paraffin, 02960 fluidotherapy, 97010 moist heat, 97034 contrast bath, passive range of motion, patient/family education, and DME and/or AE instructions  RECOMMENDED OTHER SERVICES: none  CONSULTED AND AGREED WITH PLAN OF CARE: Patient  PLAN FOR NEXT SESSION: fluidotherapy vs moist heat for prep, ROM for R wrist and digits focusing on R thumb MP and IP, maybe densensitization with rice/sticks for R thumb, light strengthening for R hand, joint protectin techniques!!  HEP/handouts - joint protection, thumb ROM/static stretches From EVAL: He works hard and pushes through pain  Clarita LITTIE Pride, OT 09/29/2024, 12:08 PM

## 2024-10-01 ENCOUNTER — Ambulatory Visit

## 2024-10-01 DIAGNOSIS — S62515A Nondisplaced fracture of proximal phalanx of left thumb, initial encounter for closed fracture: Secondary | ICD-10-CM

## 2024-10-01 DIAGNOSIS — M25649 Stiffness of unspecified hand, not elsewhere classified: Secondary | ICD-10-CM

## 2024-10-01 DIAGNOSIS — M6281 Muscle weakness (generalized): Secondary | ICD-10-CM

## 2024-10-01 DIAGNOSIS — M79644 Pain in right finger(s): Secondary | ICD-10-CM

## 2024-10-01 NOTE — Therapy (Signed)
 OUTPATIENT OCCUPATIONAL THERAPY ORTHO TREATMENT  Patient Name: Thomas Bowman MRN: [id number] DOB:[birth year], 73 y.o., male Today's Date: 10/01/2024  PCP: De [place], Quintin PARAS, MD  REFERRING PROVIDER: Emiliano [name] LITTIE, PA   END OF SESSION:  OT End of Session - 10/01/24 1359     Visit Number 3    Number of Visits 13    Date for Recertification  11/05/23    Authorization Type [employer]          Past Medical History:  Diagnosis Date   Bone cancer (HCC) 90s   DM (diabetes mellitus) (HCC)    History of bone cancer    HLD (hyperlipidemia)    Hypertension    Leg pain    Muscle strain of shoulder region    OSA (obstructive sleep apnea)    Overweight    Prostate cancer (HCC)    PSA elevation    Shoulder pain    Past Surgical History:  Procedure Laterality Date   APPENDECTOMY     CORONARY PRESSURE/FFR STUDY N/A 04/18/2021   Procedure: INTRAVASCULAR PRESSURE WIRE/FFR STUDY;  Surgeon: Claudene Victory ORN, MD;  Location: [facility];  Service: Cardiovascular;  Laterality: N/A;   FACIAL NERVE SURGERY Right    LEFT HEART CATH AND CORONARY ANGIOGRAPHY N/A 04/18/2021   Procedure: LEFT HEART CATH AND CORONARY ANGIOGRAPHY;  Surgeon: Claudene Victory ORN, MD;  Location: [facility];  Service: Cardiovascular;  Laterality: N/A;   PROSTATE BIOPSY     Patient Active Problem List   Diagnosis Date Noted   Fracture of thumb, right, closed 06/12/2024   Pain in right knee 12/02/2023   Benign prostatic hyperplasia 03/06/2022   Gastroesophageal reflux disease 03/06/2022   History of myocardial infarction 03/06/2022   Overweight 03/06/2022   CAD (coronary artery disease) 04/17/2021   Malignant neoplasm of prostate (HCC) 01/10/2021   Bilateral impacted cerumen 04/28/2020   Hearing loss, sensorineural, asymmetrical 04/28/2020   Tinnitus of right ear 04/28/2020   Diabetes mellitus (HCC) 02/16/2019   Pain in right hand 01/09/2019   Chronic right shoulder pain 03/26/2018   Hearing  loss 02/26/2018   Obesity 02/26/2018   Shoulder pain, right 02/26/2018   Tremor 02/26/2018   Vitamin D deficiency 02/26/2018   Excessive daytime sleepiness 09/13/2014   OSA (obstructive sleep apnea) 07/27/2014   Impaired glucose tolerance 05/17/2014   HTN (hypertension) 05/16/2014   Chest pain 05/16/2014   Blepharoedema 06/17/2013   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   Cheek swelling 06/17/2013   Acquired flat foot 05/01/2011   Elevated fasting blood sugar 05/01/2011   Cancer of skeletal system (HCC) 05/01/2011   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   Essential (primary) hypertension 05/01/2011    ONSET DATE: 06/10/24  REFERRING DIAG: nondisplaced proximal phalanx fracture  THERAPY DIAG:  Pain of right thumb  Stiffness of thumb joint  Nondisplaced fracture of proximal phalanx of left thumb, initial encounter for closed fracture  Muscle weakness (generalized)  Rationale for Evaluation and Treatment: Rehabilitation  SUBJECTIVE:   SUBJECTIVE STATEMENT: Pt reports he is trying out the gloves, reported that the foam block he was given last tx session has been helping.  Pt accompanied by: self  PERTINENT HISTORY: hx of cancer   PRECAUTIONS: None  RED FLAGS: Hs of cancer, estim/US  contraindicated    WEIGHT BEARING RESTRICTIONS: No  PAIN:  Are you having pain? Yes: NPRS scale: 4/10 at rest and 7/10 with use  Pain location: distal tip  of R thumb  Pain description: throbbing, used broom/sweeping had to give it up pain up to 9 Aggravating factors: use of R hand  Relieving factors: rest, nonuse - not using medicine  FALLS: Has patient fallen in last 6 months? No  LIVING ENVIRONMENT: Lives with: lives with friends Lives in: House/apartment Stairs: No Has following equipment at home: None  PLOF: Independent, Independent with basic ADLs, and Independent with transfers  PATIENT GOALS: Pt wants to be able to do daily  tasks (working mostly) with reduced pain.   NEXT MD VISIT: unsure, sometime January   OBJECTIVE:  Note: Objective measures were completed at Evaluation unless otherwise noted.  HAND DOMINANCE: Ambidextrous but eats and writes with R hand  ADLs: Overall ADLs: MOD I but reports sometimes leaving buttons undone or working with a zipper until he gets it   FUNCTIONAL OUTCOME MEASURES: Upper Extremity Functional Scale (UEFS): 21/80    26.3%     UPPER EXTREMITY ROM:     Active ROM Right eval Left eval  Shoulder flexion [facility]   Shoulder abduction    Shoulder adduction    Shoulder extension    Shoulder internal rotation    Shoulder external rotation    Elbow flexion WFL    Elbow extension WFL   Wrist flexion 40   Wrist extension 25   Wrist ulnar deviation 20   Wrist radial deviation 15   Wrist pronation    Wrist supination    (Blank rows = not tested)  Active ROM Right eval Left eval  Thumb MCP (0-60) 50   Thumb IP (0-80) 30   Thumb Radial abd/add (0-55) 30    Thumb Palmar abd/add (0-45)     Thumb Opposition to Small Finger WFL    Index MCP (0-90)     Index PIP (0-100)     Index DIP (0-70)      Long MCP (0-90)      Long PIP (0-100)      Long DIP (0-70)      Ring MCP (0-90)      Ring PIP (0-100)      Ring DIP (0-70)      Little MCP (0-90)      Little PIP (0-100)      Little DIP (0-70)      (Blank rows = not tested)   UPPER EXTREMITY MMT:     MMT Right eval Left eval  Shoulder flexion [facility]  Shoulder abduction    Shoulder adduction    Shoulder extension    Shoulder internal rotation    Shoulder external rotation    Middle trapezius    Lower trapezius    Elbow flexion    Elbow extension    Wrist flexion    Wrist extension    Wrist ulnar deviation    Wrist radial deviation    Wrist pronation    Wrist supination    (Blank rows = not tested)  HAND FUNCTION: Grip strength: Right: 58.8 lbs; Left: 68.3 lbs  COORDINATION: 9 Hole Peg test: Right:  27.21 sec; Left: 32.4 sec  SENSATION: 2 point discrimination test: deficits beginning at level 4  EDEMA: slight, mild edema in R han d  COGNITION: Overall cognitive status: Within functional limits for tasks assessed Areas of impairment: none  OBSERVATIONS: Pt is a well appearing 73 yo male who sustained an injury to his R hand while working on a trailer on 06/10/24, resulting in nondisplaced proximal phalanx fracture. Nonoperative management recommended. Pt  now with no splint but continues to have significant pain in R thumb at distal most end, potentially presenting with nerve pain. Pt would benefit from skilled OT to address R hand/thumb stiffness and pain to improve ability to perform IADLs and functional tasks.    TREATMENT DATE: 10/01/24  - Therapeutic exercises completed for duration as noted below including:  Pt placed RUE in Fluidotherapy machine with supervised AROM x 10 min. Pt was educated to complete modified tendon glide to include specific thumb ROM as well as performing alphabet in sign language during modality time to improve ROM and decrease pain/stiffness and decreased sensitivity of affected extremity by use of the machine's massaging action and thermal properties.     -Educated pt in HEP for stretching and light strengthening of thumb with use of light resistance rubber band. Min verbal cues were required for proper form. See Pt instructions for HEP. Pt reported at end of session pain was 3/10, an improvement from beginning of session.   Self care training completed for duration below including:  Educated pt in desensitization techniques to reduce sensitivity/pain in R thumb specifically at tip of finger. See Pt instructions for handout provided. Completed submerging of R hand in rice bin retrieving 10 small items.    PATIENT EDUCATION: Education details: desensitization, thumb HEP Person educated: Patient Education method: Explanation, Demonstration, Tactile cues,  Verbal cues, and Handouts Education comprehension: verbalized understanding, returned demonstration, verbal cues required, tactile cues required, and needs further education  HOME EXERCISE PROGRAM: 09/29/24: ASL sign language letters for AROM 10/01/24:  GOALS: Goals reviewed with patient? Yes  SHORT TERM GOALS: Target date: 10/14/24  Pt will report adhering to joint protection techniques with work tasks with 75% accuracy  Baseline: New to outpt OT Goal status: IN Progress   2.  Pt will be supervision with HEP for R hand ROM/stretches to improve ROM at all appropriate joints. Baseline: deficits mostly at wrist and thumb MP, IP Goal status: IN Progress  3.  Pt will report improved pain levels to no more than 5/10 with functional use and activity Baseline:  Goal status: IN Progress  4.  In conjunction with the pt, OT will initiate education on AE such as button hook/zipper pull with pt to improve ability to perform fine ADL tasks Baseline:  Goal status: INITIAL    LONG TERM GOALS: Target date: 11/05/23  Pt will report adhering to joint protection techniques with work tasks with 100% accuracy  Baseline:  Goal status: INITIAL  2.  Pt will be MOD I with HEP for R hand ROM/stretches to improve ROM at all appropriate joints. Baseline:  Goal status: IN PROGRESS  3.  Pt will report improved pain levels to no more than 2/10 with functional use and activity Baseline:  Goal status: IN PROGRESS  4.  Pt will report ability to return to IADL tasks such as cooking a meal with adaptive techniques as needed with improved functional outcomes Baseline:  Goal status: INITIAL    ASSESSMENT:  CLINICAL IMPRESSION: Patient is a 73 y.o. male who was seen today for occupational therapy treatment for nondisplaced proximal phalanx fracture from 8/20. Pt with conservative management and no longer in splint but continues to have lasting pain at distal portion of thumb as well as ROM limitations.  Instructed pt in HEP for thumb ROM and light strengthening, which pt tolerated well, reported slight decrease in pain at end of session as compared to beginning of session.  Pt wil benefit from  further skilled OT to address ROM and strength deficits to improve functional outcomes.   PERFORMANCE DEFICITS: in functional skills including ADLs, IADLs, coordination, sensation, ROM, strength, and pain, cognitive skills including none, and psychosocial skills including none.   IMPAIRMENTS: are limiting patient from ADLs, IADLs, work, and leisure.   COMORBIDITIES: may have co-morbidities  that affects occupational performance. Patient will benefit from skilled OT to address above impairments and improve overall function.  REHAB POTENTIAL: Good  PLAN:  OT FREQUENCY: 2x/week  OT DURATION: 6 weeks  PLANNED INTERVENTIONS: 97168 OT Re-evaluation, 97535 self care/ADL training, 02889 therapeutic exercise, 97530 therapeutic activity, 97112 neuromuscular re-education, 97140 manual therapy, 97035 ultrasound, 97018 paraffin, 02960 fluidotherapy, 97010 moist heat, 97034 contrast bath, passive range of motion, patient/family education, and DME and/or AE instructions  RECOMMENDED OTHER SERVICES: none  CONSULTED AND AGREED WITH PLAN OF CARE: Patient  PLAN FOR NEXT SESSION: fluidotherapy vs moist heat for prep, ROM for R wrist and digits focusing on R thumb MP and IP,  light strengthening for R hand, joint protectin techniques!!  HEP/handouts - joint protection, wrist stretching From EVAL: He works hard and pushes through pain  [employer], OT 10/01/2024, 1:59 PM

## 2024-10-01 NOTE — Patient Instructions (Addendum)
  Desensitization Techniques  One way to desensitize a painful incision or area is by rubbing it with different textures. This will make your limb more tolerant to touch and pressure. Before you begin, make sure your hands and the materials you're using are clean.  To rub your painful/sensitive area with different textures:  . Sit in a comfortable position with the painful/sensitive area uncovered. . Start with a material that is soft, such as a cotton ball, silk or soft towel. Rub your painful/sensitive area in all directions. Start with a light pressure and gradually increase the pressure. . Vary the textures you use as you are able to tolerate them. Start with soft materials like cotton balls or a makeup brush. Progress to materials that are rougher. Examples include a paper towel, cloth towel, wool, or velcro. As you progress, gradually increase the pressure and roughness of the texture you use. Marland Kitchen Be careful not to rub over your incision if you still have staples or sutures in place, or if there are any open areas. . Rub your limb for at least 30 seconds progressing up to a minute or two as often as you can tolerate, or as recommended by your healthcare provider.  Other methods for desensitization include:  Marland Kitchen Dipping your affected area into a bowl of dry rice, sand, kidney beans, cold water or warm water. . Allow cool or warm water to run over the area. . Using a TENS unit - make sure to ask your physician or physical therapist prior to using one. If you choose to start with the method of dipping your affected area into a medium such as rice, sand, or water make sure to move the affected area around in the bowl until you cannot tolerate it or you reach one to two minutes, whichever comes first. Use a combination of these methods for 10 to 15 minutes, 3-4 times per day.  The goal of desensitization is to inhibit or interrupt the body's interpretation of routine stimuli as painful. It does not  assure that these stimuli will become pleasant or enjoyable, but that they will no longer provoke an extreme pain response.

## 2024-10-06 ENCOUNTER — Ambulatory Visit

## 2024-10-08 ENCOUNTER — Ambulatory Visit (HOSPITAL_BASED_OUTPATIENT_CLINIC_OR_DEPARTMENT_OTHER): Admitting: Family Medicine

## 2024-10-09 ENCOUNTER — Ambulatory Visit: Admitting: Occupational Therapy

## 2024-10-12 ENCOUNTER — Telehealth: Payer: Self-pay

## 2024-10-12 ENCOUNTER — Ambulatory Visit

## 2024-10-12 NOTE — Telephone Encounter (Signed)
 Pt no-showed for OT appointment today. Therefore, OT called pt's listed phone number [phone]). Pt answered phone, reported he had forgotten appt. Informed pt of no-show policy, but pt agreed to attend next appt schedule for 10/19/24 at 2:45 PM.   Audrena Talaga, OTR/L

## 2024-10-13 ENCOUNTER — Ambulatory Visit (INDEPENDENT_AMBULATORY_CARE_PROVIDER_SITE_OTHER): Admitting: Family Medicine

## 2024-10-13 ENCOUNTER — Encounter (HOSPITAL_BASED_OUTPATIENT_CLINIC_OR_DEPARTMENT_OTHER): Payer: Self-pay | Admitting: Family Medicine

## 2024-10-13 VITALS — BP 127/72 | HR 81 | Ht 69.0 in | Wt 169.5 lb

## 2024-10-13 DIAGNOSIS — E1165 Type 2 diabetes mellitus with hyperglycemia: Secondary | ICD-10-CM

## 2024-10-13 DIAGNOSIS — I10 Essential (primary) hypertension: Secondary | ICD-10-CM

## 2024-10-13 NOTE — Patient Instructions (Addendum)
" °  Medication Instructions:  Your physician recommends that you continue on your current medications as directed. Please refer to the Current Medication list given to you today. --If you need a refill on any your medications before your next appointment, please call your pharmacy first. If no refills are authorized on file call the office.-- Lab Work: Your physician has recommended that you have lab work today: no If you have labs (blood work) drawn today and your tests are completely normal, you will receive your results via MyChart message OR a phone call from our staff.  Please ensure you check your voicemail in the event that you authorized detailed messages to be left on a delegated number. If you have any lab test that is abnormal or we need to change your treatment, we will call you to review the results.  Referrals/Procedures/Imaging: no  Follow-Up: Your next appointment:   Your physician recommends that you schedule a follow-up appointment in: nurse visit 10/19/24 for Ozempic  injection teaching and 1 month follow up with Dr. de Cuba  You will receive a text message or e-mail with a link to a survey about your care and experience with us  today! We would greatly appreciate your feedback!   Thanks for letting us  be apart of your health journey!!  Primary Care and Sports Medicine   Dr. Quintin sheerer Cuba   We encourage you to activate your patient portal called MyChart.  Sign up information is provided on this After Visit Summary.  MyChart is used to connect with patients for Virtual Visits (Telemedicine).  Patients are able to view lab/test results, encounter notes, upcoming appointments, etc.  Non-urgent messages can be sent to your provider as well. To learn more about what you can do with MyChart, please visit --  [email or web address].    "

## 2024-10-13 NOTE — Assessment & Plan Note (Addendum)
 At last visit, we did look to start Ozempic .  Patient reports that he has received medication, however [employer] had delivered medication to the wrong address and thus only recently received medication.  He has not been able to start medication yet.  He indicates that he would like assistance with first injection, but plans to typically administer this likely on Sundays or Mondays. Patient will return for nurse visit to assist with first injection of Ozempic . Complete foot exam at future office visit Plan to follow-up in about 1 month to assess progress with Ozempic  and consider titrating dose of medication.

## 2024-10-13 NOTE — Progress Notes (Signed)
" ° ° °  Procedures performed today:    None.  Independent interpretation of notes and tests performed by another provider:   None.  Brief History, Exam, Impression, and Recommendations:    BP 127/72   Pulse 81   Ht 5' 9 (1.753 m)   Wt 169 lb 8 oz (76.9 kg)   SpO2 99%   BMI 25.03 kg/m   Essential (primary) hypertension Assessment & Plan: Blood pressure appropriate in office today.  He continues with medications as prescribed.  No current issues with chest pain or headaches. Given current blood pressure, can continue with current medication regimen, no changes made today.  Recommend intermittent monitoring of blood pressure at home, DASH diet   Type 2 diabetes mellitus with hyperglycemia, without long-term current use of insulin (HCC) Assessment & Plan: At last visit, we did look to start Ozempic .  Patient reports that he has received medication, however mail order pharmacy Patient indicates that he has received medication, however [employer] had delivered medication to the wrong address and thus only recently received medication.  He has not been able to start medication yet.  He indicates that he would like assistance with first injection, but plans to typically administer this likely on Sundays or Mondays. Patient will return for nurse visit to assist with first injection of Ozempic . Complete foot exam at future office visit Plan to follow-up in about 1 month to assess progress with Ozempic  and consider titrating dose of medication.   Return in about 1 month (around 11/13/2024) for diabetes, hypertension.   ___________________________________________ Erikka Follmer de [place], MD, ABFM, CAQSM Primary Care and Sports Medicine [facility] "

## 2024-10-16 ENCOUNTER — Ambulatory Visit

## 2024-10-19 ENCOUNTER — Ambulatory Visit

## 2024-10-19 ENCOUNTER — Ambulatory Visit (INDEPENDENT_AMBULATORY_CARE_PROVIDER_SITE_OTHER): Admitting: Family Medicine

## 2024-10-19 DIAGNOSIS — S62515A Nondisplaced fracture of proximal phalanx of left thumb, initial encounter for closed fracture: Secondary | ICD-10-CM

## 2024-10-19 DIAGNOSIS — M79644 Pain in right finger(s): Secondary | ICD-10-CM

## 2024-10-19 DIAGNOSIS — M25649 Stiffness of unspecified hand, not elsewhere classified: Secondary | ICD-10-CM

## 2024-10-19 DIAGNOSIS — M6281 Muscle weakness (generalized): Secondary | ICD-10-CM

## 2024-10-19 DIAGNOSIS — R208 Other disturbances of skin sensation: Secondary | ICD-10-CM

## 2024-10-19 DIAGNOSIS — E1165 Type 2 diabetes mellitus with hyperglycemia: Secondary | ICD-10-CM | POA: Diagnosis not present

## 2024-10-19 DIAGNOSIS — R278 Other lack of coordination: Secondary | ICD-10-CM

## 2024-10-19 NOTE — Therapy (Signed)
 " OUTPATIENT OCCUPATIONAL THERAPY ORTHO TREATMENT  Patient Name: Thomas Bowman MRN: [id number] DOB:09-Jul-1951, 73 y.o., male Today's Date: 10/19/2024  PCP: [place], Quintin PARAS, MD  REFERRING PROVIDER: Emiliano [name] LITTIE, PA   END OF SESSION:  OT End of Session - 10/19/24 1449     Visit Number 4    Number of Visits 13    Date for Recertification  11/05/23    Authorization Type [employer]    OT Start Time 1445    OT Stop Time 1538    OT Time Calculation (min) 53 min    Equipment Utilized During Treatment coban, fluido, zipper pull and button hook    Activity Tolerance Patient tolerated treatment well    Behavior During Therapy WFL for tasks assessed/performed          Past Medical History:  Diagnosis Date   Bone cancer (HCC) 90s   DM (diabetes mellitus) (HCC)    History of bone cancer    HLD (hyperlipidemia)    Hypertension    Leg pain    Muscle strain of shoulder region    OSA (obstructive sleep apnea)    Overweight    Prostate cancer (HCC)    PSA elevation    Shoulder pain    Past Surgical History:  Procedure Laterality Date   APPENDECTOMY     CORONARY PRESSURE/FFR STUDY N/A 04/18/2021   Procedure: INTRAVASCULAR PRESSURE WIRE/FFR STUDY;  Surgeon: Claudene Victory ORN, MD;  Location: [facility];  Service: Cardiovascular;  Laterality: N/A;   FACIAL NERVE SURGERY Right    LEFT HEART CATH AND CORONARY ANGIOGRAPHY N/A 04/18/2021   Procedure: LEFT HEART CATH AND CORONARY ANGIOGRAPHY;  Surgeon: Claudene Victory ORN, MD;  Location: [facility];  Service: Cardiovascular;  Laterality: N/A;   PROSTATE BIOPSY     Patient Active Problem List   Diagnosis Date Noted   Fracture of thumb, right, closed 06/12/2024   Pain in right knee 12/02/2023   Benign prostatic hyperplasia 03/06/2022   Gastroesophageal reflux disease 03/06/2022   History of myocardial infarction 03/06/2022   Overweight 03/06/2022   CAD (coronary artery disease) 04/17/2021   Malignant neoplasm of  prostate (HCC) 01/10/2021   Bilateral impacted cerumen 04/28/2020   Hearing loss, sensorineural, asymmetrical 04/28/2020   Tinnitus of right ear 04/28/2020   Diabetes mellitus (HCC) 02/16/2019   Pain in right hand 01/09/2019   Chronic right shoulder pain 03/26/2018   Hearing loss 02/26/2018   Obesity 02/26/2018   Shoulder pain, right 02/26/2018   Tremor 02/26/2018   Vitamin D deficiency 02/26/2018   Excessive daytime sleepiness 09/13/2014   OSA (obstructive sleep apnea) 07/27/2014   Impaired glucose tolerance 05/17/2014   HTN (hypertension) 05/16/2014   Chest pain 05/16/2014   Blepharoedema 06/17/2013   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   Cheek swelling 06/17/2013   Acquired flat foot 05/01/2011   Elevated fasting blood sugar 05/01/2011   Cancer of skeletal system (HCC) 05/01/2011   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   Essential (primary) hypertension 05/01/2011    ONSET DATE: 06/10/24  REFERRING DIAG: nondisplaced proximal phalanx fracture  THERAPY DIAG:  Pain of right thumb  Stiffness of thumb joint  Nondisplaced fracture of proximal phalanx of left thumb, initial encounter for closed fracture  Muscle weakness (generalized)  Other lack of coordination  Other disturbances of skin sensation  Rationale for Evaluation and Treatment: Rehabilitation  SUBJECTIVE:   SUBJECTIVE STATEMENT: Pt reports increased pain this date,  states it's like a nerve or something is pinching right at the tip of my thumb. It's sharp and it hurts.   Pt accompanied by: self  PERTINENT HISTORY: hx of cancer   PRECAUTIONS: None  RED FLAGS: Hs of cancer, estim/US  contraindicated    WEIGHT BEARING RESTRICTIONS: No  PAIN:  Are you having pain? Yes: NPRS scale: 4/10 at rest ad up to 8-10/10 with use  Pain location: distal tip of R thumb  Pain description: throbbing, sharp used broom/sweeping had to give it up pain up to 9. Aggravating  factors: use of R hand  Relieving factors: rest, nonuse - not using medicine  FALLS: Has patient fallen in last 6 months? No  LIVING ENVIRONMENT: Lives with: lives with friends Lives in: House/apartment Stairs: No Has following equipment at home: None  PLOF: Independent, Independent with basic ADLs, and Independent with transfers  PATIENT GOALS: Pt wants to be able to do daily tasks (working mostly) with reduced pain.   NEXT MD VISIT: unsure, sometime January   OBJECTIVE:  Note: Objective measures were completed at Evaluation unless otherwise noted.  HAND DOMINANCE: Ambidextrous but eats and writes with R hand  ADLs: Overall ADLs: MOD I but reports sometimes leaving buttons undone or working with a zipper until he gets it   FUNCTIONAL OUTCOME MEASURES: Upper Extremity Functional Scale (UEFS): 21/80    26.3%     UPPER EXTREMITY ROM:     Active ROM Right eval Left eval  Shoulder flexion [facility]   Shoulder abduction    Shoulder adduction    Shoulder extension    Shoulder internal rotation    Shoulder external rotation    Elbow flexion WFL    Elbow extension WFL   Wrist flexion 40   Wrist extension 25   Wrist ulnar deviation 20   Wrist radial deviation 15   Wrist pronation    Wrist supination    (Blank rows = not tested)  Active ROM Right eval Left eval  Thumb MCP (0-60) 50   Thumb IP (0-80) 30   Thumb Radial abd/add (0-55) 30    Thumb Palmar abd/add (0-45)     Thumb Opposition to Small Finger WFL    Index MCP (0-90)     Index PIP (0-100)     Index DIP (0-70)      Long MCP (0-90)      Long PIP (0-100)      Long DIP (0-70)      Ring MCP (0-90)      Ring PIP (0-100)      Ring DIP (0-70)      Little MCP (0-90)      Little PIP (0-100)      Little DIP (0-70)      (Blank rows = not tested)   UPPER EXTREMITY MMT:     MMT Right eval Left eval  Shoulder flexion [facility]  Shoulder abduction    Shoulder adduction    Shoulder extension    Shoulder  internal rotation    Shoulder external rotation    Middle trapezius    Lower trapezius    Elbow flexion    Elbow extension    Wrist flexion    Wrist extension    Wrist ulnar deviation    Wrist radial deviation    Wrist pronation    Wrist supination    (Blank rows = not tested)  HAND FUNCTION: Grip strength: Right: 58.8 lbs; Left: 68.3 lbs  COORDINATION: 9 Hole Peg  test: Right: 27.21 sec; Left: 32.4 sec  SENSATION: 2 point discrimination test: deficits beginning at level 4  EDEMA: slight, mild edema in R han d  COGNITION: Overall cognitive status: Within functional limits for tasks assessed Areas of impairment: none  OBSERVATIONS: Pt is a well appearing 73 yo male who sustained an injury to his R hand while working on a trailer on 06/10/24, resulting in nondisplaced proximal phalanx fracture. Nonoperative management recommended. Pt now with no splint but continues to have significant pain in R thumb at distal most end, potentially presenting with nerve pain. Pt would benefit from skilled OT to address R hand/thumb stiffness and pain to improve ability to perform IADLs and functional tasks.    TREATMENT DATE: 10/19/24  - Therapeutic exercises completed for duration as noted below including:  Pt placed RUE in Fluidotherapy machine with supervised AROM x 8 min. Pt was educated to complete modified tendon glide to include specific thumb ROM as well as performing alphabet in sign language during modality time to improve ROM and decrease pain/stiffness and decreased sensitivity of affected extremity by use of the machine's massaging action and thermal properties.     Reviewed passive and active thumb IP/MCP flexion, educated in importance of completing stretches and not just strengthening for improved flexibility.  - Self-care/home management completed for duration as noted below including:  Educated in button hook and zipper pull for improved ability to participate in ADLs.  Provided visual demo nad vebral instruction, pt returned demo and stated I could make one of these at home.  Educated in bandaging for protection of R thumb tip, applied Coban at 25% pull and band-aid for R thumb tip. Pt reported that it felt alright when picking things up.     PATIENT EDUCATION: Education details: desensitization, thumb HEP Person educated: Patient Education method: Explanation, Demonstration, Tactile cues, Verbal cues, and Handouts Education comprehension: verbalized understanding, returned demonstration, verbal cues required, tactile cues required, and needs further education  HOME EXERCISE PROGRAM: 09/29/24: ASL sign language letters for AROM 10/01/24: rubber band and PROM/AROM GOALS: Goals reviewed with patient? Yes  SHORT TERM GOALS: Target date: 10/14/24  Pt will report adhering to joint protection techniques with work tasks with 75% accuracy  Baseline: New to outpt OT Goal status: IN Progress   2.  Pt will be supervision with HEP for R hand ROM/stretches to improve ROM at all appropriate joints. Baseline: deficits mostly at wrist and thumb MP, IP Goal status: IN Progress  3.  Pt will report improved pain levels to no more than 5/10 with functional use and activity Baseline:  10/19/24: Pt reports some days it's 8/10 in L thumb Goal status: IN Progress  4.  In conjunction with the pt, OT will initiate education on AE such as button hook/zipper pull with pt to improve ability to perform fine ADL tasks Baseline:  Goal status: met    LONG TERM GOALS: Target date: 11/05/23  Pt will report adhering to joint protection techniques with work tasks with 100% accuracy  Baseline:  Goal status: INITIAL  2.  Pt will be MOD I with HEP for R hand ROM/stretches to improve ROM at all appropriate joints. Baseline:  Goal status: IN PROGRESS  3.  Pt will report improved pain levels to no more than 2/10 with functional use and activity Baseline:  10/19/24: Pt  reports pain gets up to 8/10 with functional use and activity Goal status: IN PROGRESS  4.  Pt will report ability to return  to IADL tasks such as cooking a meal with adaptive techniques as needed with improved functional outcomes Baseline:  Goal status: INITIAL    ASSESSMENT:  CLINICAL IMPRESSION: Patient is a 73 y.o. male who was seen today for occupational therapy treatment for nondisplaced proximal phalanx fracture from 8/20. Pt with conservative management and no longer in splint but continues to have lasting pain at distal portion of thumb as well as ROM limitations. Pt completing chiefly strengthening, reporting increased pain and an unpleasant feeling of nerve pain at R thumb tip. Pt would benefit from continued skilled services to provide re-cert or d/c prn and continue improving ability to complete ADL and work tasks with reduced pain.   PERFORMANCE DEFICITS: in functional skills including ADLs, IADLs, coordination, sensation, ROM, strength, and pain, cognitive skills including none, and psychosocial skills including none.   IMPAIRMENTS: are limiting patient from ADLs, IADLs, work, and leisure.   COMORBIDITIES: may have co-morbidities  that affects occupational performance. Patient will benefit from skilled OT to address above impairments and improve overall function.  REHAB POTENTIAL: Good  PLAN:  OT FREQUENCY: 2x/week  OT DURATION: 6 weeks  PLANNED INTERVENTIONS: 97168 OT Re-evaluation, 97535 self care/ADL training, 02889 therapeutic exercise, 97530 therapeutic activity, 97112 neuromuscular re-education, 97140 manual therapy, 97035 ultrasound, 97018 paraffin, 02960 fluidotherapy, 97010 moist heat, 97034 contrast bath, passive range of motion, patient/family education, and DME and/or AE instructions  RECOMMENDED OTHER SERVICES: none  CONSULTED AND AGREED WITH PLAN OF CARE: Patient  PLAN FOR NEXT SESSION: RE-CERT OR D/C   fluidotherapy vs moist heat for prep, ROM for R  wrist and digits focusing on R thumb MP and IP,  light strengthening for R hand, joint protectin techniques!!  HEP/handouts - joint protection, wrist stretching F/u coban and band-aid wrapping of fingertip for protection From EVAL: He works hard and pushes through pain  [employer], OT 10/19/2024, 4:34 PM   "

## 2024-10-19 NOTE — Progress Notes (Signed)
 Pt came in today for teaching on how to administer his Ozempic . Teaching shown to pt and injection was administered to pt. Pt verbalized understanding with teaching and when shown how to make sure he has the correct dosage of medication showing on pen. Pt made aware that he needs to do it once a week and told pt that he should administer same day each week. Pt also shown how to properly place needle on the pen.

## 2024-10-21 ENCOUNTER — Ambulatory Visit: Admitting: Occupational Therapy

## 2024-10-23 NOTE — Assessment & Plan Note (Signed)
 Blood pressure appropriate in office today.  He continues with medications as prescribed.  No current issues with chest pain or headaches. Given current blood pressure, can continue with current medication regimen, no changes made today.  Recommend intermittent monitoring of blood pressure at home, DASH diet

## 2024-10-27 DIAGNOSIS — E1165 Type 2 diabetes mellitus with hyperglycemia: Secondary | ICD-10-CM

## 2024-10-27 MED ORDER — OZEMPIC (0.25 OR 0.5 MG/DOSE) 2 MG/3ML ~~LOC~~ SOPN: 0.2500 mg | PEN_INJECTOR | SUBCUTANEOUS | 1 refills | Status: DC

## 2024-10-28 ENCOUNTER — Encounter (HOSPITAL_BASED_OUTPATIENT_CLINIC_OR_DEPARTMENT_OTHER): Payer: Self-pay | Admitting: Emergency Medicine

## 2024-10-28 ENCOUNTER — Emergency Department (HOSPITAL_BASED_OUTPATIENT_CLINIC_OR_DEPARTMENT_OTHER)
Admission: EM | Admit: 2024-10-28 | Discharge: 2024-10-28 | Disposition: A | Discharge status: Home / Self Care | Attending: Emergency Medicine | Admitting: Emergency Medicine

## 2024-10-28 ENCOUNTER — Ambulatory Visit (INDEPENDENT_AMBULATORY_CARE_PROVIDER_SITE_OTHER)

## 2024-10-28 ENCOUNTER — Encounter (HOSPITAL_BASED_OUTPATIENT_CLINIC_OR_DEPARTMENT_OTHER): Payer: Self-pay

## 2024-10-28 ENCOUNTER — Other Ambulatory Visit: Payer: Self-pay

## 2024-10-28 VITALS — BP 176/105 | HR 54 | Ht 69.0 in | Wt 166.8 lb

## 2024-10-28 DIAGNOSIS — H0289 Other specified disorders of eyelid: Secondary | ICD-10-CM

## 2024-10-28 DIAGNOSIS — H16133 Photokeratitis, bilateral: Secondary | ICD-10-CM | POA: Insufficient documentation

## 2024-10-28 DIAGNOSIS — Z79899 Other long term (current) drug therapy: Secondary | ICD-10-CM | POA: Diagnosis not present

## 2024-10-28 DIAGNOSIS — E119 Type 2 diabetes mellitus without complications: Secondary | ICD-10-CM | POA: Diagnosis not present

## 2024-10-28 DIAGNOSIS — Z7982 Long term (current) use of aspirin: Secondary | ICD-10-CM | POA: Insufficient documentation

## 2024-10-28 DIAGNOSIS — H16139 Photokeratitis, unspecified eye: Secondary | ICD-10-CM

## 2024-10-28 DIAGNOSIS — I10 Essential (primary) hypertension: Secondary | ICD-10-CM | POA: Insufficient documentation

## 2024-10-28 DIAGNOSIS — H02843 Edema of right eye, unspecified eyelid: Secondary | ICD-10-CM | POA: Diagnosis not present

## 2024-10-28 DIAGNOSIS — H5711 Ocular pain, right eye: Secondary | ICD-10-CM | POA: Diagnosis present

## 2024-10-28 MED ORDER — TETRACAINE HCL 0.5 % OP SOLN
1.0000 [drp] | Freq: Once | OPHTHALMIC | Status: DC
  Filled 2024-10-28: qty 4

## 2024-10-28 MED ORDER — OXYCODONE-ACETAMINOPHEN 5-325 MG PO TABS: 1.0000 | ORAL_TABLET | Freq: Three times a day (TID) | ORAL | 0 refills | Status: AC | PRN

## 2024-10-28 MED ORDER — FLUORESCEIN SODIUM 1 MG OP STRP
1.0000 | ORAL_STRIP | Freq: Once | OPHTHALMIC | Status: DC
  Filled 2024-10-28: qty 1

## 2024-10-28 MED ORDER — ARTIFICIAL TEARS OPHTHALMIC OINT: TOPICAL_OINTMENT | OPHTHALMIC | 0 refills | Status: AC | PRN

## 2024-10-28 MED ORDER — OXYCODONE-ACETAMINOPHEN 5-325 MG PO TABS: 1.0000 | ORAL_TABLET | Freq: Once | ORAL | Status: DC

## 2024-10-28 MED ORDER — ERYTHROMYCIN 5 MG/GM OP OINT: TOPICAL_OINTMENT | OPHTHALMIC | 0 refills | Status: AC

## 2024-10-28 NOTE — ED Provider Notes (Signed)
 " [place] EMERGENCY DEPARTMENT AT [facility] Provider Note   CSN: [id number] Arrival date & time: 10/28/24  1013     Patient presents with: Eye Problem   Thomas Bowman is a 74 y.o. 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 The patient describes the symptoms as sharp, almost gritty feeling in the right eye that is constant and does not change upon movement. Associated symptoms include eye tearing. The patient reports no visual changes/disturbances. Patient denies glasses or contacts. Patient states that he has never had anything like this happen before. The patient is in no acute distress.     Eye Problem      Prior to Admission medications  Medication Sig Start Date End Date Taking? Authorizing Provider  artificial tears (LACRILUBE) OINT ophthalmic ointment Place into both eyes every 4 (four) hours as needed for dry eyes. 10/28/24  Yes Sharlisa Hollifield L, PA  erythromycin  ophthalmic ointment Place a 1/2 inch ribbon of ointment into the lower eyelid. 10/28/24  Yes Kynley Metzger L, PA  oxyCODONE -acetaminophen  (PERCOCET/ROXICET) 5-325 MG tablet Take 1 tablet by mouth every 8 (eight) hours as needed for up to 3 days for severe pain (pain score 7-10). 10/28/24 10/31/24 Yes Allea Kassner L, PA  amLODipine  (NORVASC ) 10 MG tablet Take 10 mg by mouth in the morning. 11/02/19   [provider]  ammonium lactate  (AMLACTIN DAILY) 12 % lotion Apply 1 Application topically as needed for dry skin. 02/13/23   Tobie Franky SQUIBB, DPM  ammonium lactate  (LAC-HYDRIN ) 12 % lotion Apply 1 Application  topically as needed for dry skin. 11/07/22   Gaynel Delon LITTIE, DPM  aspirin  EC 81 MG tablet Take 81 mg by mouth 2 (two) times a week.    [provider]  ciclopirox  (LOPROX ) 0.77 % cream Apply to both feet and between toes bid x 6 weeks. 04/13/22   Gaynel Delon LITTIE, DPM  cyclobenzaprine (FLEXERIL) 10 MG tablet Take 10 mg by mouth 2 (two) times daily as needed for muscle spasms. 12/20/21   [provider]  diclofenac  sodium (VOLTAREN ) 1 % GEL Apply 4 g topically 4 (four) times daily. 06/12/18   Onita Duos, MD  diclofenac  Sodium (VOLTAREN ) 1 % GEL Apply 2 g topically 4 (four) times daily. 09/03/22   Branham, Victoria C, MD  finasteride (PROSCAR) 5 MG tablet Take 5 mg by mouth at bedtime. 04/11/21   [provider]  glucose blood (TRUE METRIX PRO BLOOD GLUCOSE) test strip 1 each by Other route as needed for other. Use as instructed    [provider]  isosorbide mononitrate (IMDUR) 30 MG 24 hr tablet Take 30 mg by mouth every morning. 08/15/22   [provider]  Lancets Thin MISC by Does not apply route. 23 gauge    [provider]  lisinopril  (PRINIVIL ,ZESTRIL ) 40 MG tablet TAKE ONE TABLET BY MOUTH ONCE DAILY 08/22/16   Parthenia Olivia HERO, PA-C  metoprolol  succinate (TOPROL  XL) 25 MG 24 hr tablet Take 1 tablet (25 mg total) by mouth daily. 08/16/21   Shlomo Corning  R, MD  Semaglutide ,0.25 or 0.5MG /DOS, (OZEMPIC , 0.25 OR 0.5 MG/DOSE,) 2 MG/3ML SOPN Inject 0.25 mg into the skin once a week. 10/27/24   de [place], Raymond J, MD    Allergies: Hydralazine  hcl and Sulfa antibiotics    Review of Systems  Eyes:  Positive for pain.    Updated Vital Signs BP (!) 185/86 (BP Location: Right Arm)   Pulse (!) 53   Temp (!) 97.5 F (36.4 C) (Oral)   Resp 16   SpO2 100%   Physical Exam Vitals and nursing note reviewed.  Constitutional:      General: He is not in acute distress.    Appearance: Normal appearance.  HENT:     Head: Normocephalic and  atraumatic.  Eyes:     General: Lids are everted, no foreign bodies appreciated. Vision grossly intact.        Right eye: No foreign body or discharge.        Left eye: No foreign body or discharge.     Extraocular Movements: Extraocular movements intact.     Right eye: Normal extraocular motion.     Left eye: Normal extraocular motion.     Conjunctiva/sclera: Conjunctivae normal.     Right eye: Right conjunctiva is not injected. Chemosis present. No exudate or hemorrhage.    Left eye: Left conjunctiva is not injected. No chemosis, exudate or hemorrhage.    Pupils: Pupils are equal, round, and reactive to light.     Comments: Right eye: foreign body sensation, lacrimation to right eye. No photophobia. Mild edema to upper lid, and superficial punctate keratitis.  Left eye: foreign body sensation. No photophobia. Mild superficial punctate keratitis.    Cardiovascular:     Rate and Rhythm: Normal rate and regular rhythm.     Pulses: Normal pulses.  Pulmonary:     Effort: Pulmonary effort is normal. No respiratory distress.     Comments: Patient has no difficulty speaking in complete sentences. Musculoskeletal:        General: Normal range of motion.     Cervical back: Normal range of motion.  Skin:    General: Skin is warm and dry.     Capillary Refill: Capillary refill takes less than 2 seconds.  Neurological:     General: No focal deficit present.     Mental Status: He is alert. Mental status is at baseline.  Psychiatric:        Mood and Affect: Mood normal.     (all labs ordered are listed, but only abnormal results are displayed) Labs Reviewed - No data to display  EKG: None  Radiology: No results found.   Procedures   Medications Ordered in the ED  fluorescein  ophthalmic strip 1 strip (has no administration in time range)  tetracaine  (PONTOCAINE) 0.5 % ophthalmic solution 1-2 drop (has no administration in time range)  oxyCODONE -acetaminophen  (PERCOCET/ROXICET)  5-325 MG per tablet 1 tablet (has no administration in time range)                                 Medical Decision Making Risk Prescription drug management.   Patient presents to the ED for: Eye pain This involves an extensive number of treatment options Differential diagnosis includes: Ultraviolet keratitis  Corneal abrasion   Foreign body  Other ocular etiology   Co-morbid conditions: Diabetes, hypertension  Clinical Course as of 10/28/24 1350  Wed Oct 28, 2024  1207 Temp(!):  97.5 F (36.4 C) Afebrile, vitals stable, [ML]    Clinical Course User Index [ML] Willma Bouchard L, PA    Management / Treatments: See ED course above for medications, treatments administered, and clinical rationale.   Reevaluation of the patient after these medicines showed that the patient improved.  I have reviewed the patients home medicines and have made adjustments as needed  Test Considered/Diagnostic tools:  Additional diagnostic testing such as imaging was considered however based on the patients presenting symptoms, risk factors, and initial clinical assessment deemed not necessary at this time.   ED Course / Reassessments: Problem List: Eye pain 74 year old male presented for eye pain. Initial assessment included history, physical exam, and review of prior medical records.  On fluorescein  stain superficial punctate keratitis was visualized likely due to ultraviolet keratitis given his recent exposure to welding without an eye shield.  Patient is otherwise well-appearing without evidence of retained foreign body, corneal abrasion, ulcer, globe rupture, or pre or orbital/orbital cellulitis.  Visual acuity was within normal limits and patient did not complain of any sort of visual disturbances which was overall reassuring.  Vital signs were obtained and monitored, and the patient remained stable throughout the stay.  Patient was prescribed antibiotic drops, artifical tears, short course of pain  medication, and instructed to follow-up closely with PCP within 3 days for reevaluation.  Disposition: Disposition: Discharge with close follow-up with PCP within 3 days and ophthalmology as needed  Rationale for disposition: stable for discharge  The disposition plan and rationale were discussed with the patient at the bedside, all questions were addressed, and the patient demonstrated understanding.  This note was produced using [employer]. While I have reviewed and verified all clinical information, transcription errors may remain.      Final diagnoses:  Photokeratitis, unspecified laterality    ED Discharge Orders          Ordered    oxyCODONE -acetaminophen  (PERCOCET/ROXICET) 5-325 MG tablet  Every 8 hours PRN        10/28/24 1343    artificial tears (LACRILUBE) OINT ophthalmic ointment  Every 4 hours PRN       Note to Pharmacy: Ultraviolet keratitis   10/28/24 1343    erythromycin  ophthalmic ointment       Note to Pharmacy: Ultraviolet keratitis   10/28/24 1343               Willma Bouchard CROME, [place] 10/28/24 1350    Jerrol Agent, MD 10/28/24 1405  "

## 2024-10-28 NOTE — ED Triage Notes (Signed)
 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

## 2024-10-28 NOTE — Progress Notes (Signed)
 "     Acute Care Office Visit  Subjective:   Thomas Bowman 05-24-1951 10/28/2024  Chief Complaint  Patient presents with   Eye Problem    Patient states he has been having eye issues since yesterday. 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     HPI: Presents today for right eye swelling. Patient is a psychologist, occupational and states he was not wearing a shield and believes he got something in his eye. Patient reports blurry vision with swelling and drainage. States nothing has made the pain better.    The following portions of the patient's history were reviewed and updated as appropriate: past medical history, past surgical history, family history, social history, allergies, medications, and problem list.   Patient Active Problem List   Diagnosis Date Noted   Fracture of thumb, right, closed 06/12/2024   Pain in right knee 12/02/2023   Benign prostatic hyperplasia 03/06/2022   Gastroesophageal reflux disease 03/06/2022   History of myocardial infarction 03/06/2022   Overweight 03/06/2022   CAD (coronary artery disease) 04/17/2021   Malignant neoplasm of prostate (HCC) 01/10/2021   Bilateral impacted cerumen 04/28/2020   Hearing loss, sensorineural, asymmetrical 04/28/2020   Tinnitus of right ear 04/28/2020   Diabetes mellitus (HCC) 02/16/2019   Pain in right hand 01/09/2019   Chronic right shoulder pain 03/26/2018   Hearing loss 02/26/2018   Obesity 02/26/2018   Shoulder pain, right 02/26/2018   Tremor 02/26/2018   Vitamin D deficiency 02/26/2018   Excessive daytime sleepiness 09/13/2014   OSA (obstructive sleep apnea) 07/27/2014   Impaired glucose tolerance 05/17/2014   HTN (hypertension) 05/16/2014   Chest pain 05/16/2014   Blepharoedema 06/17/2013   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   Cheek swelling 06/17/2013   Acquired flat foot 05/01/2011   Elevated fasting blood sugar 05/01/2011   Cancer of skeletal system (HCC) 05/01/2011    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   Essential (primary) hypertension 05/01/2011   Past Medical History:  Diagnosis Date   Bone cancer (HCC) 90s   DM (diabetes mellitus) (HCC)    History of bone cancer    HLD (hyperlipidemia)    Hypertension    Leg pain    Muscle strain of shoulder region    OSA (obstructive sleep apnea)    Overweight    Prostate cancer (HCC)    PSA elevation    Shoulder pain    Past Surgical History:  Procedure Laterality Date   APPENDECTOMY     CORONARY PRESSURE/FFR STUDY N/A 04/18/2021   Procedure: INTRAVASCULAR PRESSURE WIRE/FFR STUDY;  Surgeon: Claudene Victory ORN, MD;  Location: [facility];  Service: Cardiovascular;  Laterality: N/A;   FACIAL NERVE SURGERY Right    LEFT HEART CATH AND CORONARY ANGIOGRAPHY N/A 04/18/2021   Procedure: LEFT HEART CATH AND CORONARY ANGIOGRAPHY;  Surgeon: Claudene Victory ORN, MD;  Location: [facility];  Service: Cardiovascular;  Laterality: N/A;   PROSTATE BIOPSY     Family History  Problem Relation Age of Onset   Cancer Mother    Cancer Father    Hypertension Sister    Hypertension Brother    Heart attack Neg Hx    Breast cancer Neg Hx    Prostate cancer Neg Hx    Colon cancer Neg Hx    Pancreatic cancer Neg Hx    Outpatient Medications Prior to Visit  Medication Sig Dispense Refill   amLODipine  (NORVASC ) 10 MG  tablet Take 10 mg by mouth in the morning.     ammonium lactate  (AMLACTIN DAILY) 12 % lotion Apply 1 Application topically as needed for dry skin. 400 g 0   ammonium lactate  (LAC-HYDRIN ) 12 % lotion Apply 1 Application topically as needed for dry skin. 400 g 5   aspirin  EC 81 MG tablet Take 81 mg by mouth 2 (two) times a week.     ciclopirox  (LOPROX ) 0.77 % cream Apply to both feet and between toes bid x 6 weeks. 60 g 1   cyclobenzaprine (FLEXERIL) 10 MG tablet Take 10 mg by mouth 2 (two) times daily as needed for muscle spasms.     diclofenac  sodium (VOLTAREN ) 1 % GEL Apply 4 g  topically 4 (four) times daily. 100 g 11   diclofenac  Sodium (VOLTAREN ) 1 % GEL Apply 2 g topically 4 (four) times daily. 50 g 0   finasteride (PROSCAR) 5 MG tablet Take 5 mg by mouth at bedtime.     glucose blood (TRUE METRIX PRO BLOOD GLUCOSE) test strip 1 each by Other route as needed for other. Use as instructed     isosorbide mononitrate (IMDUR) 30 MG 24 hr tablet Take 30 mg by mouth every morning.     Lancets Thin MISC by Does not apply route. 23 gauge     lisinopril  (PRINIVIL ,ZESTRIL ) 40 MG tablet TAKE ONE TABLET BY MOUTH ONCE DAILY 90 tablet 3   metoprolol  succinate (TOPROL  XL) 25 MG 24 hr tablet Take 1 tablet (25 mg total) by mouth daily. 90 tablet 3   Semaglutide ,0.25 or 0.5MG /DOS, (OZEMPIC , 0.25 OR 0.5 MG/DOSE,) 2 MG/3ML SOPN Inject 0.25 mg into the skin once a week. 3 mL 1   No facility-administered medications prior to visit.   Allergies[1]   ROS: A complete ROS was performed with pertinent positives/negatives noted in the HPI. The remainder of the ROS are negative.    Objective:   Today's Vitals   10/28/24 0935  BP: (!) 176/105  Pulse: (!) 54  SpO2: 100%  Weight: 166 lb 12.8 oz (75.7 kg)  Height: 5' 9 (1.753 m)    GENERAL: Well-appearing, in NAD. Well nourished.   EYES: R eye with edema to eyelid. Able to open and close his eye. Drainage present. Redness noted to lateral portion of cornea. RESPIRATORY: Chest wall symmetrical. Respirations even and non-labored. Breath sounds clear to auscultation bilaterally.  CARDIAC: S1, S2 present, regular rate and rhythm without murmur or gallops. Peripheral pulses 2+ bilaterally.  NEUROLOGIC: No motor or sensory deficits. Steady, even gait. C2-C12 intact.  PSYCH/MENTAL STATUS: Alert, oriented x 3. Cooperative, appropriate mood and affect.   Visual Acuity:  R eye: 20/30 L eye: 20/50    Assessment & Plan:  1. Pain and swelling of eyelid of right eye (Primary) Discussed with patient my inability to perform a thorough exam due  to not having a wood's lamp in office. 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 Pt verbalized understanding with no further questions. Escorted with security to ED. Patient in NAD at time being seen.   Return if symptoms worsen or fail to improve.    Patient to reach out to office if new, worrisome, or unresolved symptoms arise or if no improvement in patient's condition. Patient verbalized understanding and is agreeable to treatment plan. All questions answered to patient's satisfaction.  Lauraine Almarie Angus DNP, FNP-C       [1]  Allergies Allergen Reactions   Hydralazine  Hcl     Pt experienced severe cramps and aches in hands and legs when dose increased from 25 TID to 50 TID. Resolved upon discontinuation of drug.   Sulfa Antibiotics Rash   "

## 2024-10-28 NOTE — Patient Instructions (Signed)
 Please go to the ER for further evaluation of your eye swelling.

## 2024-10-28 NOTE — Discharge Instructions (Addendum)
 Thank you for visiting the Emergency Department today. It was a pleasure to be part of your healthcare team.   Your were seen today for eye pain  As discussed, please use antibiotic ointment and artificial tears for the next 3 days  - you should take your medications as directed. If you have any questions about your medicines, please call your pharmacy or healthcare provider.  It is important to watch for warning signs such as worsening pain or vision problems. If any of these happen, return to the Emergency Department or call 911.  Please follow up with your primary care provider within 3 days.   Thank you for trusting us  with your health.

## 2024-11-03 ENCOUNTER — Telehealth (HOSPITAL_BASED_OUTPATIENT_CLINIC_OR_DEPARTMENT_OTHER): Payer: Self-pay

## 2024-11-03 NOTE — Telephone Encounter (Signed)
 Called patient to confirm whether or not he is taking Januvia 100 mg. Per Dr. [place], if patient is taking then he needs to discontinue due to already taking Ozempic . Will attempt to call again at later time.

## 2024-11-13 NOTE — Telephone Encounter (Signed)
 Unable to reach. Left voice mail requesting a return call.

## 2024-11-13 NOTE — Telephone Encounter (Signed)
 The patient called in returning a call to Rexene and I called the CAL 5 times with no answer. Please assist patient further

## 2024-11-16 ENCOUNTER — Ambulatory Visit (HOSPITAL_BASED_OUTPATIENT_CLINIC_OR_DEPARTMENT_OTHER): Admitting: Family Medicine

## 2024-11-17 ENCOUNTER — Telehealth (HOSPITAL_BASED_OUTPATIENT_CLINIC_OR_DEPARTMENT_OTHER): Payer: Self-pay | Admitting: *Deleted

## 2024-11-17 DIAGNOSIS — E1165 Type 2 diabetes mellitus with hyperglycemia: Secondary | ICD-10-CM

## 2024-11-17 NOTE — Telephone Encounter (Signed)
 Routing to PCP for review.

## 2024-11-17 NOTE — Telephone Encounter (Signed)
 Copied from CRM #[id number]. Topic: Clinical - Prescription Issue >> Nov 17, 2024 12:47 PM Zebedee SAUNDERS wrote: Reason for CRM: Received call from [facility] Phone: [phone] Fax: [phone],0.25 or 0.5MG /DOS, (OZEMPIC , 0.25 OR 0.5 MG/DOSE,) 2 MG/3ML SOPN , they need to know is it for 0.25 or 0.5MG /DOS which one? Please call or fax.

## 2024-11-20 NOTE — Telephone Encounter (Signed)
 Patient is requesting an increase on dosage. Please advise.

## 2024-11-20 NOTE — Telephone Encounter (Signed)
 Copied from CRM [id number]. Topic: Clinical - Medication Question >> Nov 19, 2024  6:03 PM Nathanel BROCKS wrote: Reason for CRM: [facility] [phone]  Semaglutide ,0.25 or 0.5MG /DOS, (OZEMPIC , 0.25 OR 0.5 MG/DOSE,) 2 MG/3ML SOPN  Already filled the 0.25 on the refill should it be the 0.5 mg. Please call and advise on this rx.

## 2024-11-21 ENCOUNTER — Telehealth (HOSPITAL_BASED_OUTPATIENT_CLINIC_OR_DEPARTMENT_OTHER): Payer: Self-pay | Admitting: Family Medicine

## 2024-11-21 NOTE — Telephone Encounter (Signed)
 Copied from CRM [id number]. Topic: Clinical - Medication Question >> Nov 20, 2024  4:58 PM Rea ORN wrote: Yoojin called to follow upon this request regarding Ozempic . I advised message was sent to PCP and is still pending. She said she will call back on Monday.

## 2024-11-24 NOTE — Telephone Encounter (Signed)
 Message is following up on dosage increase on Ozempic .

## 2024-11-25 MED ORDER — OZEMPIC (0.25 OR 0.5 MG/DOSE) 2 MG/3ML ~~LOC~~ SOPN: 0.5000 mg | PEN_INJECTOR | SUBCUTANEOUS | 1 refills | Status: AC

## 2024-12-08 ENCOUNTER — Ambulatory Visit (HOSPITAL_BASED_OUTPATIENT_CLINIC_OR_DEPARTMENT_OTHER): Admitting: Family Medicine
